# Patient Record
Sex: Male | Born: 1980 | Race: Black or African American | Hispanic: No | Marital: Single | Smoking: Never smoker
Health system: Southern US, Community
[De-identification: ages and names within clinical notes are randomized; demographics above are authoritative.]

## PROBLEM LIST (undated history)

## (undated) ENCOUNTER — Emergency Department (HOSPITAL_COMMUNITY): Admission: EM | Discharge status: Absent without leave | Payer: Self-pay | Source: Home / Self Care

---
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
## 2018-09-06 ENCOUNTER — Other Ambulatory Visit: Payer: Self-pay | Admitting: Internal Medicine

## 2018-09-06 DIAGNOSIS — E786 Lipoprotein deficiency: Secondary | ICD-10-CM

## 2018-09-06 DIAGNOSIS — Z0000 Encounter for general adult medical examination without abnormal findings: Secondary | ICD-10-CM

## 2018-10-03 ENCOUNTER — Ambulatory Visit (INDEPENDENT_AMBULATORY_CARE_PROVIDER_SITE_OTHER): Payer: Self-pay

## 2018-10-03 ENCOUNTER — Encounter (HOSPITAL_COMMUNITY): Payer: Self-pay

## 2018-10-03 ENCOUNTER — Ambulatory Visit (HOSPITAL_COMMUNITY)
Admission: EM | Admit: 2018-10-03 | Discharge: 2018-10-03 | Disposition: A | Discharge status: Home / Self Care | Payer: Self-pay | Attending: Emergency Medicine | Admitting: Emergency Medicine

## 2018-10-03 DIAGNOSIS — S6992XA Unspecified injury of left wrist, hand and finger(s), initial encounter: Secondary | ICD-10-CM

## 2018-10-03 MED ORDER — IBUPROFEN 800 MG PO TABS: 800.0000 mg | ORAL_TABLET | Freq: Three times a day (TID) | ORAL | 0 refills | Status: DC | PRN

## 2018-10-03 MED ORDER — BACITRACIN ZINC 500 UNIT/GM EX OINT
TOPICAL_OINTMENT | CUTANEOUS | Status: AC
  Filled 2018-10-03: qty 28.35

## 2018-10-03 NOTE — ED Triage Notes (Signed)
Patient reports he was palying Frisbee and he injured the 4th and 5th digit of his left hand.

## 2018-10-03 NOTE — Discharge Instructions (Signed)
Take ibuprofen as needed for your discomfort.    Keep your wound clean and dry.  Wash it gently twice a day with soap and water.  Apply an antibiotic ointment and a bandage.    Call the orthopedic listed to schedule an appointment for evaluation of your fingers.

## 2018-10-03 NOTE — ED Provider Notes (Signed)
[facility]    CSN: [id number] Arrival date & time: 10/03/18  1830      History   Chief Complaint Chief Complaint  Patient presents with  . Finger Injury    HPI Nathaniel Hanson is a 38 y.o. male.   Patient presents with injury to his left fourth and fifth fingers which occurred while playing Frisbee this afternoon.  He states he fell and landed on his fingers.  He has a previous injury to the left fifth finger which occurred in March 2020 and left his DIP joint deformed.  He denies numbness, tingling, weakness in his fingers.  He denies head injury or loss of consciousness.  The history is provided by the patient.    History reviewed. No pertinent past medical history.  There are no active problems to display for this patient.   History reviewed. No pertinent surgical history.     Home Medications    Prior to Admission medications   Medication Sig Start Date End Date Taking? Authorizing Provider  ibuprofen (ADVIL) 800 MG tablet Take 1 tablet (800 mg total) by mouth every 8 (eight) hours as needed. 10/03/18   Sharion Balloon, NP    Family History Family History  Problem Relation Age of Onset  . Cancer Mother     Social History Social History   Tobacco Use  . Smoking status: Never Smoker  . Smokeless tobacco: Never Used  Substance Use Topics  . Alcohol use: Not on file  . Drug use: Not on file     Allergies   Patient has no allergy information on record.   Review of Systems Review of Systems  Constitutional: Negative for chills and fever.  HENT: Negative for ear pain and sore throat.   Eyes: Negative for pain and visual disturbance.  Respiratory: Negative for cough and shortness of breath.   Cardiovascular: Negative for chest pain and palpitations.  Gastrointestinal: Negative for abdominal pain and vomiting.  Genitourinary: Negative for dysuria and hematuria.  Musculoskeletal: Positive for arthralgias. Negative for back pain.  Skin: Positive  for wound. Negative for color change and rash.  Neurological: Negative for seizures, syncope, weakness and numbness.  All other systems reviewed and are negative.    Physical Exam Triage Vital Signs ED Triage Vitals  Enc Vitals Group     BP 10/03/18 1849 119/82     Pulse Rate 10/03/18 1849 98     Resp 10/03/18 1849 16     Temp 10/03/18 1849 (!) 97.5 F (36.4 C)     Temp Source 10/03/18 1849 Temporal     SpO2 10/03/18 1849 95 %     Weight --      Height --      Head Circumference --      Peak Flow --      Pain Score 10/03/18 1846 5     Pain Loc --      Pain Edu? --      Excl. in [place]? --    No data found.  Updated Vital Signs BP 119/82 (BP Location: Right Arm)   Pulse 98   Temp (!) 97.5 F (36.4 C) (Temporal)   Resp 16   SpO2 95%   Visual Acuity Right Eye Distance:   Left Eye Distance:   Bilateral Distance:    Right Eye Near:   Left Eye Near:    Bilateral Near:     Physical Exam Vitals signs and nursing note reviewed.  Constitutional:  Appearance: He is well-developed.  HENT:     Head: Normocephalic and atraumatic.  Eyes:     Conjunctiva/sclera: Conjunctivae normal.  Neck:     Musculoskeletal: Neck supple.  Cardiovascular:     Rate and Rhythm: Normal rate and regular rhythm.     Heart sounds: No murmur.  Pulmonary:     Effort: Pulmonary effort is normal. No respiratory distress.     Breath sounds: Normal breath sounds.  Abdominal:     Palpations: Abdomen is soft.     Tenderness: There is no abdominal tenderness.  Musculoskeletal:        General: Tenderness and deformity present.     Comments: Deformity of left 5th finger DIP which patient states is from previous injury.  See picture for details.   Skin:    General: Skin is warm and dry.     Capillary Refill: Capillary refill takes less than 2 seconds.  Neurological:     General: No focal deficit present.     Mental Status: He is alert and oriented to person, place, and time.     Sensory: No  sensory deficit.          UC Treatments / Results  Labs (all labs ordered are listed, but only abnormal results are displayed) Labs Reviewed - No data to display  EKG   Radiology No results found.  Procedures Procedures (including critical care time)  Medications Ordered in UC Medications - No data to display  Initial Impression / Assessment and Plan / UC Course  I have reviewed the triage vital signs and the nursing notes.  Pertinent labs & imaging results that were available during my care of the patient were reviewed by me and considered in my medical decision making (see chart for details).    Injury of the left fourth and fifth fingers.  X-ray of the left hand showed no new dislocations or fractures.  Treating with ibuprofen.  Discussed wound care and signs of infection with patient.  Instructed patient to call orthopedics to schedule appointment for evaluation.  Patient agrees with plan of care.     Final Clinical Impressions(s) / UC Diagnoses   Final diagnoses:  Injury of finger of left hand, initial encounter     Discharge Instructions     Take ibuprofen as needed for your discomfort.    Keep your wound clean and dry.  Wash it gently twice a day with soap and water.  Apply an antibiotic ointment and a bandage.    Call the orthopedic listed to schedule an appointment for evaluation of your fingers.        ED Prescriptions    Medication Sig Dispense Auth. Provider   ibuprofen (ADVIL) 800 MG tablet Take 1 tablet (800 mg total) by mouth every 8 (eight) hours as needed. 21 tablet Mickie Bailate, Miqueas Whilden H, NP     Controlled Substance Prescriptions [place] Controlled Substance Registry consulted? Not Applicable   Mickie Bailate, Soren Pigman H, NP 10/03/18 [phone]

## 2018-10-08 ENCOUNTER — Other Ambulatory Visit: Payer: Self-pay

## 2018-10-08 ENCOUNTER — Encounter: Payer: Self-pay | Admitting: Physician Assistant

## 2018-10-08 ENCOUNTER — Ambulatory Visit (INDEPENDENT_AMBULATORY_CARE_PROVIDER_SITE_OTHER): Payer: Self-pay | Admitting: Physician Assistant

## 2018-10-08 VITALS — Ht 74.0 in | Wt 254.0 lb

## 2018-10-08 DIAGNOSIS — M79645 Pain in left finger(s): Secondary | ICD-10-CM

## 2018-10-08 DIAGNOSIS — M20012 Mallet finger of left finger(s): Secondary | ICD-10-CM

## 2018-10-08 NOTE — Progress Notes (Signed)
   Office Visit Note   Patient: Nathaniel Hanson           Date of Birth: April 30, 1980           MRN: [id number] Visit Date: 10/08/2018              Requested by: No referring provider defined for this encounter. PCP: Patient, No Pcp Per   Assessment & Plan: Visit Diagnoses:  1. Finger pain, left   2. Mallet deformity of left little finger     Plan: We will place in an splint which he is to keep on except for hygiene purposes.  During the washing of the hand he needs to keep the finger straight using his other hand if wrist and down against surface.  Will refer him to a hand surgeon here in town to see what can be done for his left chronic mallet finger.  Questions were encouraged and answered.  Follow-Up Instructions: Return if symptoms worsen or fail to improve.   Orders:  Orders Placed This Encounter  Procedures  . Ambulatory referral to Hand Surgery   No orders of the defined types were placed in this encounter.     Procedures: No procedures performed   Clinical Data: No additional findings.   Subjective: Chief Complaint  Patient presents with  . Left Hand - Injury    Initial injury in March 2020    HPI Nathaniel Hanson comes in today due to left fifth finger injury.  He states he injured his finger when he caught a ball without a glove in March and injured his finger.  He states that the finger healed in a bent position.  He was unable to seek medical attention for it due to the COVID-19 outbreak.  He has been this mainly dealing with the finger until he was playing [employer] this past Wednesday and reinjured it.  He went to [facility] urgent care where radiographs of the finger were obtained on 10/03/2018.  I personally reviewed these.  Instructed no acute fracture noted apparent bony injury.  Review of Systems Negative for fevers chills.  See HPI otherwise negative.  Objective: Vital Signs: Ht 6\' 2"  (1.88 m)   Wt 254 lb (115.2 kg)   BMI 32.61 kg/m   Physical Exam General:  Well-developed well-nourished pleasant male in no acute distress. Psych: Alert and oriented x3 Ortho Exam Left hand is range of motion of the fingers except for the left fifth finger but she has flexion contracture consistent with a mallet finger and inability to bring the finger out the full extension through the DIP joint.  No malrotation.  Passively I can bring the left fifth finger to full extension.  He has minimal discomfort.  Some skin abrasion about the nail but no apparent infection. Specialty Comments:  No specialty comments available.  Imaging: No results found.   PMFS History: There are no active problems to display for this patient.  History reviewed. No pertinent past medical history.  Family History  Problem Relation Age of Onset  . Cancer Mother     History reviewed. No pertinent surgical history. Social History   Occupational History  . Not on file  Tobacco Use  . Smoking status: Never Smoker  . Smokeless tobacco: Never Used  Substance and Sexual Activity  . Alcohol use: Not on file  . Drug use: Not on file  . Sexual activity: Not on file

## 2018-10-18 ENCOUNTER — Other Ambulatory Visit: Payer: Self-pay

## 2019-05-29 ENCOUNTER — Ambulatory Visit (HOSPITAL_COMMUNITY)
Admission: RE | Admit: 2019-05-29 | Discharge: 2019-05-29 | Disposition: A | Discharge status: Home / Self Care | Payer: Self-pay | Source: Ambulatory Visit | Attending: Family Medicine | Admitting: Family Medicine

## 2019-05-29 ENCOUNTER — Ambulatory Visit (HOSPITAL_COMMUNITY)
Admission: EM | Admit: 2019-05-29 | Discharge: 2019-05-29 | Disposition: A | Discharge status: Home / Self Care | Payer: Self-pay | Attending: Family Medicine | Admitting: Family Medicine

## 2019-05-29 ENCOUNTER — Other Ambulatory Visit: Payer: Self-pay

## 2019-05-29 ENCOUNTER — Encounter (HOSPITAL_COMMUNITY): Payer: Self-pay

## 2019-05-29 DIAGNOSIS — M79609 Pain in unspecified limb: Secondary | ICD-10-CM

## 2019-05-29 DIAGNOSIS — M7989 Other specified soft tissue disorders: Secondary | ICD-10-CM | POA: Insufficient documentation

## 2019-05-29 DIAGNOSIS — M79662 Pain in left lower leg: Secondary | ICD-10-CM | POA: Insufficient documentation

## 2019-05-29 NOTE — Progress Notes (Signed)
Left lower extremity venous duplex completed. Refer to "CV Proc" under chart review to view preliminary results.  05/29/2019 12:19 PM Eula Fried., MHA, RVT, RDCS, RDMS

## 2019-05-29 NOTE — ED Provider Notes (Signed)
[place]    CSN: [id number] Arrival date & time: 05/29/19  2458      History   Chief Complaint Chief Complaint  Patient presents with  . Leg Pain    HPI Nathaniel Hanson is a 39 y.o. male.   Patient is a 39 year old male with no significant past medical history.  He presents today with left leg pain, swelling and calf area.  The pain radiates into posterior knee and up to posterior thigh.  Started approximately 2 weeks ago after playing basketball.  Denies any specific injury at that time.  Has been trying to rest but the pain is not improving and the swelling is worsening.  There is some generalized erythema to the calf area.  No history of DVT, PE.  No recent traveling.  No fever, chills, body aches or night sweats.  Had Covid in March.  No recent vaccinations.  Has not been taking any medication for symptoms.  ROS per HPI      History reviewed. No pertinent past medical history.  There are no problems to display for this patient.   History reviewed. No pertinent surgical history.     Home Medications    Prior to Admission medications   Medication Sig Start Date End Date Taking? Authorizing Provider  ibuprofen (ADVIL) 800 MG tablet Take 1 tablet (800 mg total) by mouth every 8 (eight) hours as needed. 10/03/18   Sharion Balloon, NP    Family History Family History  Problem Relation Age of Onset  . Cancer Mother     Social History Social History   Tobacco Use  . Smoking status: Never Smoker  . Smokeless tobacco: Never Used  Substance Use Topics  . Alcohol use: Not Currently  . Drug use: Not on file     Allergies   Patient has no known allergies.   Review of Systems Review of Systems   Physical Exam Triage Vital Signs ED Triage Vitals  Enc Vitals Group     BP 05/29/19 1001 101/71     Pulse Rate 05/29/19 1001 86     Resp 05/29/19 1001 14     Temp 05/29/19 1001 98.4 F (36.9 C)     Temp Source 05/29/19 1001 Oral     SpO2 05/29/19  1001 99 %     Weight --      Height --      Head Circumference --      Peak Flow --      Pain Score 05/29/19 1000 8     Pain Loc --      Pain Edu? --      Excl. in [place]? --    No data found.  Updated Vital Signs BP 101/71 (BP Location: Left Arm)   Pulse 86   Temp 98.4 F (36.9 C) (Oral)   Resp 14   SpO2 99%   Visual Acuity Right Eye Distance:   Left Eye Distance:   Bilateral Distance:    Right Eye Near:   Left Eye Near:    Bilateral Near:     Physical Exam Vitals and nursing note reviewed.  Constitutional:      Appearance: Normal appearance.  HENT:     Head: Normocephalic and atraumatic.     Nose: Nose normal.  Eyes:     Conjunctiva/sclera: Conjunctivae normal.  Pulmonary:     Effort: Pulmonary effort is normal.  Musculoskeletal:        General: Normal range of motion.  Cervical back: Normal range of motion.     Left lower leg: Edema present.     Comments: Moderate swelling, erythema and very tender to palpation of left lower extremity/calf area and posterior knee.   Skin:    General: Skin is warm and dry.  Neurological:     Mental Status: He is alert.  Psychiatric:        Mood and Affect: Mood normal.      UC Treatments / Results  Labs (all labs ordered are listed, but only abnormal results are displayed) Labs Reviewed - No data to display  EKG   Radiology LE VENOUS  Result Date: 05/29/2019  Lower Venous DVTStudy Indications: Swelling, and Pain.  Performing Technologist: Gertie Fey MHA, RDMS, RVT, RDCS  Examination Guidelines: A complete evaluation includes B-mode imaging, spectral Doppler, color Doppler, and power Doppler as needed of all accessible portions of each vessel. Bilateral testing is considered an integral part of a complete examination. Limited examinations for reoccurring indications may be performed as noted. The reflux portion of the exam is performed with the patient in reverse Trendelenburg.   +-----+---------------+---------+-----------+----------+--------------+ RIGHTCompressibilityPhasicitySpontaneityPropertiesThrombus Aging +-----+---------------+---------+-----------+----------+--------------+ CFV  Full           Yes      Yes                                 +-----+---------------+---------+-----------+----------+--------------+   +---------+---------------+---------+-----------+----------+--------------+ LEFT     CompressibilityPhasicitySpontaneityPropertiesThrombus Aging +---------+---------------+---------+-----------+----------+--------------+ CFV      Full           Yes      Yes                                 +---------+---------------+---------+-----------+----------+--------------+ SFJ      Full                                                        +---------+---------------+---------+-----------+----------+--------------+ FV Prox  Full                                                        +---------+---------------+---------+-----------+----------+--------------+ FV Mid   Full                                                        +---------+---------------+---------+-----------+----------+--------------+ FV DistalFull                                                        +---------+---------------+---------+-----------+----------+--------------+ PFV      Full                                                        +---------+---------------+---------+-----------+----------+--------------+  POP      Full           Yes      Yes                                 +---------+---------------+---------+-----------+----------+--------------+ PTV      Full                                                        +---------+---------------+---------+-----------+----------+--------------+ PERO     Full                                                         +---------+---------------+---------+-----------+----------+--------------+     Summary: RIGHT: - No evidence of common femoral vein obstruction.  LEFT: - There is no evidence of deep vein thrombosis in the lower extremity.  - No cystic structure found in the popliteal fossa.  *See table(s) above for measurements and observations.    Preliminary     Procedures Procedures (including critical care time)  Medications Ordered in UC Medications - No data to display  Initial Impression / Assessment and Plan / UC Course  I have reviewed the triage vital signs and the nursing notes.  Pertinent labs & imaging results that were available during my care of the patient were reviewed by me and considered in my medical decision making (see chart for details).     Left calf pain and swelling There is some concern for DVT today.  Sending to hospital for ultrasound to rule out DVT  DVT study negative. Patient aware of results. We will go with previous plan as discussed with patient what if results were negative.  Final Clinical Impressions(s) / UC Diagnoses   Final diagnoses:  Pain of left calf     Discharge Instructions     Please go for an ultrasound across the street at the hospital.  You would go to entrance see [facility] and check in. They will call me with report and we will treat as needed If the testing is negative for a blood clot I am giving you a contact for [facility] for you to follow-up with them.  You will need to call them today If this is truly a torn calf you will need to place an Ace wrap on the calf area and rest, ice the area and take ibuprofen for pain as needed. If this is a blood clot you will need to be started on blood thinners.  We will direct you for what to do after your test is done    ED Prescriptions    None     PDMP not reviewed this encounter.   Janace Aris, NP 05/29/19 1341

## 2019-05-29 NOTE — ED Triage Notes (Signed)
C/o left leg pain. Reports he was playing basketball two weeks ago when the pain started.

## 2019-05-29 NOTE — Discharge Instructions (Addendum)
Please go for an ultrasound across the street at the hospital.  You would go to entrance see [facility] and check in. They will call me with report and we will treat as needed If the testing is negative for a blood clot I am giving you a contact for [facility] for you to follow-up with them.  You will need to call them today If this is truly a torn calf you will need to place an Ace wrap on the calf area and rest, ice the area and take ibuprofen for pain as needed. If this is a blood clot you will need to be started on blood thinners.  We will direct you for what to do after your test is done

## 2019-05-31 ENCOUNTER — Encounter: Payer: Self-pay | Admitting: Sports Medicine

## 2019-05-31 ENCOUNTER — Other Ambulatory Visit: Payer: Self-pay

## 2019-05-31 ENCOUNTER — Ambulatory Visit (INDEPENDENT_AMBULATORY_CARE_PROVIDER_SITE_OTHER): Payer: Self-pay | Admitting: Sports Medicine

## 2019-05-31 VITALS — BP 112/72 | Ht 74.0 in

## 2019-05-31 DIAGNOSIS — S86119A Strain of other muscle(s) and tendon(s) of posterior muscle group at lower leg level, unspecified leg, initial encounter: Secondary | ICD-10-CM | POA: Insufficient documentation

## 2019-05-31 DIAGNOSIS — S86112A Strain of other muscle(s) and tendon(s) of posterior muscle group at lower leg level, left leg, initial encounter: Secondary | ICD-10-CM

## 2019-05-31 NOTE — Assessment & Plan Note (Addendum)
Patient presenting with left medial gastrocnemius strain with partial rupture.  -OTC NSAIDs -Note for work -Compression sleeve  -F/u in two weeks

## 2019-05-31 NOTE — Progress Notes (Addendum)
    SUBJECTIVE:   CHIEF COMPLAINT / HPI:   Patient presents with 2 weeks of left calf, posterior knee pain. He was playing basketball when he felt a "cramp" in his left leg. Does not know of a specific time of injury. Pain is worse with flexion of the knee. Patient was seen in urgent care approximately 2 days ago where he was evaluated for DVT which was negative. Patient has been taking OTC NSAIDs for relief, but they do not help very much with the pain. Patient works as a Theme park manager at [employer].   OBJECTIVE:   BP 112/72   Ht 6\' 2"  (1.88 m)   BMI 32.61 kg/m   Left leg Inspection: No gross effusion Palpation: Tender to palpation along the left lateral hamstring lateral knee ranging down to proximal one third of calf ROM: 0 to 130 degrees Strength: 5/5 right lower extremity throughout, 4/5 left knee flexion, limited let plantar flexion Stability: Joint stable without gross laxity Special tests: Negative valgus/varus, negative McMurray's, negative Lachman's, negative anterior/posterior drawer Vascular studies:NVI   ASSESSMENT/PLAN:   Gastrocnemius muscle rupture Patient presenting with left medial gastrocnemius strain with partial rupture.  -OTC NSAIDs -Note for work -Compression sleeve  -F/u in two weeks     , MD [facility]   I was the preceptor for this visit and available for immediate consultation [facility], DO

## 2019-05-31 NOTE — Patient Instructions (Signed)
The pain in your leg is caused by a tear to your calf muscle. -Use the crutches as needed for the next several weeks as you may need them to help with walking due to the pain -Use the compression sleeve with activity.  This will help with the swelling and some discomfort you are having -You may take over-the-counter anti-inflammatories as needed for pain -I have given you a note to be off of work for 2 weeks.  I would like to see you back in 2 weeks to repeat your evaluation.  If your pain improved significantly before then and you would like to try to go back to work, please give our office a call and we can give you another note

## 2019-06-12 ENCOUNTER — Ambulatory Visit (INDEPENDENT_AMBULATORY_CARE_PROVIDER_SITE_OTHER): Payer: Self-pay | Admitting: Sports Medicine

## 2019-06-12 ENCOUNTER — Encounter: Payer: Self-pay | Admitting: Sports Medicine

## 2019-06-12 ENCOUNTER — Other Ambulatory Visit: Payer: Self-pay

## 2019-06-12 VITALS — BP 128/74 | Ht 75.0 in | Wt 220.0 lb

## 2019-06-12 DIAGNOSIS — S86112D Strain of other muscle(s) and tendon(s) of posterior muscle group at lower leg level, left leg, subsequent encounter: Secondary | ICD-10-CM

## 2019-06-12 NOTE — Progress Notes (Addendum)
PCP: Creola Corn, MD  Subjective:   HPI: Patient is a 39 y.o. male here for follow-up of left medial gastroc muscle partial tear.  Patient is now about 2 and half weeks out from the injury.  Ultrasound his last visit showed partial tearing of the medial gastroc muscle belly.  Patient notes slight improvement since his last visit.  He is now down to using 1 crutch from 2 crutches.  The compression sleeve that we tried to fit him for the last visit did not fit him very well so he has not been wearing one.  Patient notes he started going back to work this week.  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  Work is very difficult but he would like to try to power through it if he can not miss any additional days of work.  The pain does not radiate.  She denies any numbness or tingling.  He has some swelling but no bruising.   Review of Systems: See HPI above.  History reviewed. No pertinent past medical history.  Current Outpatient Medications on File Prior to Visit  Medication Sig Dispense Refill  . ibuprofen (ADVIL) 800 MG tablet Take 1 tablet (800 mg total) by mouth every 8 (eight) hours as needed. 21 tablet 0   No current facility-administered medications on file prior to visit.    History reviewed. No pertinent surgical history.  No Known Allergies  Social History   Socioeconomic History  . Marital status: Single    Spouse name: Not on file  . Number of children: Not on file  . Years of education: Not on file  . Highest education level: Not on file  Occupational History  . Not on file  Tobacco Use  . Smoking status: Never Smoker  . Smokeless tobacco: Never Used  Substance and Sexual Activity  . Alcohol use: Not Currently  . Drug use: Not on file  . Sexual activity: Not on file  Other Topics Concern  . Not on file  Social History Narrative  . Not on file   Social Determinants of Health   Financial Resource Strain:   . Difficulty  of Paying Living Expenses:   Food Insecurity:   . Worried About Programme researcher, broadcasting/film/video in the Last Year:   . Barista in the Last Year:   Transportation Needs:   . Freight forwarder (Medical):   Marland Kitchen Lack of Transportation (Non-Medical):   Physical Activity:   . Days of Exercise per Week:   . Minutes of Exercise per Session:   Stress:   . Feeling of Stress :   Social Connections:   . Frequency of Communication with Friends and Family:   . Frequency of Social Gatherings with Friends and Family:   . Attends Religious Services:   . Active Member of Clubs or Organizations:   . Attends Banker Meetings:   Marland Kitchen Marital Status:   Intimate Partner Violence:   . Fear of Current or Ex-Partner:   . Emotionally Abused:   Marland Kitchen Physically Abused:   . Sexually Abused:     Family History  Problem Relation Age of Onset  . Cancer Mother         Objective:  Physical Exam: BP 128/74   Ht 6\' 3"  (1.905 m)   Wt 220 lb (99.8 kg)   BMI 27.50 kg/m  Gen: NAD, comfortable in exam room Lungs: Breathing comfortably  on room air Left lower extremity -No bruising, deformity, mild swelling at the medial head of the gastroc -Mild tenderness palpation of the medial head of the gastroc -Normal range of motion of the ankle in all planes -Normal strength of the ankle in all directions.  Some pain with plantarflexion -Left lower extremity neurovascularly intact   Assessment & Plan:  Patient is a 39 y.o. male here for follow-up of left medial gastroc tear  1.  Partial tear of the left medial gastroc -Patient doing well since his last visit.  Still having pain however this is improving.  He is now down to 1 crutch from previously using 2 crutches -Patient was offered a work note to excuse him from work the remainder of the week however he states he would like to power through this week and not take additional time off. -Patient given concentric strengthening exercises -Patient advised that  he could try to find a compression sleeve off of [place] by [employer] or get one at the pharmacy if you would like.  Unfortunately the body helix compression sleeves did not work well for him  Patient will follow up in 3 weeks for repeat evaluation.  We will likely add eccentric strengthening at that time if he is able.  Addendum:  I was the preceptor for this visit and available for immediate consultation.  Karlton Lemon MD [facility] Boys

## 2019-06-12 NOTE — Patient Instructions (Signed)
The pain in your calf is caused by a partial tear of your calf muscle. -Work on the strengthening exercises shown to you at today's visit -If you would like to get a compression sleeve can order one off of [employer] to get one from the pharmacy.  Unfortunately ours did not seem to fit you very well -You may use the crutches as needed.  I would like you to try to wean off of them over the next several weeks -I will see back in about 3 weeks.  At that time we will reevaluate your symptoms and adjust your exercises at that time.  In the meantime if you find you are not able to do your job and you need a note for work, please not hesitate to contact me.

## 2019-06-18 ENCOUNTER — Other Ambulatory Visit: Payer: Self-pay

## 2019-06-18 ENCOUNTER — Encounter (HOSPITAL_COMMUNITY): Payer: Self-pay

## 2019-06-18 ENCOUNTER — Emergency Department (HOSPITAL_COMMUNITY)
Admission: EM | Admit: 2019-06-18 | Discharge: 2019-06-19 | Disposition: A | Discharge status: Home / Self Care | Payer: Self-pay | Attending: Emergency Medicine | Admitting: Emergency Medicine

## 2019-06-18 DIAGNOSIS — M25552 Pain in left hip: Secondary | ICD-10-CM | POA: Insufficient documentation

## 2019-06-18 DIAGNOSIS — Z79899 Other long term (current) drug therapy: Secondary | ICD-10-CM | POA: Insufficient documentation

## 2019-06-18 DIAGNOSIS — T148XXA Other injury of unspecified body region, initial encounter: Secondary | ICD-10-CM

## 2019-06-18 MED ORDER — OXYCODONE-ACETAMINOPHEN 5-325 MG PO TABS
1.0000 | ORAL_TABLET | ORAL | Status: DC | PRN
  Administered 2019-06-18: 1 via ORAL
  Filled 2019-06-18: qty 1

## 2019-06-18 NOTE — ED Triage Notes (Addendum)
Patient arrived stating he tore his calf on the 5/11. Reporting the pain has increased today and has some swelling. States he has been taking Advil and Ibuprofen at home with little relief. States he began having pain in his tailbone.

## 2019-06-18 NOTE — ED Provider Notes (Signed)
[facility]-EMERGENCY DEPT Provider Note   CSN: [id number] Arrival date & time: 06/18/19  2102     History Chief Complaint  Patient presents with  . Leg Pain    Nathaniel Hanson is a 39 y.o. male.  HPI He presents for evaluation of pain in the left hip region, which started yesterday.  No recent trauma.  He injured his left calf, muscle tear, 3 weeks ago.  He also strained his hamstring at that time.  This occurred while he was playing basketball, with nonspecific point of injury.  He has been seeing a sports medicine physician who has been recommending symptomatic treatment, with stretching, and OTC analgesia.  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  He did not work yesterday.  Today he was unable to do much work, because of the pain.  He denies fever, loss of bowel or bladder function, inability to ambulate or back pain.  There are no other known modifying factors.    History reviewed. No pertinent past medical history.  Patient Active Problem List   Diagnosis Date Noted  . 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 No pertinent surgical history.     Family History  Problem Relation Age of Onset  . Cancer Mother     Social History   Tobacco Use  . Smoking status: Never Smoker  . Smokeless tobacco: Never Used  Substance Use Topics  . Alcohol use: Not Currently  . Drug use: Not on file    Home Medications Prior to Admission medications   Medication Sig Start Date End Date Taking? Authorizing Provider  diazepam (VALIUM) 5 MG tablet Take 1 tablet (5 mg total) by mouth every 6 (six) hours as needed for muscle spasms (spasms). 06/19/19   Mancel Bale, MD  HYDROcodone-acetaminophen (NORCO/VICODIN) 5-325 MG tablet Take 1 tablet by mouth every 4 (four) hours as needed for moderate pain. 06/19/19   Mancel Bale, MD  ibuprofen (ADVIL) 800 MG tablet Take 1 tablet (800 mg total) by mouth every 8 (eight) hours as needed. 10/03/18   Mickie Bail, NP    Allergies    Patient has no known allergies.  Review of Systems   Review of Systems  All other systems reviewed and are negative.   Physical Exam Updated Vital Signs BP (!) 142/87 (BP Location: Right Arm)   Pulse 90   Temp 98.4 F (36.9 C) (Oral)   Resp 16   Ht 6\' 3"  (1.905 m)   Wt 99.8 kg   SpO2 100%   BMI 27.50 kg/m   Physical Exam Vitals and nursing note reviewed.  Constitutional:      General: He is in acute distress (Uncomfortable).     Appearance: He is well-developed. He is not ill-appearing, toxic-appearing or diaphoretic.  HENT:     Head: Normocephalic and atraumatic.     Right Ear: External ear normal.     Left Ear: External ear normal.  Eyes:     Conjunctiva/sclera: Conjunctivae normal.     Pupils: Pupils are equal, round, and reactive to light.  Neck:     Trachea: Phonation normal.  Cardiovascular:     Rate and Rhythm: Normal rate.  Pulmonary:     Effort: Pulmonary effort is normal.  Abdominal:     General: There is no distension.  Musculoskeletal:     Cervical back: Normal range of motion and neck supple.     Comments: He walks with a left antalgic  gait.  He is tender in the left posterior pelvic region, and slightly tender over the left hip.  There is no pain of the left hip with internal and external rotation.  No tenderness of the anterior posterior thigh regions,  Skin:    General: Skin is warm and dry.  Neurological:     Mental Status: He is alert and oriented to person, place, and time.     Cranial Nerves: No cranial nerve deficit.     Sensory: No sensory deficit.     Motor: No abnormal muscle tone.     Coordination: Coordination normal.  Psychiatric:        Mood and Affect: Mood normal.        Behavior: Behavior normal.        Thought Content: Thought content normal.        Judgment: Judgment normal.     ED Results / Procedures / Treatments   Labs (all labs ordered are listed, but only abnormal results are  displayed) Labs Reviewed - No data to display  EKG None  Radiology No results found.  Procedures Procedures (including critical care time)  Medications Ordered in ED Medications  oxyCODONE-acetaminophen (PERCOCET/ROXICET) 5-325 MG per tablet 1 tablet (1 tablet Oral Given 06/18/19 2234)  oxyCODONE-acetaminophen (PERCOCET/ROXICET) 5-325 MG per tablet 1 tablet (has no administration in time range)  diazepam (VALIUM) tablet 5 mg (has no administration in time range)    ED Course  I have reviewed the triage vital signs and the nursing notes.  Pertinent labs & imaging results that were available during my care of the patient were reviewed by me and considered in my medical decision making (see chart for details).    MDM Rules/Calculators/A&P                       Patient Vitals for the past 24 hrs:  BP Temp Temp src Pulse Resp SpO2 Height Weight  06/18/19 2358 (!) 142/87 98.4 F (36.9 C) Oral 90 -- 100 % 6\' 3"  (1.905 m) 99.8 kg  06/18/19 2204 (!) 150/78 99.7 F (37.6 C) Oral 95 16 95 % 6\' 3"  (1.905 m) 99.8 kg    12:17 AM Reevaluation with update and discussion. After initial assessment and treatment, an updated evaluation reveals no change in status, findings discussed and questions answered. 2205   Medical Decision Making:  This patient is presenting for evaluation of left hip pain, which does require a range of treatment options, and is a complaint that involves a moderate risk of morbidity and mortality. The differential diagnoses include radicular pain, muscle strain, aggravation of prior injury. I decided to review old records, and in summary healthy young male, with a injury to the left leg, 3 weeks ago, now improving calf discomfort but newly worsening left hip pain.  I did not require additional historical information from anyone with.    Critical Interventions-clinical evaluation, observation reassessment  After These Interventions, the Patient was  reevaluated and was found to have pain likely from muscle strain, versus spasm.  Doubt fracture, radiculopathy or significant joint injury.  CRITICAL CARE-no Performed by:  Nursing Notes Reviewed/ Care Coordinated Applicable Imaging Reviewed Interpretation of Laboratory Data incorporated into ED treatment  The patient appears reasonably screened and/or stabilized for discharge and I doubt any other medical condition or other [facility] requiring further screening, evaluation, or treatment in the ED at this time prior to discharge.  Plan: Home Medications-routine OTC medications  as needed; Home Treatments-ice and heat to affected area; return here if the recommended treatment, does not improve the symptoms; Recommended follow up-sports medicine, as needed     Final Clinical Impression(s) / ED Diagnoses Final diagnoses:  None    Rx / DC Orders ED Discharge Orders         Ordered    HYDROcodone-acetaminophen (NORCO/VICODIN) 5-325 MG tablet  Every 4 hours PRN     06/19/19 0016    diazepam (VALIUM) 5 MG tablet  Every 6 hours PRN     06/19/19 0016           Daleen Bo, MD 06/19/19 0018

## 2019-06-19 MED ORDER — DIAZEPAM 5 MG PO TABS
5.0000 mg | ORAL_TABLET | Freq: Once | ORAL | Status: AC
  Administered 2019-06-19: 5 mg via ORAL
  Filled 2019-06-19: qty 1

## 2019-06-19 MED ORDER — DIAZEPAM 5 MG PO TABS
5.0000 mg | ORAL_TABLET | Freq: Four times a day (QID) | ORAL | 0 refills | Status: DC | PRN
Start: 2019-06-19 — End: 2020-07-20

## 2019-06-19 MED ORDER — OXYCODONE-ACETAMINOPHEN 5-325 MG PO TABS
1.0000 | ORAL_TABLET | Freq: Once | ORAL | Status: AC
  Administered 2019-06-19: 1 via ORAL
  Filled 2019-06-19: qty 1

## 2019-06-19 MED ORDER — HYDROCODONE-ACETAMINOPHEN 5-325 MG PO TABS: 1.0000 | ORAL_TABLET | ORAL | 0 refills | Status: DC | PRN

## 2019-06-19 NOTE — Discharge Instructions (Signed)
Follow-up with your sports medicine doctor if not better in 3 or 4 days.  Use ice on the sore areas 3 or 4 times a day for 1 to 2 days after that use heat in the same pattern.  Do not drive or work when using the narcotic pain reliever or muscle relaxer.

## 2019-06-24 ENCOUNTER — Encounter: Payer: Self-pay | Admitting: Family Medicine

## 2019-06-24 ENCOUNTER — Ambulatory Visit (INDEPENDENT_AMBULATORY_CARE_PROVIDER_SITE_OTHER): Payer: Self-pay | Admitting: Family Medicine

## 2019-06-24 ENCOUNTER — Other Ambulatory Visit: Payer: Self-pay

## 2019-06-24 DIAGNOSIS — S86112A Strain of other muscle(s) and tendon(s) of posterior muscle group at lower leg level, left leg, initial encounter: Secondary | ICD-10-CM

## 2019-06-24 DIAGNOSIS — M25552 Pain in left hip: Secondary | ICD-10-CM

## 2019-06-24 NOTE — Progress Notes (Signed)
Nathaniel Hanson is a 39 y.o. male who presents to [facility] today for the following:  Left hip pain Patient reporting left hip pain which is new.  Patient reports that pain began last Tuesday.  He woke up in the morning with this pain.  Did not do anything to injure it or workout excessively the day before. Does sleep with his calf elevated secondary to his calf tear. Try to go to work.  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  Around 7 PM he cannot stand the pain and tries to lay down.  When he continues to have pain with laying down he went to the urgent care.  He was given Valium and hydrocodone which she states helped.  Has not had pain since.    F/u left calf pain Patient presenting for follow-up of left medial gastroc muscle partial tear.  Patient was first seen in the beginning of May for this.  Patient reports pain is still present especially when he stands for over 5 minutes.  Has not been using crutches but started to use them again last week secondary to his hip pain.  Has been using a compression sleeve which she started on Friday.  That helps some.  Has started to do rehab but had to stop secondary to hip pain.  PMH reviewed. Gastrocnemius muscle rupture ROS as above. Medications reviewed.  Exam:  BP 130/82   Ht 6\' 3"  (1.905 m)   Wt 218 lb (98.9 kg)   BMI 27.25 kg/m  Gen: Well NAD MSK: Hip:  - Inspection: No gross deformity, no swelling, erythema, or ecchymosis - Palpation: No TTP, specifically none over greater trochanter - ROM: Normal range of motion on Flexion, extension, abduction, internal and external rotation. Pain with flexion and extension  - Strength: Decreased adduction, flexion, extension, abduction  - Neuro/vasc: NV intact distally - Special Tests: Negative FABER and FADIR.  LLE - inspection: no bruising, deformity, edema - palpation: no TTP of medial gastroc - ROM: limited ROM in foot dorsiflexion  - strength: decreased strength  with plantarflexion  - neuro/vasc: NV intact distally   LE VENOUS  Result Date: 05/29/2019  Lower Venous DVTStudy Indications: Swelling, and Pain.  Performing Technologist: 07/29/2019 MHA, RDMS, RVT, RDCS  Examination Guidelines: A complete evaluation includes B-mode imaging, spectral Doppler, color Doppler, and power Doppler as needed of all accessible portions of each vessel. Bilateral testing is considered an integral part of a complete examination. Limited examinations for reoccurring indications may be performed as noted. The reflux portion of the exam is performed with the patient in reverse Trendelenburg.  +-----+---------------+---------+-----------+----------+--------------+ RIGHTCompressibilityPhasicitySpontaneityPropertiesThrombus Aging +-----+---------------+---------+-----------+----------+--------------+ CFV  Full           Yes      Yes                                 +-----+---------------+---------+-----------+----------+--------------+   +---------+---------------+---------+-----------+----------+--------------+ LEFT     CompressibilityPhasicitySpontaneityPropertiesThrombus Aging +---------+---------------+---------+-----------+----------+--------------+ CFV      Full           Yes      Yes                                 +---------+---------------+---------+-----------+----------+--------------+ SFJ      Full                                                        +---------+---------------+---------+-----------+----------+--------------+  FV Prox  Full                                                        +---------+---------------+---------+-----------+----------+--------------+ FV Mid   Full                                                        +---------+---------------+---------+-----------+----------+--------------+ FV DistalFull                                                         +---------+---------------+---------+-----------+----------+--------------+ PFV      Full                                                        +---------+---------------+---------+-----------+----------+--------------+ POP      Full           Yes      Yes                                 +---------+---------------+---------+-----------+----------+--------------+ PTV      Full                                                        +---------+---------------+---------+-----------+----------+--------------+ PERO     Full                                                        +---------+---------------+---------+-----------+----------+--------------+     Summary: RIGHT: - No evidence of common femoral vein obstruction.  LEFT: - There is no evidence of deep vein thrombosis in the lower extremity.  - No cystic structure found in the popliteal fossa.  *See table(s) above for measurements and observations. Electronically signed by Gretta Began MD on 05/29/2019 at 7:30:47 PM.    Final      Assessment and Plan: 1) Gastrocnemius muscle rupture Patient with continued pain after excessive standing.  Continues to have weakness with foot plantarflexion.  Anticipate pain will improve with rehab but unfortunately he was unable to do it secondary to pain.  Now that hip pain is improved hopefully he can incorporate more rehab.  Advised to continue to wear compression sleeve.  Follow-up in 4 weeks.  Left hip pain Patient with left hip pain.  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  Does have some pain and weakness with flexion and extension of hip.  Pain is on the lateral aspect of his hip. Advised strengthening exercises to avoid  exacerbation. Pain is resolved now which is re-assuring. Can use NSAIDs for pain relief PRN.    Dalphine Handing, PGY-3 [facility] Resident 06/24/2019 3:48 PM

## 2019-06-24 NOTE — Assessment & Plan Note (Signed)
Patient with continued pain after excessive standing.  Continues to have weakness with foot flexion.  Anticipate pain will improve with rehab but unfortunately he was unable to do it secondary to pain.  Now that hip pain is improved hopefully he can incorporate more rehab.  Advised to continue to wear compression sleeve.  Follow-up in 4 weeks.

## 2019-06-24 NOTE — Patient Instructions (Signed)
The pain in your calf is caused by a partial tear of your calf muscle. -Work on the strengthening exercises - these are very important -Continue with the compression sleeve  Your hip pain is consistent with IT band syndrome which has improved. Do home stretches as directed for the next 3-4 weeks and hip side raises 3 sets of 10 once a day. Follow up with [place] in about 1 month for reevaluation

## 2019-06-24 NOTE — Assessment & Plan Note (Addendum)
Patient with left hip pain.  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  Does have some pain and weakness with flexion and extension of hip.  Pain is on the lateral aspect of his hip. Advised strengthening exercises to avoid exacerbation. Pain is resolved now which is re-assuring. Can use NSAIDs for pain relief PRN.

## 2019-07-03 ENCOUNTER — Ambulatory Visit: Payer: Self-pay | Admitting: Family Medicine

## 2019-07-24 ENCOUNTER — Ambulatory Visit (INDEPENDENT_AMBULATORY_CARE_PROVIDER_SITE_OTHER): Payer: Self-pay | Admitting: Family Medicine

## 2019-07-24 ENCOUNTER — Encounter: Payer: Self-pay | Admitting: Family Medicine

## 2019-07-24 ENCOUNTER — Other Ambulatory Visit: Payer: Self-pay

## 2019-07-24 VITALS — BP 102/74 | Ht 75.0 in | Wt 220.0 lb

## 2019-07-24 DIAGNOSIS — S86812D Strain of other muscle(s) and tendon(s) at lower leg level, left leg, subsequent encounter: Secondary | ICD-10-CM

## 2019-07-24 NOTE — Progress Notes (Signed)
PCP: Creola Corn, MD  Subjective:   HPI: Patient is a 39 y.o. male here for left calf pain.  6/7: Left hip pain Patient reporting left hip pain which is new.  Patient reports that pain began last Tuesday.  He woke up in the morning with this pain.  Did not do anything to injure it or workout excessively the day before. Does sleep with his calf elevated secondary to his calf tear. Try to go to work.  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  Around 7 PM he cannot stand the pain and tries to lay down.  When he continues to have pain with laying down he went to the urgent care.  He was given Valium and hydrocodone which she states helped.  Has not had pain since.    F/u left calf pain Patient presenting for follow-up of left medial gastroc muscle partial tear.  Patient was first seen in the beginning of May for this.  Patient reports pain is still present especially when he stands for over 5 minutes.  Has not been using crutches but started to use them again last week secondary to his hip pain.  Has been using a compression sleeve which she started on Friday.  That helps some.  Has started to do rehab but had to stop secondary to hip pain.  7/7: Patient reports he's doing very well. A little sore from doing leg exercises this week. Left calf feels great without any limitations. Left knee feels like it pops when he squats but no pain with this. No knee injuries.  History reviewed. No pertinent past medical history.  Current Outpatient Medications on File Prior to Visit  Medication Sig Dispense Refill  . diazepam (VALIUM) 5 MG tablet Take 1 tablet (5 mg total) by mouth every 6 (six) hours as needed for muscle spasms (spasms). 10 tablet 0  . HYDROcodone-acetaminophen (NORCO/VICODIN) 5-325 MG tablet Take 1 tablet by mouth every 4 (four) hours as needed for moderate pain. 10 tablet 0  . ibuprofen (ADVIL) 800 MG tablet Take 1 tablet (800 mg total) by mouth every 8  (eight) hours as needed. 21 tablet 0   No current facility-administered medications on file prior to visit.    History reviewed. No pertinent surgical history.  No Known Allergies  Social History   Socioeconomic History  . Marital status: Single    Spouse name: Not on file  . Number of children: Not on file  . Years of education: Not on file  . Highest education level: Not on file  Occupational History  . Not on file  Tobacco Use  . Smoking status: Never Smoker  . Smokeless tobacco: Never Used  Vaping Use  . Vaping Use: Never used  Substance and Sexual Activity  . Alcohol use: Not Currently  . Drug use: Not on file  . Sexual activity: Not on file  Other Topics Concern  . Not on file  Social History Narrative  . Not on file   Social Determinants of Health   Financial Resource Strain:   . Difficulty of Paying Living Expenses:   Food Insecurity:   . Worried About Programme researcher, broadcasting/film/video in the Last Year:   . Barista in the Last Year:   Transportation Needs:   . Freight forwarder (Medical):   Marland Kitchen Lack of Transportation (Non-Medical):   Physical Activity:   . Days of Exercise per Week:   . Minutes of Exercise per  Session:   Stress:   . Feeling of Stress :   Social Connections:   . Frequency of Communication with Friends and Family:   . Frequency of Social Gatherings with Friends and Family:   . Attends Religious Services:   . Active Member of Clubs or Organizations:   . Attends Banker Meetings:   Marland Kitchen Marital Status:   Intimate Partner Violence:   . Fear of Current or Ex-Partner:   . Emotionally Abused:   Marland Kitchen Physically Abused:   . Sexually Abused:     Family History  Problem Relation Age of Onset  . Cancer Mother     BP 102/74   Ht 6\' 3"  (1.905 m)   Wt 220 lb (99.8 kg)   BMI 27.50 kg/m   Review of Systems: See HPI above.     Objective:  Physical Exam:  Gen: NAD, comfortable in exam room  Left knee/lower leg: No gross  deformity, ecchymoses, swelling.  Lateral patellar shift with flexion to extension. No TTP. FROM without pain ankle or knee.  5/5 strength plantarflexion ankle with no pain on single calf raise. Negative ant/post drawers. Negative valgus/varus testing. Negative lachmans. Negative patellar apprehension.  Negative thompson. NV intact distally.   Assessment & Plan:  1. Left calf strain - significantly improved.  No restrictions on activities.  2. Left knee popping - consistent with patellar shift but without pain.  Start VMO strengthening.  Tylenol or ibuprofen if needed.

## 2019-07-24 NOTE — Patient Instructions (Signed)
You're doing great! No restrictions on activities. For the knee popping it's related to the kneecap tracking to the outside. Start the inside quad (VMO) strengthening exercises and do them for the next 6 weeks. Follow up with me as needed.

## 2020-07-20 ENCOUNTER — Encounter: Payer: Self-pay | Admitting: Emergency Medicine

## 2020-07-20 ENCOUNTER — Ambulatory Visit
Admission: EM | Admit: 2020-07-20 | Discharge: 2020-07-20 | Disposition: A | Discharge status: Home / Self Care | Payer: Self-pay | Attending: Emergency Medicine | Admitting: Emergency Medicine

## 2020-07-20 ENCOUNTER — Other Ambulatory Visit: Payer: Self-pay

## 2020-07-20 DIAGNOSIS — R52 Pain, unspecified: Secondary | ICD-10-CM

## 2020-07-20 DIAGNOSIS — R5383 Other fatigue: Secondary | ICD-10-CM

## 2020-07-20 NOTE — ED Provider Notes (Signed)
[facility]    CSN: [id number] Arrival date & time: 07/20/20  1026      History   Chief Complaint Chief Complaint  Patient presents with   Fatigue   Generalized Body Aches    HPI Nathaniel Hanson is a 40 y.o. male presenting today for evaluation of fatigue and headache.  Symptoms began this morning with waking up.  Has had associated mild nausea, but denies vomiting abdominal pain or diarrhea.  Denies URI symptoms of cough congestion or sore throat.  Reports increased stress at work.  Denies any known COVID exposures.  HPI  History reviewed. No pertinent past medical history.  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 No pertinent surgical history.     Home Medications    Prior to Admission medications   Medication Sig Start Date End Date Taking? Authorizing Provider  ibuprofen (ADVIL) 800 MG tablet Take 1 tablet (800 mg total) by mouth every 8 (eight) hours as needed. 10/03/18   Mickie Bail, NP    Family History Family History  Problem Relation Age of Onset   Cancer Mother     Social History Social History   Tobacco Use   Smoking status: Never   Smokeless tobacco: Never  Vaping Use   Vaping Use: Never used  Substance Use Topics   Alcohol use: Not Currently     Allergies   Patient has no known allergies.   Review of Systems Review of Systems  Constitutional:  Positive for fatigue. Negative for activity change, appetite change, chills and fever.  HENT:  Negative for congestion, ear pain, rhinorrhea, sinus pressure, sore throat and trouble swallowing.   Eyes:  Negative for discharge and redness.  Respiratory:  Negative for cough, chest tightness and shortness of breath.   Cardiovascular:  Negative for chest pain.  Gastrointestinal:  Negative for abdominal pain, diarrhea, nausea and vomiting.  Musculoskeletal:  Negative for myalgias.   Skin:  Negative for rash.  Neurological:  Positive for headaches. Negative for dizziness and light-headedness.    Physical Exam Triage Vital Signs ED Triage Vitals  Enc Vitals Group     BP      Pulse      Resp      Temp      Temp src      SpO2      Weight      Height      Head Circumference      Peak Flow      Pain Score      Pain Loc      Pain Edu?      Excl. in GC?    No data found.  Updated Vital Signs BP 125/77 (BP Location: Left Arm)   Pulse 97   Temp 98.7 F (37.1 C) (Oral)   Resp 18   SpO2 96%   Visual Acuity Right Eye Distance:   Left Eye Distance:   Bilateral Distance:    Right Eye Near:   Left Eye Near:    Bilateral Near:     Physical Exam Vitals and nursing note reviewed.  Constitutional:      Appearance: He is well-developed.     Comments: No acute distress  HENT:     Head: Normocephalic and atraumatic.     Ears:     Comments: Bilateral ears without tenderness to palpation of external auricle,  tragus and mastoid, EAC's without erythema or swelling, TM's with good bony landmarks and cone of light. Non erythematous.      Nose: Nose normal.     Mouth/Throat:     Comments: Oral mucosa pink and moist, no tonsillar enlargement or exudate. Posterior pharynx patent and nonerythematous, no uvula deviation or swelling. Normal phonation.  Eyes:     Conjunctiva/sclera: Conjunctivae normal.  Cardiovascular:     Rate and Rhythm: Normal rate.  Pulmonary:     Effort: Pulmonary effort is normal. No respiratory distress.     Comments: Breathing comfortably at rest, CTABL, no wheezing, rales or other adventitious sounds auscultated  Abdominal:     General: There is no distension.  Musculoskeletal:        General: Normal range of motion.     Cervical back: Neck supple.  Skin:    General: Skin is warm and dry.  Neurological:     Mental Status: He is alert and oriented to person, place, and time.     UC Treatments / Results  Labs (all labs  ordered are listed, but only abnormal results are displayed) Labs Reviewed  NOVEL CORONAVIRUS, NAA    EKG   Radiology No results found.  Procedures Procedures (including critical care time)  Medications Ordered in UC Medications - No data to display  Initial Impression / Assessment and Plan / UC Course  I have reviewed the triage vital signs and the nursing notes.  Pertinent labs & imaging results that were available during my care of the patient were reviewed by me and considered in my medical decision making (see chart for details).     Fatigue body aches and nausea x6 hours-suspect likely viral etiology, COVID test pending for screening, no abdominal pain or tenderness, recommending symptomatic and supportive care and close monitoring of symptoms over the next 24 to 48 hours.  Rest, relax, hydration.  Discussed strict return precautions. Patient verbalized understanding and is agreeable with plan.  Final Clinical Impressions(s) / UC Diagnoses   Final diagnoses:  Generalized body aches  Fatigue, unspecified type     Discharge Instructions      COVID test pending Tylenol and ibuprofen for headache, body aches Rest and fluids Please monitor symptoms over the next 24 to 48 hours Follow-up if not improving or worsening     ED Prescriptions   None    PDMP not reviewed this encounter.   Lew Dawes, PA-C 07/20/20 1102

## 2020-07-20 NOTE — Discharge Instructions (Addendum)
COVID test pending Tylenol and ibuprofen for headache, body aches Rest and fluids Please monitor symptoms over the next 24 to 48 hours Follow-up if not improving or worsening

## 2020-07-20 NOTE — ED Triage Notes (Signed)
Pt here for body aches and fatigue x 6 hours

## 2020-07-22 LAB — SARS-COV-2, NAA 2 DAY TAT

## 2020-07-22 LAB — NOVEL CORONAVIRUS, NAA: SARS-CoV-2, NAA: NOT DETECTED

## 2020-10-11 ENCOUNTER — Other Ambulatory Visit: Payer: Self-pay

## 2020-10-11 ENCOUNTER — Encounter: Payer: Self-pay | Admitting: Emergency Medicine

## 2020-10-11 ENCOUNTER — Ambulatory Visit
Admission: EM | Admit: 2020-10-11 | Discharge: 2020-10-11 | Disposition: A | Discharge status: Home / Self Care | Payer: Self-pay | Attending: Internal Medicine | Admitting: Internal Medicine

## 2020-10-11 DIAGNOSIS — R059 Cough, unspecified: Secondary | ICD-10-CM

## 2020-10-11 MED ORDER — BENZONATATE 100 MG PO CAPS: 100.0000 mg | ORAL_CAPSULE | Freq: Three times a day (TID) | ORAL | 0 refills | Status: AC | PRN

## 2020-10-11 NOTE — ED Provider Notes (Signed)
[facility]    CSN: [id number] Arrival date & time: 10/11/20  1254      History   Chief Complaint Chief Complaint  Patient presents with   Cough    HPI Nathaniel Hanson is a 40 y.o. male.   Patient presents with cough that has been present since last night.  Cough is nonproductive per patient.  Has not yet taken any over-the-counter cough medications to alleviate symptoms.  Denies any upper respiratory symptoms, sore throat, fever, ear pain.  Patient reports that he was recently around somebody who was also coughing.  Denies any chronic health problems or any daily prescription medications.   Cough  History reviewed. No pertinent past medical history.  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 No pertinent surgical history.     Home Medications    Prior to Admission medications   Medication Sig Start Date End Date Taking? Authorizing Provider  benzonatate (TESSALON) 100 MG capsule Take 1 capsule (100 mg total) by mouth every 8 (eight) hours as needed for cough. 10/11/20  Yes Lance Muss, FNP  ibuprofen (ADVIL) 800 MG tablet Take 1 tablet (800 mg total) by mouth every 8 (eight) hours as needed. 10/03/18   Mickie Bail, NP    Family History Family History  Problem Relation Age of Onset   Cancer Mother     Social History Social History   Tobacco Use   Smoking status: Never   Smokeless tobacco: Never  Vaping Use   Vaping Use: Never used  Substance Use Topics   Alcohol use: Not Currently     Allergies   Patient has no known allergies.   Review of Systems Review of Systems Per HPI  Physical Exam Triage Vital Signs ED Triage Vitals  Enc Vitals Group     BP 10/11/20 1305 132/83     Pulse Rate 10/11/20 1305 91     Resp 10/11/20 1305 16     Temp 10/11/20 1305 98.1 F (36.7 C)     Temp Source 10/11/20 1305 Oral     SpO2 10/11/20 1305  95 %     Weight --      Height --      Head Circumference --      Peak Flow --      Pain Score 10/11/20 1306 0     Pain Loc --      Pain Edu? --      Excl. in GC? --    No data found.  Updated Vital Signs BP 132/83 (BP Location: Left Arm)   Pulse 91   Temp 98.1 F (36.7 C) (Oral)   Resp 16   SpO2 95%   Visual Acuity Right Eye Distance:   Left Eye Distance:   Bilateral Distance:    Right Eye Near:   Left Eye Near:    Bilateral Near:     Physical Exam Constitutional:      General: He is not in acute distress.    Appearance: Normal appearance. He is not ill-appearing, toxic-appearing or diaphoretic.  HENT:     Head: Normocephalic and atraumatic.     Right Ear: Tympanic membrane and ear canal normal.     Left Ear: Tympanic membrane and ear canal normal.     Nose: Nose normal.     Mouth/Throat:     Lips: Pink.  Mouth: Mucous membranes are moist.     Pharynx: Oropharynx is clear. No pharyngeal swelling, oropharyngeal exudate, posterior oropharyngeal erythema or uvula swelling.     Tonsils: No tonsillar exudate or tonsillar abscesses.     Comments: Hoarseness heard on exam. Eyes:     Extraocular Movements: Extraocular movements intact.     Conjunctiva/sclera: Conjunctivae normal.  Cardiovascular:     Rate and Rhythm: Normal rate and regular rhythm.     Pulses: Normal pulses.     Heart sounds: Normal heart sounds.  Pulmonary:     Effort: Pulmonary effort is normal. No respiratory distress.     Breath sounds: Normal breath sounds. No stridor. No wheezing, rhonchi or rales.  Skin:    General: Skin is warm and dry.  Neurological:     General: No focal deficit present.     Mental Status: He is alert and oriented to person, place, and time. Mental status is at baseline.  Psychiatric:        Mood and Affect: Mood normal.        Behavior: Behavior normal.        Thought Content: Thought content normal.        Judgment: Judgment normal.     UC Treatments /  Results  Labs (all labs ordered are listed, but only abnormal results are displayed) Labs Reviewed  NOVEL CORONAVIRUS, NAA    EKG   Radiology No results found.  Procedures Procedures (including critical care time)  Medications Ordered in UC Medications - No data to display  Initial Impression / Assessment and Plan / UC Course  I have reviewed the triage vital signs and the nursing notes.  Pertinent labs & imaging results that were available during my care of the patient were reviewed by me and considered in my medical decision making (see chart for details).     It seems that patient may have start of viral respiratory infection with cough.  Suspect symptoms will resolve in the next few days.  Benzonatate prescribed to take as needed for cough.  Discussed other over-the-counter medications to help alleviate symptoms.  COVID-19 PCR pending.Discussed strict return precautions. Patient verbalized understanding and is agreeable with plan.  Final Clinical Impressions(s) / UC Diagnoses   Final diagnoses:  Cough     Discharge Instructions      You likely having a viral upper respiratory infection. We recommended symptom control. I expect your symptoms to start improving in the next 1-2 weeks.   1. Take a daily allergy pill/anti-histamine like Zyrtec, Claritin, or Store brand consistently for 2 weeks  2. For congestion you may try an oral decongestant like Mucinex or sudafed. You may also try intranasal flonase nasal spray or saline irrigations (neti pot, sinus cleanse)  3. For your sore throat you may try cepacol lozenges, salt water gargles, throat spray. Treatment of congestion may also help your sore throat.  4. For cough you may try Robitussen, Mucinex DM  5. Take Tylenol or Ibuprofen to help with pain/inflammation  6. Stay hydrated, drink plenty of fluids to keep throat coated and less irritated  Honey Tea For cough/sore throat try using a honey-based tea. Use 3  teaspoons of honey with juice squeezed from half lemon. Place shaved pieces of ginger into 1/2-1 cup of water and warm over stove top. Then mix the ingredients and repeat every 4 hours as needed.  You have been prescribed cough medication to take as needed.  COVID-19 test is pending.  We  will call if it is positive.     ED Prescriptions     Medication Sig Dispense Auth. Provider   benzonatate (TESSALON) 100 MG capsule Take 1 capsule (100 mg total) by mouth every 8 (eight) hours as needed for cough. 21 capsule Lance Muss, FNP      PDMP not reviewed this encounter.   Lance Muss, FNP 10/11/20 1329

## 2020-10-11 NOTE — ED Triage Notes (Signed)
Cough and hoarseness starting last night and continuing into today. Denies nasal congestion, sore throat.

## 2020-10-11 NOTE — Discharge Instructions (Signed)
You likely having a viral upper respiratory infection. We recommended symptom control. I expect your symptoms to start improving in the next 1-2 weeks.   1. Take a daily allergy pill/anti-histamine like Zyrtec, Claritin, or Store brand consistently for 2 weeks  2. For congestion you may try an oral decongestant like Mucinex or sudafed. You may also try intranasal flonase nasal spray or saline irrigations (neti pot, sinus cleanse)  3. For your sore throat you may try cepacol lozenges, salt water gargles, throat spray. Treatment of congestion may also help your sore throat.  4. For cough you may try Robitussen, Mucinex DM  5. Take Tylenol or Ibuprofen to help with pain/inflammation  6. Stay hydrated, drink plenty of fluids to keep throat coated and less irritated  Honey Tea For cough/sore throat try using a honey-based tea. Use 3 teaspoons of honey with juice squeezed from half lemon. Place shaved pieces of ginger into 1/2-1 cup of water and warm over stove top. Then mix the ingredients and repeat every 4 hours as needed.  You have been prescribed cough medication to take as needed.  COVID-19 test is pending.  We will call if it is positive.

## 2020-10-12 LAB — NOVEL CORONAVIRUS, NAA: SARS-CoV-2, NAA: NOT DETECTED

## 2020-10-12 LAB — SARS-COV-2, NAA 2 DAY TAT

## 2020-10-14 ENCOUNTER — Emergency Department (HOSPITAL_COMMUNITY)
Admission: EM | Admit: 2020-10-14 | Discharge: 2020-10-14 | Disposition: A | Discharge status: Home / Self Care | Payer: Self-pay | Attending: Emergency Medicine | Admitting: Emergency Medicine

## 2020-10-14 ENCOUNTER — Emergency Department (HOSPITAL_COMMUNITY): Payer: Self-pay

## 2020-10-14 ENCOUNTER — Encounter (HOSPITAL_COMMUNITY): Payer: Self-pay

## 2020-10-14 ENCOUNTER — Other Ambulatory Visit: Payer: Self-pay

## 2020-10-14 DIAGNOSIS — J181 Lobar pneumonia, unspecified organism: Secondary | ICD-10-CM | POA: Insufficient documentation

## 2020-10-14 DIAGNOSIS — J189 Pneumonia, unspecified organism: Secondary | ICD-10-CM

## 2020-10-14 MED ORDER — DOXYCYCLINE HYCLATE 100 MG PO CAPS
100.0000 mg | ORAL_CAPSULE | Freq: Two times a day (BID) | ORAL | 0 refills | Status: DC
Start: 2020-10-14 — End: 2022-01-21

## 2020-10-14 MED ORDER — ALBUTEROL SULFATE HFA 108 (90 BASE) MCG/ACT IN AERS
2.0000 | INHALATION_SPRAY | RESPIRATORY_TRACT | Status: DC | PRN
  Filled 2020-10-14: qty 6.7

## 2020-10-14 NOTE — ED Provider Notes (Signed)
[facility] Provider Note   CSN: [id number] Arrival date & time: 10/14/20  2135     History Chief Complaint  Patient presents with   Cough    Nathaniel Hanson is a 40 y.o. male.   Cough Associated symptoms: no chest pain   Patient presents with shortness of breath and cough.  Has had symptoms since Sunday with today being Wednesday.  No real sputum production.  Seen in the ER 2 days ago and had negative COVID test at that time.  States he had been doing well up until this afternoon.  States he felt as if he got anxious after the shortness of breath started.  Feeling somewhat better now.  No allergy history.  No asthma history.  No swelling in his legs.    History reviewed. No pertinent past medical history.  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 No pertinent surgical history.     Family History  Problem Relation Age of Onset   Cancer Mother     Social History   Tobacco Use   Smoking status: Never   Smokeless tobacco: Never  Vaping Use   Vaping Use: Never used  Substance Use Topics   Alcohol use: Not Currently    Home Medications Prior to Admission medications   Medication Sig Start Date End Date Taking? Authorizing Provider  doxycycline (VIBRAMYCIN) 100 MG capsule Take 1 capsule (100 mg total) by mouth 2 (two) times daily. 10/14/20  Yes Benjiman Core, MD  benzonatate (TESSALON) 100 MG capsule Take 1 capsule (100 mg total) by mouth every 8 (eight) hours as needed for cough. 10/11/20   Lance Muss, FNP  ibuprofen (ADVIL) 800 MG tablet Take 1 tablet (800 mg total) by mouth every 8 (eight) hours as needed. 10/03/18   Mickie Bail, NP    Allergies    Patient has no known allergies.  Review of Systems   Review of Systems  Constitutional:  Positive for fatigue. Negative for appetite change.  HENT:  Positive for  congestion.   Respiratory:  Positive for cough.   Cardiovascular:  Negative for chest pain.  Gastrointestinal:  Negative for abdominal distention.  Genitourinary:  Negative for flank pain.  Musculoskeletal:  Negative for back pain.  Skin:  Negative for pallor.  Neurological:  Negative for weakness.  Psychiatric/Behavioral:  The patient is nervous/anxious.    Physical Exam Updated Vital Signs BP (!) 128/95   Pulse 96   Temp 98 F (36.7 C) (Oral)   Resp 20   Ht 6\' 3"  (1.905 m)   Wt 104.3 kg   SpO2 98%   BMI 28.75 kg/m   Physical Exam Vitals and nursing note reviewed.  HENT:     Head: Atraumatic.  Eyes:     Pupils: Pupils are equal, round, and reactive to light.  Cardiovascular:     Rate and Rhythm: Regular rhythm.  Pulmonary:     Breath sounds: No wheezing or rhonchi.     Comments: Mildly harsh breath sound without focal rales or rhonchi. Chest:     Chest wall: No tenderness.  Abdominal:     Tenderness: There is no abdominal tenderness.  Musculoskeletal:        General: No tenderness.     Cervical back: Neck supple.     Right lower leg: No edema.     Left lower leg:  No edema.  Skin:    General: Skin is warm.     Capillary Refill: Capillary refill takes less than 2 seconds.  Neurological:     Mental Status: He is alert and oriented to person, place, and time.    ED Results / Procedures / Treatments   Labs (all labs ordered are listed, but only abnormal results are displayed) Labs Reviewed - No data to display  EKG None  Radiology DG Chest Portable 1 View  Result Date: 10/14/2020 CLINICAL DATA:  Short of EXAM: PORTABLE CHEST 1 VIEW COMPARISON:  None. FINDINGS: Low lung volumes. Possible early patchy infiltrate left base. No pleural effusion. Heart size. No IMPRESSION: Low lung volumes with possible patchy early infiltrate left base Electronically Signed   By: Jasmine Pang M.D.   On: 10/14/2020 22:21    Procedures Procedures   Medications Ordered in  ED Medications  albuterol (VENTOLIN HFA) 108 (90 Base) MCG/ACT inhaler 2 puff (has no administration in time range)    ED Course  I have reviewed the triage vital signs and the nursing notes.  Pertinent labs & imaging results that were available during my care of the patient were reviewed by me and considered in my medical decision making (see chart for details).    MDM Rules/Calculators/A&P                           Patient presents with shortness of breath.  Has had a cough.  Recently seen for same and diagnosed with viral infection.  Negative COVID at that time.  Had a worsening episode today.  Will give albuterol inhaler.  Reportedly had some wheezing but not wheezing at this time.  Chest x-ray done and does show possible left lower lobe infiltrate.  We will treat with antibiotics for an atypical infection.  Well-appearing.  Not hypoxic.  Will discharge home.  Doubt pulmonary embolism.  Doubt pneumothorax. Final Clinical Impression(s) / ED Diagnoses Final diagnoses:  Community acquired pneumonia of left lower lobe of lung    Rx / DC Orders ED Discharge Orders          Ordered    doxycycline (VIBRAMYCIN) 100 MG capsule  2 times daily        10/14/20 2235             Benjiman Core, MD 10/14/20 2242

## 2020-10-14 NOTE — ED Triage Notes (Signed)
Pt reports being diagnosed with a viral upper respiratory a few days ago. He now endorses worsening cough, wheezing, and mild SHOB.

## 2021-02-03 ENCOUNTER — Other Ambulatory Visit: Payer: Self-pay

## 2021-02-03 ENCOUNTER — Emergency Department (HOSPITAL_COMMUNITY): Payer: Self-pay

## 2021-02-03 ENCOUNTER — Emergency Department (HOSPITAL_COMMUNITY)
Admission: EM | Admit: 2021-02-03 | Discharge: 2021-02-03 | Disposition: A | Discharge status: Home / Self Care | Payer: Self-pay | Attending: Emergency Medicine | Admitting: Emergency Medicine

## 2021-02-03 ENCOUNTER — Encounter (HOSPITAL_COMMUNITY): Payer: Self-pay | Admitting: Oncology

## 2021-02-03 ENCOUNTER — Emergency Department (HOSPITAL_BASED_OUTPATIENT_CLINIC_OR_DEPARTMENT_OTHER): Payer: Self-pay

## 2021-02-03 DIAGNOSIS — M79605 Pain in left leg: Secondary | ICD-10-CM | POA: Insufficient documentation

## 2021-02-03 DIAGNOSIS — R071 Chest pain on breathing: Secondary | ICD-10-CM | POA: Insufficient documentation

## 2021-02-03 DIAGNOSIS — R079 Chest pain, unspecified: Secondary | ICD-10-CM

## 2021-02-03 LAB — CBC
HCT: 46.8 % (ref 39.0–52.0)
Hemoglobin: 15.5 g/dL (ref 13.0–17.0)
MCH: 27.9 pg (ref 26.0–34.0)
MCHC: 33.1 g/dL (ref 30.0–36.0)
MCV: 84.2 fL (ref 80.0–100.0)
Platelets: 250 10*3/uL (ref 150–400)
RBC: 5.56 MIL/uL (ref 4.22–5.81)
RDW: 14 % (ref 11.5–15.5)
WBC: 6.2 10*3/uL (ref 4.0–10.5)
nRBC: 0 % (ref 0.0–0.2)

## 2021-02-03 LAB — BASIC METABOLIC PANEL
Anion gap: 5 (ref 5–15)
BUN: 13 mg/dL (ref 6–20)
CO2: 26 mmol/L (ref 22–32)
Calcium: 9.1 mg/dL (ref 8.9–10.3)
Chloride: 104 mmol/L (ref 98–111)
Creatinine, Ser: 1.02 mg/dL (ref 0.61–1.24)
GFR, Estimated: >60 mL/min (ref >60)
Glucose, Bld: 98 mg/dL (ref 70–99)
Potassium: 4.1 mmol/L (ref 3.5–5.1)
Sodium: 135 mmol/L (ref 135–145)

## 2021-02-03 LAB — TROPONIN I (HIGH SENSITIVITY): Troponin I (High Sensitivity): 3 ng/L (ref <18)

## 2021-02-03 NOTE — ED Triage Notes (Signed)
Pt c/o left leg pain that began last night.  Pt has concerns for blood clot d/t family hx.  Pt endorses pain in chest w/ a deep breath.

## 2021-02-03 NOTE — Discharge Instructions (Signed)
There was no blood clot seen on your ultrasound.  Your heart tests and x-rays were otherwise normal as well.  Call your primary care doctor or specialist as discussed in the next 2-3 days.   Return immediately back to the ER if:  Your symptoms worsen within the next 12-24 hours. You develop new symptoms such as new fevers, persistent vomiting, new pain, shortness of breath, or new weakness or numbness, or if you have any other concerns.

## 2021-02-03 NOTE — ED Provider Notes (Signed)
[facility]-EMERGENCY DEPT Provider Note   CSN: [id number] Arrival date & time: 02/03/21  1111     History  Chief Complaint  Patient presents with   Leg Pain    Nathaniel Hanson is a 41 y.o. male.  Patient presents with complaint of left leg swelling and tenderness pain in the calf.  Symptoms ongoing since last night.  Some chest tightness every time he takes a deep breath.  However denies cough denies fevers denies vomiting or diarrhea.  He states his mother had blood clots and is concerned that he may have developed 1 as well.      Home Medications Prior to Admission medications   Medication Sig Start Date End Date Taking? Authorizing Provider  benzonatate (TESSALON) 100 MG capsule Take 1 capsule (100 mg total) by mouth every 8 (eight) hours as needed for cough. 10/11/20   Gustavus Bryant, FNP  doxycycline (VIBRAMYCIN) 100 MG capsule Take 1 capsule (100 mg total) by mouth 2 (two) times daily. 10/14/20   Benjiman Core, MD  ibuprofen (ADVIL) 800 MG tablet Take 1 tablet (800 mg total) by mouth every 8 (eight) hours as needed. 10/03/18   Mickie Bail, NP      Allergies    Patient has no known allergies.    Review of Systems   Review of Systems  Constitutional:  Negative for fever.  HENT:  Negative for ear pain and sore throat.   Eyes:  Negative for pain.  Respiratory:  Positive for chest tightness. Negative for cough.   Cardiovascular:  Positive for leg swelling. Negative for palpitations.  Gastrointestinal:  Negative for abdominal pain.  Genitourinary:  Negative for flank pain.  Musculoskeletal:  Negative for back pain.  Skin:  Negative for color change and rash.  Neurological:  Negative for syncope.  All other systems reviewed and are negative.  Physical Exam Updated Vital Signs BP (!) 142/106 (BP Location: Right Arm)    Pulse 83    Temp 98.3 F (36.8 C) (Oral)    Resp 20    Ht 6\' 3"  (1.905 m)    Wt 114.3 kg    SpO2 97%    BMI 31.50 kg/m   Physical Exam Constitutional:      Appearance: He is well-developed.  HENT:     Head: Normocephalic.     Nose: Nose normal.  Eyes:     Extraocular Movements: Extraocular movements intact.  Cardiovascular:     Rate and Rhythm: Normal rate.  Pulmonary:     Effort: Pulmonary effort is normal.  Abdominal:     Tenderness: There is no abdominal tenderness. There is no guarding or rebound.  Musculoskeletal:     Comments: Mild increased welling of the left calf compared to the right.  No pitting edema bilaterally.  No tenderness abnormal warmth or cellulitis noted in either extremity.  Compartments otherwise soft neurovascularly intact.  Skin:    Coloration: Skin is not jaundiced.  Neurological:     Mental Status: He is alert. Mental status is at baseline.    ED Results / Procedures / Treatments   Labs (all labs ordered are listed, but only abnormal results are displayed) Labs Reviewed  CBC  BASIC METABOLIC PANEL  TROPONIN I (HIGH SENSITIVITY)    EKG EKG Interpretation  Date/Time:  Wednesday February 03 2021 13:37:49 EST Ventricular Rate:  77 PR Interval:  141 QRS Duration: 97 QT Interval:  368 QTC Calculation: 417 R Axis:   10 Text Interpretation:  Sinus rhythm Abnormal R-wave progression, early transition Confirmed by Norman Clay ([id number]) on 02/03/2021 1:44:15 PM  Radiology DG Chest 2 View  Result Date: 02/03/2021 CLINICAL DATA:  Shortness of breath EXAM: CHEST - 2 VIEW COMPARISON:  Chest x-ray 10/14/2020 FINDINGS: Cardiomediastinal silhouette is stable and within normal limits. Low lung volumes with crowding of the vasculature. No focal consolidation identified. No pleural effusion or pneumothorax. IMPRESSION: Low lung volumes.  No focal consolidation identified. Electronically Signed   By: Jannifer Hick M.D.   On: 02/03/2021 13:01   VAS [place] LOWER EXTREMITY VENOUS (DVT) (7a-7p)  Result Date: 02/03/2021  Lower Venous DVT Study Patient Name:  Nathaniel Hanson  Date of Exam:    02/03/2021 Medical Rec #: [id number]       Accession #:    [id number] Date of Birth: 10-23-1980       Patient Gender: M Patient Age:   38 years Exam Location:  [facility] Procedure:      VAS [place] LOWER EXTREMITY VENOUS (DVT) Referring Phys: Maralyn Sago SMOOT --------------------------------------------------------------------------------  Indications: Pain.  Comparison Study: previous exam on 05/29/2019 was negative for DVT. Performing Technologist: Ernestene Mention RVT, RDMS  Examination Guidelines: A complete evaluation includes B-mode imaging, spectral Doppler, color Doppler, and power Doppler as needed of all accessible portions of each vessel. Bilateral testing is considered an integral part of a complete examination. Limited examinations for reoccurring indications may be performed as noted. The reflux portion of the exam is performed with the patient in reverse Trendelenburg.  +-----+---------------+---------+-----------+----------+--------------+  RIGHT Compressibility Phasicity Spontaneity Properties Thrombus Aging  +-----+---------------+---------+-----------+----------+--------------+  CFV   Full            Yes       Yes                                    +-----+---------------+---------+-----------+----------+--------------+   +---------+---------------+---------+-----------+----------+--------------+  LEFT      Compressibility Phasicity Spontaneity Properties Thrombus Aging  +---------+---------------+---------+-----------+----------+--------------+  CFV       Full            Yes       Yes                                    +---------+---------------+---------+-----------+----------+--------------+  SFJ       Full                                                             +---------+---------------+---------+-----------+----------+--------------+  FV Prox   Full            Yes       Yes                                    +---------+---------------+---------+-----------+----------+--------------+  FV Mid     Full            Yes       Yes                                    +---------+---------------+---------+-----------+----------+--------------+  FV Distal Full            Yes       Yes                                    +---------+---------------+---------+-----------+----------+--------------+  PFV       Full                                                             +---------+---------------+---------+-----------+----------+--------------+  POP       Full            Yes       Yes                                    +---------+---------------+---------+-----------+----------+--------------+  PTV       Full                                                             +---------+---------------+---------+-----------+----------+--------------+  PERO      Full                                                             +---------+---------------+---------+-----------+----------+--------------+     Summary: RIGHT: - No evidence of common femoral vein obstruction.  LEFT: - There is no evidence of deep vein thrombosis in the lower extremity. - There is no evidence of superficial venous thrombosis.  - No cystic structure found in the popliteal fossa.  *See table(s) above for measurements and observations.    Preliminary     Procedures Procedures    Medications Ordered in ED Medications - No data to display  ED Course/ Medical Decision Making/ A&P                           Medical Decision Making  No evidence of DVT on ultrasound.  Patient is otherwise neurovascularly intact.  He is otherwise PERC negative as well.  Troponin is negative EKG unremarkable.  Will recommend outpatient follow-up with his doctor within 3 to 4 days.  Advising immediate return for worsening symptoms difficulty breathing or any additional concerns.        Final Clinical Impression(s) / ED Diagnoses Final diagnoses:  Left leg pain  Chest pain, unspecified type    Rx / DC Orders ED Discharge Orders     None          Cheryll CockayneHong, Sanjuanita Condrey S, MD 02/03/21 1345

## 2021-02-03 NOTE — ED Notes (Signed)
Ultrasound at bedside

## 2021-02-03 NOTE — Progress Notes (Signed)
LLE venous duplex has been completed.  Preliminary results given to Lavonna Rua, PA.   Results can be found under chart review under CV PROC. 02/03/2021 1:13 PM Zyden Suman RVT, RDMS

## 2021-02-03 NOTE — ED Provider Triage Note (Signed)
Emergency Medicine Provider Triage Evaluation Note  Nathaniel Hanson , a 41 y.o. male  was evaluated in triage.  Pt complains of left leg pain.  He states that same began approximately 48 hours ago and came on all of a sudden.  He denies ever feeling pain like this in the past.  States that he has a family history of blood clots which prompted his concern and visit to the ER today.  Pain is located in his left calf and popliteal fossa.  He denies any personal history of blood clots.  He also endorses some chest pain and shortness of breath, however states he had pneumonia in October and states that does not feel he is fully recovered from this.  But does feel like his shortness of breath is gotten worse in the past few days.  Review of Systems  Positive:  Negative: See above  Physical Exam  BP (!) 142/106 (BP Location: Right Arm)    Pulse 83    Temp 98.3 F (36.8 C) (Oral)    Resp 20    Ht 6\' 3"  (1.905 m)    Wt 114.3 kg    SpO2 97%    BMI 31.50 kg/m  Gen:   Awake, no distress   Resp:  Normal effort  MSK:   Moves extremities without difficulty  Other:  Left DP and PT pulses intact and 2+  Medical Decision Making  Medically screening exam initiated at 12:24 PM.  Appropriate orders placed.  Mckinley R Backhaus was informed that the remainder of the evaluation will be completed by another provider, this initial triage assessment does not replace that evaluation, and the importance of remaining in the ED until their evaluation is complete.     , PA-C 02/03/21 1227

## 2021-02-03 NOTE — ED Notes (Signed)
Dc instructions reviewed with pt no questions or concerns at this time. Will follow up with pcp. Ambulated to lobby without difficulty

## 2021-03-09 ENCOUNTER — Emergency Department (HOSPITAL_BASED_OUTPATIENT_CLINIC_OR_DEPARTMENT_OTHER): Payer: Self-pay | Admitting: Radiology

## 2021-03-09 ENCOUNTER — Encounter (HOSPITAL_BASED_OUTPATIENT_CLINIC_OR_DEPARTMENT_OTHER): Payer: Self-pay

## 2021-03-09 ENCOUNTER — Other Ambulatory Visit: Payer: Self-pay

## 2021-03-09 ENCOUNTER — Emergency Department (HOSPITAL_BASED_OUTPATIENT_CLINIC_OR_DEPARTMENT_OTHER)
Admission: EM | Admit: 2021-03-09 | Discharge: 2021-03-09 | Disposition: A | Discharge status: Home / Self Care | Payer: Self-pay | Attending: Emergency Medicine | Admitting: Emergency Medicine

## 2021-03-09 DIAGNOSIS — H538 Other visual disturbances: Secondary | ICD-10-CM | POA: Insufficient documentation

## 2021-03-09 DIAGNOSIS — R42 Dizziness and giddiness: Secondary | ICD-10-CM | POA: Insufficient documentation

## 2021-03-09 DIAGNOSIS — R079 Chest pain, unspecified: Secondary | ICD-10-CM | POA: Insufficient documentation

## 2021-03-09 LAB — BASIC METABOLIC PANEL
Anion gap: 9 (ref 5–15)
BUN: 9 mg/dL (ref 6–20)
CO2: 25 mmol/L (ref 22–32)
Calcium: 9.7 mg/dL (ref 8.9–10.3)
Chloride: 103 mmol/L (ref 98–111)
Creatinine, Ser: 0.96 mg/dL (ref 0.61–1.24)
GFR, Estimated: >60 mL/min (ref >60)
Glucose, Bld: 99 mg/dL (ref 70–99)
Potassium: 4.2 mmol/L (ref 3.5–5.1)
Sodium: 137 mmol/L (ref 135–145)

## 2021-03-09 LAB — CBC
HCT: 47.7 % (ref 39.0–52.0)
Hemoglobin: 15.7 g/dL (ref 13.0–17.0)
MCH: 27.5 pg (ref 26.0–34.0)
MCHC: 32.9 g/dL (ref 30.0–36.0)
MCV: 83.5 fL (ref 80.0–100.0)
Platelets: 270 10*3/uL (ref 150–400)
RBC: 5.71 MIL/uL (ref 4.22–5.81)
RDW: 14.1 % (ref 11.5–15.5)
WBC: 6.5 10*3/uL (ref 4.0–10.5)
nRBC: 0 % (ref 0.0–0.2)

## 2021-03-09 LAB — TROPONIN I (HIGH SENSITIVITY)
Troponin I (High Sensitivity): 2 ng/L (ref <18)
Troponin I (High Sensitivity): 3 ng/L (ref <18)

## 2021-03-09 NOTE — ED Triage Notes (Signed)
Patient here POV from Home with CP.  CP is associated with Fatigue, SOB, Dizziness, Mild Nausea.  States he was seen in January for similar symptoms with unremarkable results. Symptoms have been intermittent in nature since but have worsening over the past 3 days.  No Fevers. No Vomiting. No Cough.  NAD noted during Triage. A&Ox4. GCS 15. Ambulatory.

## 2021-03-09 NOTE — Discharge Instructions (Addendum)
Your work-up today was reassuring.  There is no sign of acute heart attack.  Chest x-ray did not show pneumonia.  EKG was reassuring.  If you have worsening symptoms please return to the emergency room.  Otherwise please call and schedule a follow-up appointment with your PCP for further work-up and management of your chest pain.

## 2021-03-09 NOTE — ED Provider Notes (Signed)
[place] EMERGENCY DEPT Provider Note   CSN: [id number] Arrival date & time: 03/09/21  1324     History  Chief Complaint  Patient presents with   Chest Pain    Nathaniel Hanson is a 41 y.o. male.  41 year old male presents today for evaluation of intermittent chest pain.  Patient states he had similar symptoms last month and was evaluated at [facility] long emergency room.  He states since then his symptoms have been intermittent and associated with dizziness and blurry vision.  Denies dizziness or blurry vision outside of episode of chest pain.  Denies shortness of breath, or radiation of this chest pain.  Without nausea, vomiting, abdominal pain, hemoptysis.  Without lower extremity edema.  Denies fever, chills, productive cough.  No prior cardiac history.  The history is provided by the patient. No language interpreter was used.      Home Medications Prior to Admission medications   Medication Sig Start Date End Date Taking? Authorizing Provider  benzonatate (TESSALON) 100 MG capsule Take 1 capsule (100 mg total) by mouth every 8 (eight) hours as needed for cough. Patient not taking: Reported on 03/09/2021 10/11/20   Teodora Medici, FNP  doxycycline (VIBRAMYCIN) 100 MG capsule Take 1 capsule (100 mg total) by mouth 2 (two) times daily. Patient not taking: Reported on 03/09/2021 10/14/20   Davonna Belling, MD  ibuprofen (ADVIL) 800 MG tablet Take 1 tablet (800 mg total) by mouth every 8 (eight) hours as needed. Patient not taking: Reported on 03/09/2021 10/03/18   Sharion Balloon, NP      Allergies    Patient has no known allergies.    Review of Systems   Review of Systems  Constitutional:  Negative for chills and fever.  HENT:  Negative for congestion.   Eyes:  Positive for visual disturbance.  Respiratory:  Negative for cough and shortness of breath.   Cardiovascular:  Positive for chest pain.  Gastrointestinal:  Negative for abdominal pain, nausea and vomiting.   Neurological:  Positive for dizziness. Negative for weakness and light-headedness.  All other systems reviewed and are negative.  Physical Exam Updated Vital Signs BP 123/84    Pulse 71    Temp 98.2 F (36.8 C)    Resp 17    Ht 6\' 3"  (1.905 m)    Wt 114.3 kg    SpO2 99%    BMI 31.50 kg/m  Physical Exam Vitals and nursing note reviewed.  Constitutional:      General: He is not in acute distress.    Appearance: Normal appearance. He is not ill-appearing.  HENT:     Head: Normocephalic and atraumatic.     Nose: Nose normal.  Eyes:     General: No scleral icterus.    Extraocular Movements: Extraocular movements intact.     Conjunctiva/sclera: Conjunctivae normal.  Cardiovascular:     Rate and Rhythm: Normal rate and regular rhythm.     Pulses: Normal pulses.     Heart sounds: Normal heart sounds.  Pulmonary:     Effort: Pulmonary effort is normal. No respiratory distress.     Breath sounds: Normal breath sounds. No wheezing or rales.  Abdominal:     General: There is no distension.     Palpations: Abdomen is soft.     Tenderness: There is no abdominal tenderness. There is no guarding.  Musculoskeletal:        General: Normal range of motion.     Cervical back: Normal range  of motion.     Right lower leg: No edema.     Left lower leg: No edema.  Skin:    General: Skin is warm and dry.  Neurological:     General: No focal deficit present.     Mental Status: He is alert. Mental status is at baseline.    ED Results / Procedures / Treatments   Labs (all labs ordered are listed, but only abnormal results are displayed) Labs Reviewed  BASIC METABOLIC PANEL  CBC  TROPONIN I (HIGH SENSITIVITY)  TROPONIN I (HIGH SENSITIVITY)    EKG EKG Interpretation  Date/Time:  Tuesday March 09 2021 13:30:32 EST Ventricular Rate:  89 PR Interval:  142 QRS Duration: 90 QT Interval:  338 QTC Calculation: 411 R Axis:   -51 Text Interpretation: Normal sinus rhythm Left anterior  fascicular block Minimal voltage criteria for LVH, may be normal variant ( R in aVL ) Possible Lateral infarct , age undetermined Abnormal ECG When compared with ECG of 03-Feb-2021 13:37, PREVIOUS ECG IS PRESENT Confirmed by Regan Lemming ([id number]) on 03/09/2021 2:22:44 PM  Radiology DG Chest 2 View  Result Date: 03/09/2021 CLINICAL DATA:  Left chest pain 1 month EXAM: CHEST - 2 VIEW COMPARISON:  02/03/2021 FINDINGS: Mild left lower lobe airspace disease unchanged, favor atelectasis. Right lung clear Negative for heart failure or edema.  No pleural effusion. IMPRESSION: Mild left lower lobe atelectasis unchanged. Electronically Signed   By: Franchot Gallo M.D.   On: 03/09/2021 13:52    Procedures Procedures    Medications Ordered in ED Medications - No data to display  ED Course/ Medical Decision Making/ A&P                           Medical Decision Making Amount and/or Complexity of Data Reviewed Labs: ordered. Radiology: ordered.   41 year old male presents today for evaluation of chest pain of 1 month duration.  Pain is intermittent and without associated symptoms.  Low risk for PE.  PERC negative.  Work-up with negative troponin.  CBC BMP unremarkable.  EKG without acute ischemic changes.  Chest x-ray without acute cardiopulm process.  Currently chest pain-free.  Chest x-ray does show atelectasis however without clinical signs of pneumonia.  Patient did not follow-up with PCP after last emergency room visit.  Did discuss other cardiology referral or further work-up with PCP.  He states he is due for a physical and would prefer to follow-up with PCP and get referral from cardiology from there if needed.  Low heart score.  Patient is appropriate for discharge.  Discharged in stable condition.  Return precautions discussed with patient at length.  Final Clinical Impression(s) / ED Diagnoses Final diagnoses:  Chest pain, unspecified type    Rx / DC Orders ED Discharge Orders     None          Evlyn Courier, PA-C 03/09/21 1546    Regan Lemming, MD 03/09/21 2129

## 2021-05-30 IMAGING — DX DG HAND COMPLETE 3+V*L*
3 series · 3 of 3 positions shown · non-contrast
Comparison: None.

CLINICAL DATA: Pain following fall

EXAM:
LEFT HAND - COMPLETE 3+ VIEW

[hand pa]
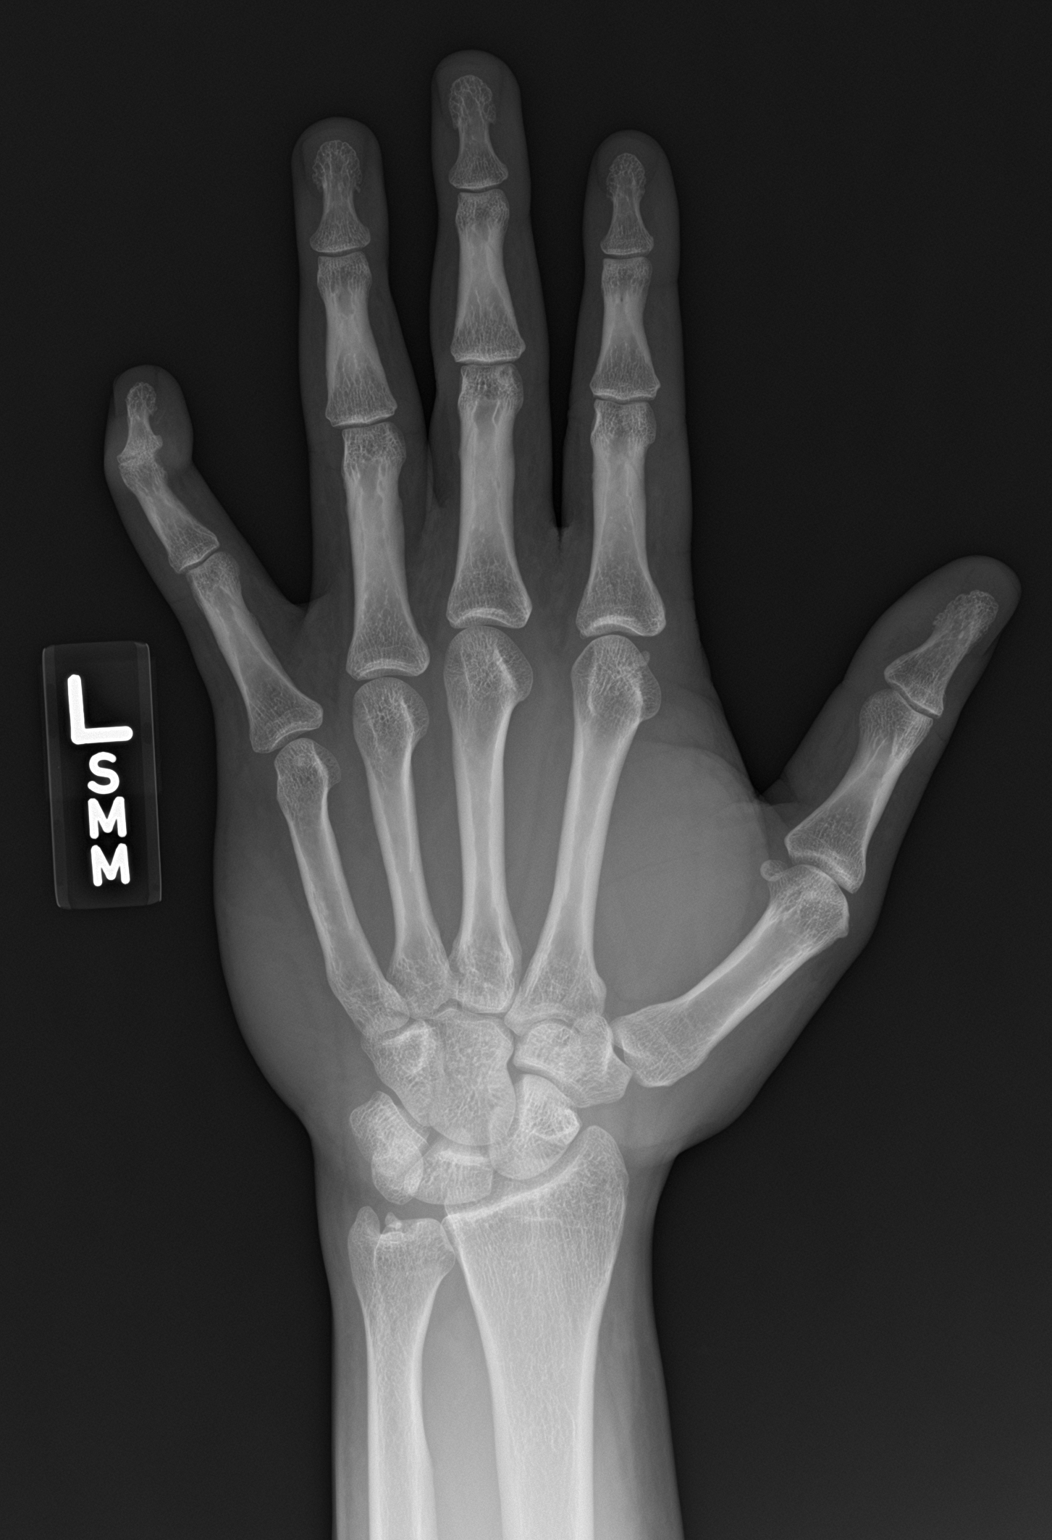

[hand obl]
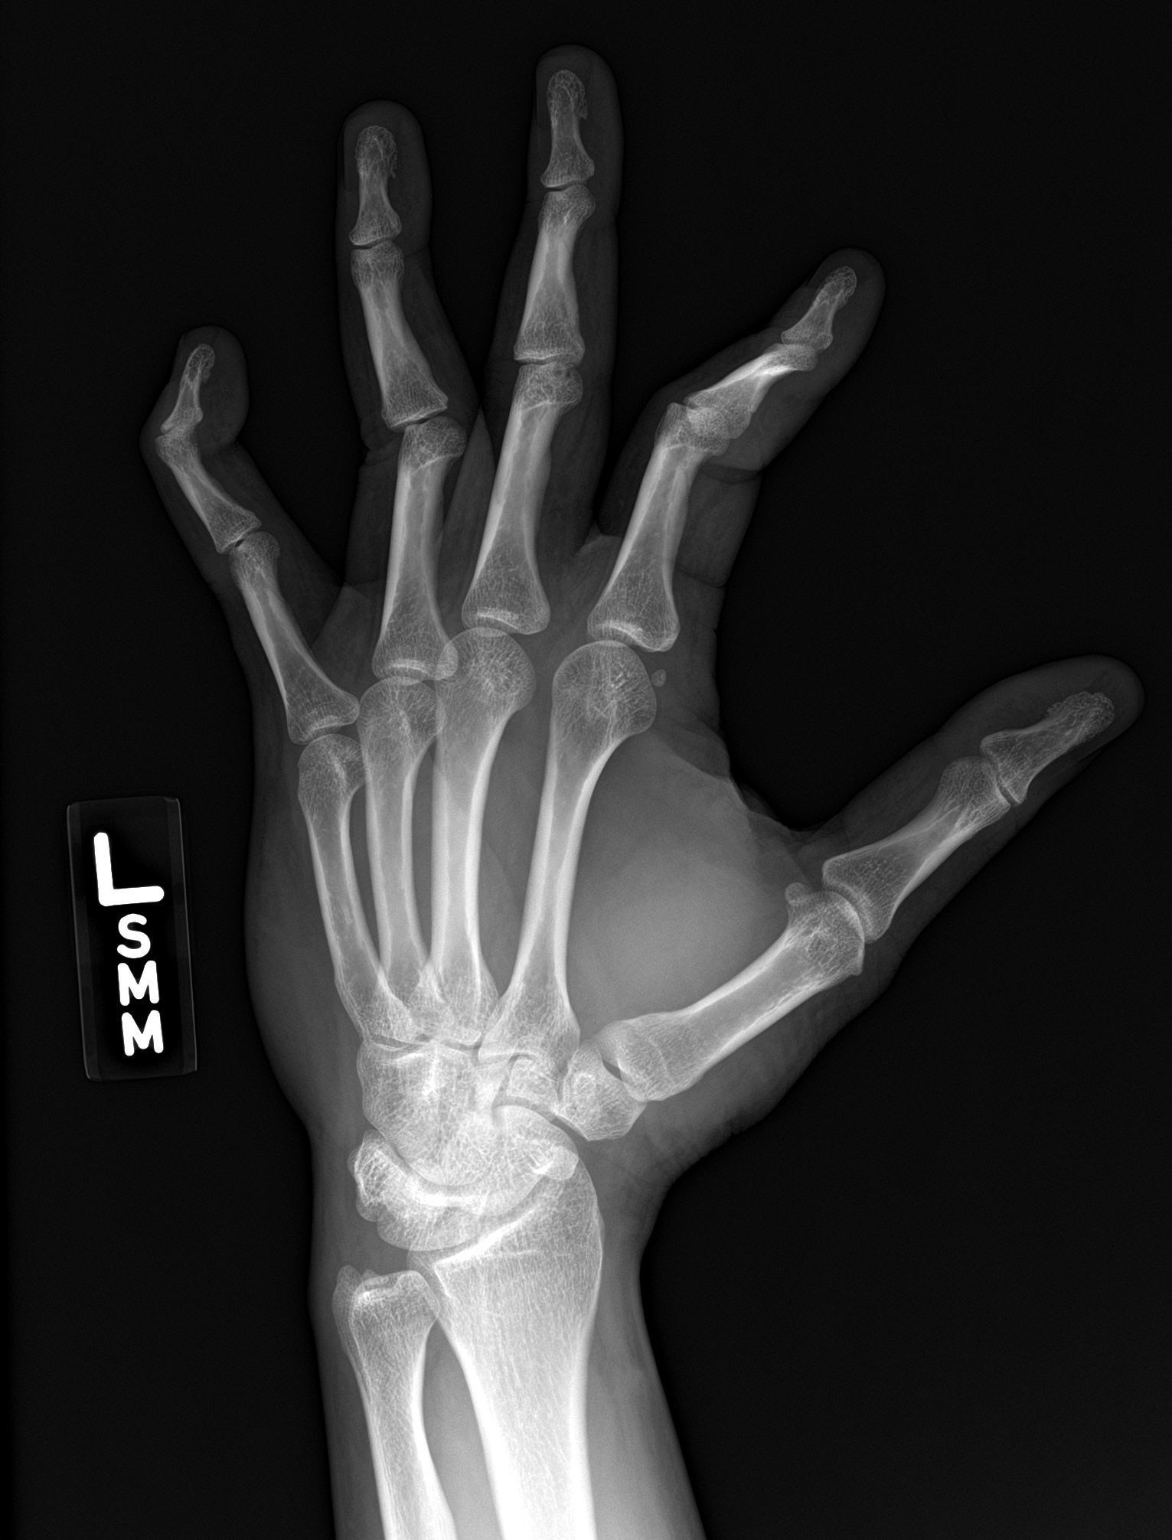

[hand lat]
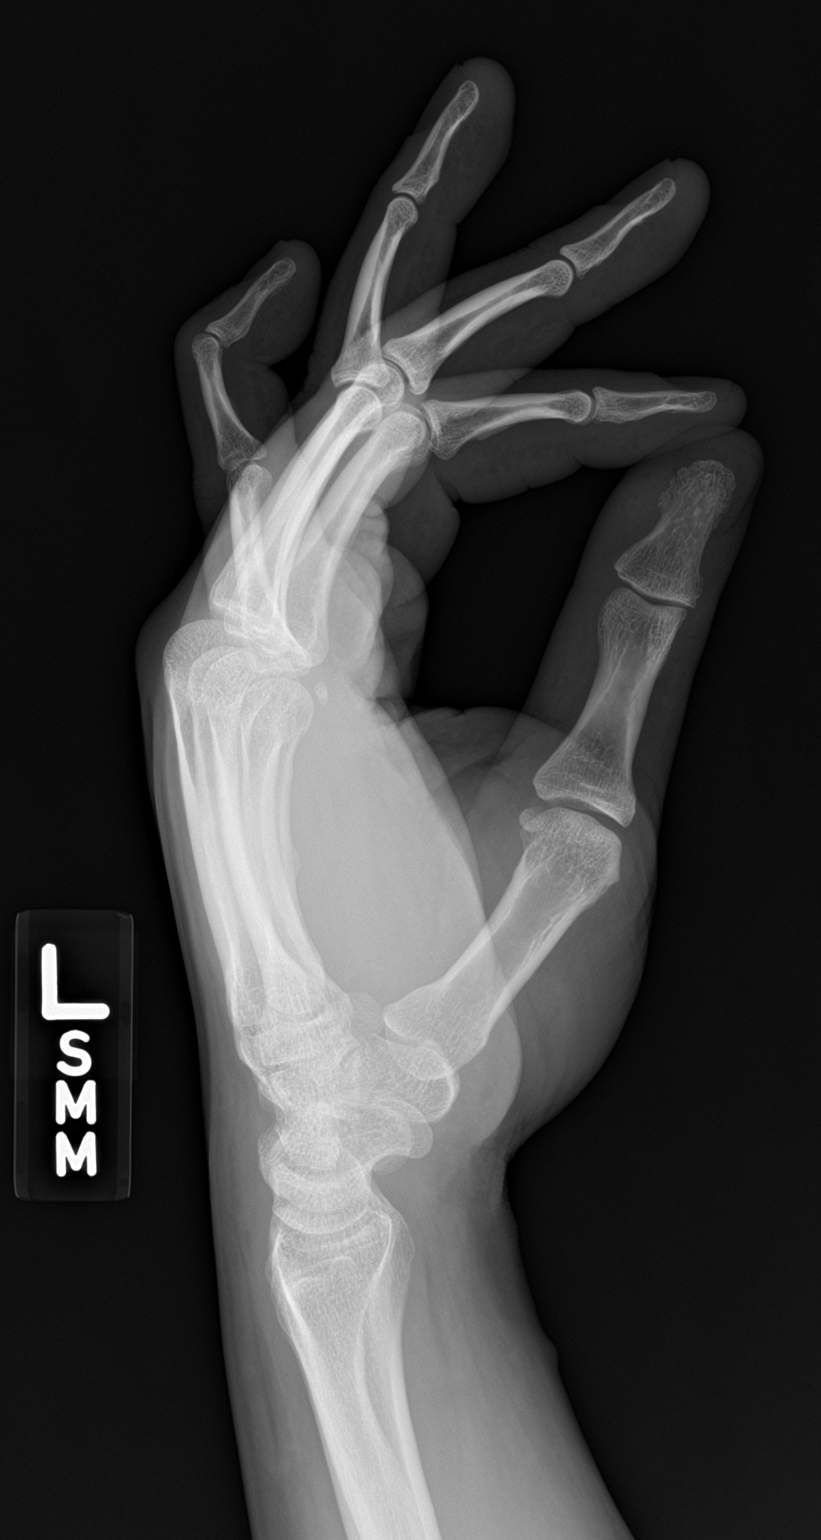

[3 of 3 positions shown; findings below may reference images not displayed]

FINDINGS: Frontal, oblique, and lateral views obtained. There is persistent
flexion of the fifth DIP joint on all images. No frank dislocation.
No acute fracture is evident. There is a small focus of
calcification lateral to the ulnar styloid which may represent
residua of old trauma. There is soft tissue swelling medially. There
is narrowing of the fifth DIP joint. Other joint spaces appear
normal. No erosive changes.
IMPRESSION: Soft tissue swelling. No acute fracture or dislocation. Suspect
residua of old trauma distal to the ulna, medial to the ulnar
styloid.

Persistent flexion of the fifth DIP joint which may represent a
fixed defect. There is narrowing of the fifth DIP joint. No frank
dislocation in this area. Other joint spaces appear normal.

## 2021-12-20 DIAGNOSIS — Z809 Family history of malignant neoplasm, unspecified: Secondary | ICD-10-CM

## 2021-12-20 DIAGNOSIS — Z597 Insufficient social insurance and welfare support: Secondary | ICD-10-CM

## 2021-12-20 DIAGNOSIS — E162 Hypoglycemia, unspecified: Secondary | ICD-10-CM | POA: Diagnosis present

## 2021-12-20 DIAGNOSIS — E871 Hypo-osmolality and hyponatremia: Secondary | ICD-10-CM | POA: Diagnosis present

## 2021-12-20 DIAGNOSIS — K562 Volvulus: Principal | ICD-10-CM | POA: Diagnosis present

## 2021-12-20 DIAGNOSIS — R000 Tachycardia, unspecified: Secondary | ICD-10-CM | POA: Diagnosis not present

## 2021-12-20 DIAGNOSIS — K9189 Other postprocedural complications and disorders of digestive system: Secondary | ICD-10-CM | POA: Diagnosis not present

## 2021-12-20 DIAGNOSIS — K219 Gastro-esophageal reflux disease without esophagitis: Secondary | ICD-10-CM | POA: Diagnosis present

## 2021-12-20 DIAGNOSIS — S30821A Blister (nonthermal) of abdominal wall, initial encounter: Secondary | ICD-10-CM | POA: Diagnosis not present

## 2021-12-20 DIAGNOSIS — Y838 Other surgical procedures as the cause of abnormal reaction of the patient, or of later complication, without mention of misadventure at the time of the procedure: Secondary | ICD-10-CM | POA: Diagnosis present

## 2021-12-20 DIAGNOSIS — K660 Peritoneal adhesions (postprocedural) (postinfection): Secondary | ICD-10-CM | POA: Diagnosis present

## 2021-12-20 DIAGNOSIS — Z1152 Encounter for screening for COVID-19: Secondary | ICD-10-CM

## 2021-12-20 DIAGNOSIS — K560 Paralytic ileus: Secondary | ICD-10-CM | POA: Diagnosis not present

## 2021-12-20 DIAGNOSIS — E669 Obesity, unspecified: Secondary | ICD-10-CM | POA: Diagnosis present

## 2021-12-20 DIAGNOSIS — E7439 Other disorders of intestinal carbohydrate absorption: Secondary | ICD-10-CM | POA: Diagnosis present

## 2021-12-20 DIAGNOSIS — M545 Low back pain, unspecified: Secondary | ICD-10-CM | POA: Diagnosis not present

## 2021-12-20 DIAGNOSIS — R7989 Other specified abnormal findings of blood chemistry: Secondary | ICD-10-CM | POA: Diagnosis not present

## 2021-12-20 DIAGNOSIS — R55 Syncope and collapse: Secondary | ICD-10-CM | POA: Diagnosis present

## 2021-12-20 DIAGNOSIS — R053 Chronic cough: Secondary | ICD-10-CM | POA: Diagnosis present

## 2021-12-20 DIAGNOSIS — T8132XA Disruption of internal operation (surgical) wound, not elsewhere classified, initial encounter: Secondary | ICD-10-CM | POA: Diagnosis not present

## 2021-12-20 DIAGNOSIS — K5939 Other megacolon: Secondary | ICD-10-CM | POA: Diagnosis present

## 2021-12-20 DIAGNOSIS — E860 Dehydration: Secondary | ICD-10-CM | POA: Diagnosis present

## 2021-12-20 DIAGNOSIS — Z6834 Body mass index (BMI) 34.0-34.9, adult: Secondary | ICD-10-CM

## 2021-12-21 ENCOUNTER — Emergency Department (HOSPITAL_COMMUNITY): Payer: Self-pay

## 2021-12-21 ENCOUNTER — Other Ambulatory Visit: Payer: Self-pay

## 2021-12-21 ENCOUNTER — Inpatient Hospital Stay (HOSPITAL_COMMUNITY)
Admission: EM | Admit: 2021-12-21 | Discharge: 2022-01-21 | DRG: 330 | Disposition: A | Discharge status: Home / Self Care | Payer: Self-pay | Attending: Family Medicine | Admitting: Family Medicine

## 2021-12-21 ENCOUNTER — Encounter (HOSPITAL_COMMUNITY): Payer: Self-pay

## 2021-12-21 DIAGNOSIS — K562 Volvulus: Secondary | ICD-10-CM

## 2021-12-21 DIAGNOSIS — K56609 Unspecified intestinal obstruction, unspecified as to partial versus complete obstruction: Secondary | ICD-10-CM

## 2021-12-21 DIAGNOSIS — R55 Syncope and collapse: Principal | ICD-10-CM | POA: Diagnosis present

## 2021-12-21 LAB — CBC
HCT: 43.9 % (ref 39.0–52.0)
Hemoglobin: 14.7 g/dL (ref 13.0–17.0)
MCH: 28.1 pg (ref 26.0–34.0)
MCHC: 33.5 g/dL (ref 30.0–36.0)
MCV: 83.8 fL (ref 80.0–100.0)
Platelets: 252 10*3/uL (ref 150–400)
RBC: 5.24 MIL/uL (ref 4.22–5.81)
RDW: 13.5 % (ref 11.5–15.5)
WBC: 9.2 10*3/uL (ref 4.0–10.5)
nRBC: 0 % (ref 0.0–0.2)

## 2021-12-21 LAB — BASIC METABOLIC PANEL
Anion gap: 7 (ref 5–15)
BUN: 15 mg/dL (ref 6–20)
CO2: 22 mmol/L (ref 22–32)
Calcium: 9 mg/dL (ref 8.9–10.3)
Chloride: 109 mmol/L (ref 98–111)
Creatinine, Ser: 0.97 mg/dL (ref 0.61–1.24)
GFR, Estimated: >60 mL/min (ref >60)
Glucose, Bld: 96 mg/dL (ref 70–99)
Potassium: 3.7 mmol/L (ref 3.5–5.1)
Sodium: 138 mmol/L (ref 135–145)

## 2021-12-21 LAB — RESP PANEL BY RT-PCR (FLU A&B, COVID) ARPGX2
Influenza A by PCR: NEGATIVE
Influenza B by PCR: NEGATIVE
SARS Coronavirus 2 by RT PCR: NEGATIVE

## 2021-12-21 LAB — LACTIC ACID, PLASMA: Lactic Acid, Venous: 0.9 mmol/L (ref 0.5–1.9)

## 2021-12-21 LAB — TROPONIN I (HIGH SENSITIVITY)
Troponin I (High Sensitivity): 3 ng/L (ref <18)
Troponin I (High Sensitivity): 4 ng/L (ref <18)

## 2021-12-21 MED ORDER — IOHEXOL 350 MG/ML SOLN
100.0000 mL | Freq: Once | INTRAVENOUS | Status: AC | PRN
  Administered 2021-12-21: 100 mL via INTRAVENOUS

## 2021-12-21 NOTE — H&P (Signed)
History and Physical    Patient: Nathaniel Hanson:[id number] DOB: [birth year] DOA: 12/21/2021 DOS: the patient was seen and examined on 12/22/2021 PCP: Creola Corn, MD  Patient coming from: Home  Chief Complaint:  Chief Complaint  Patient presents with   Syncopal Episode   HPI: Nathaniel Hanson is a 41 y.o. male with no significant past medical history presents with syncope.  Patient is limited historian.  Just says that he does not recall events of today and remained asleep with eyes closed throughout the entire evaluation. 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  He was in the backseat of his brothers car and reportedly was "blacking out" and had abnormal breathing sounds.  Reportedly had a dry cough for the past month with some generalized abdominal discomfort.  No fevers or chills.  Had last bowel movement yesterday.  In the ED, he was afebrile normotensive on room air.  CBC and BMP unremarkable.  Chest x-ray showed marked colonic dilatation at the level of the splenic flexure.  Dedicated CTA chest, abdomen pelvis was obtained revealed dilated sigmoid colon rotated into the abdomen compatible with sigmoid volvulus.  EKG on my review with SR and LVH.   EDP discussed with general surgery who recommended GI consult.  He done discussed with GI Dr. Ewing Schlein who recommends keeping him n.p.o. and will consult on him in the morning and may potentially need a sigmoidoscopy. Review of Systems: As mentioned in the history of present illness. All other systems reviewed and are negative. History reviewed. No pertinent past medical history. History reviewed. No pertinent surgical history. Social History:  reports that he has never smoked. He has never used smokeless tobacco. He reports current alcohol use. He reports that he does not use drugs.  No Known Allergies  Family History  Problem Relation Age of  Onset   Cancer Mother     Prior to Admission medications   Medication Sig Start Date End Date Taking? Authorizing Provider  benzonatate (TESSALON) 100 MG capsule Take 1 capsule (100 mg total) by mouth every 8 (eight) hours as needed for cough. Patient not taking: Reported on 12/21/2021 10/11/20   Gustavus Bryant, FNP  doxycycline (VIBRAMYCIN) 100 MG capsule Take 1 capsule (100 mg total) by mouth 2 (two) times Nathaniel. Patient not taking: Reported on 12/21/2021 10/14/20   Benjiman Core, MD  ibuprofen (ADVIL) 800 MG tablet Take 1 tablet (800 mg total) by mouth every 8 (eight) hours as needed. Patient not taking: Reported on 12/21/2021 10/03/18   Mickie Bail, NP    Physical Exam: Vitals:   12/21/21 1700 12/21/21 1800 12/21/21 2023 12/22/21 0052  BP: 127/83 (!) 174/130  104/69  Pulse: 87 65  70  Resp: 18 18  16   Temp:   98 F (36.7 C)   TempSrc:      SpO2: 98% 93%  98%   Constitutional: NAD, calm, comfortable Eyes: pt remained asleep with eyes closed most of evaluation ENMT: Mucous membranes are moist. Neck: normal, supple Respiratory: clear to auscultation bilaterally, no wheezing, no crackles. Normal respiratory effort. No accessory muscle use.  Cardiovascular: Regular rate and rhythm, no murmurs / rubs / gallops. No extremity edema.   Abdomen: firm distended but no tenderness, no masses palpated. Bowel sounds positive.  Musculoskeletal: no clubbing / cyanosis. No joint deformity upper and lower extremities. Normal muscle tone.  Skin: no rashes, lesions, ulcers.  Neurologic: CN  2-12 grossly intact.  Psychiatric:  Normal mood. Data Reviewed:  See HPI  Assessment and Plan: * Syncope - Patient unable provide much history regarding his syncopal episode.  Reportedly had loss of consciousness in the backseat of his brother's Car.  No noted seizure-like activity.  Patient had otherwise been well except for dry cough and some abdominal discomfort this month.  He was incidentally found to  have sigmoid volvulus on chest x-ray. -Possibly vasovagal secondary to sigmoid volvulus.  However we will still need to rule out cardiac cause with EKG showing LVH.  Troponin is reassuring at 3. -Keep on continuous telemetry and echocardiogram in the morning -Also check UDS   Sigmoid volvulus (HCC) - Patient otherwise asymptomatic but had incidental finding of sigmoid volvulus -General surgery consulted recommended GI consult.  GI Dr. Ewing Schlein has been consulted and recommends bowel rest and will consult in the morning possible sigmoidoscopy -Will obtain repeat abdominal x-ray in the morning      Advance Care Planning:   Code Status: Full Code   Consults: GI  Family Communication: None at bedside  Severity of Illness: The appropriate patient status for this patient is OBSERVATION. Observation status is judged to be reasonable and necessary in order to provide the required intensity of service to ensure the patient's safety. The patient's presenting symptoms, physical exam findings, and initial radiographic and laboratory data in the context of their medical condition is felt to place them at decreased risk for further clinical deterioration. Furthermore, it is anticipated that the patient will be medically stable for discharge from the hospital within 2 midnights of admission.   Author: Anselm Jungling, DO 12/22/2021 12:58 AM  For on call review [email or web address].

## 2021-12-21 NOTE — ED Provider Notes (Signed)
[facility]-EMERGENCY DEPT Provider Note   CSN: [id number] Arrival date & time: 12/20/21  2327     History  Chief Complaint  Patient presents with   Syncopal Episode    Nathaniel Hanson is a 41 y.o. male.  HPI   41 year old male with no significant medical history presenting to the emergency department after an episode of syncope.  The patient states that he has had a dry cough for the past month with some pleuritic chest discomfort.  He reportedly was walking back from the hospital game in the backseat of his brother's car and they state that he blacked out.  He states that he has also been having some generalized abdominal discomfort.  He denies any fevers or chills.  He states that his last bowel movement was on Monday.  He is not passing gas currently.  The patient states that his brother was in the car with him.  He was in the backseat watching something on his phone when he suddenly slumped over.  No generalized shaking.  No tongue biting or urinary incontinence.  He was slumped over for short period of time and then immediately came to with no postictal state.  He has had no further episodes since.  Currently denies any chest pain.  He has had pleuritic discomfort and a dry cough and shortness of breath over the past month.   Home Medications Prior to Admission medications   Medication Sig Start Date End Date Taking? Authorizing Provider  benzonatate (TESSALON) 100 MG capsule Take 1 capsule (100 mg total) by mouth every 8 (eight) hours as needed for cough. Patient not taking: Reported on 03/09/2021 10/11/20   Gustavus Bryant, FNP  doxycycline (VIBRAMYCIN) 100 MG capsule Take 1 capsule (100 mg total) by mouth 2 (two) times daily. Patient not taking: Reported on 03/09/2021 10/14/20   Benjiman Core, MD  ibuprofen (ADVIL) 800 MG tablet Take 1 tablet (800 mg total) by mouth every 8 (eight) hours as needed. Patient not taking: Reported on 03/09/2021 10/03/18   Mickie Bail, NP      Allergies    Patient has no known allergies.    Review of Systems   Review of Systems  All other systems reviewed and are negative.   Physical Exam Updated Vital Signs BP (!) 174/130   Pulse 65   Temp 98 F (36.7 C)   Resp 18   SpO2 93%  Physical Exam Vitals and nursing note reviewed.  Constitutional:      General: He is not in acute distress.    Appearance: He is well-developed.  HENT:     Head: Normocephalic and atraumatic.  Eyes:     Conjunctiva/sclera: Conjunctivae normal.  Cardiovascular:     Rate and Rhythm: Normal rate and regular rhythm.     Pulses: Normal pulses.  Pulmonary:     Effort: Pulmonary effort is normal. No respiratory distress.     Breath sounds: Normal breath sounds.  Abdominal:     Palpations: Abdomen is soft.     Tenderness: There is abdominal tenderness in the left upper quadrant and left lower quadrant. There is no guarding or rebound.     Comments: Minimal left sided abdominal tenderness  Musculoskeletal:        General: No swelling.     Cervical back: Neck supple.     Right lower leg: No edema.     Left lower leg: No edema.  Skin:    General: Skin  is warm and dry.     Capillary Refill: Capillary refill takes less than 2 seconds.  Neurological:     Mental Status: He is alert.     Comments: MENTAL STATUS EXAM:    Orientation: Alert and oriented to person, place and time.  Memory: Cooperative, follows commands well.  Language: Speech is clear and language is normal.   CRANIAL NERVES:    CN 2 (Optic): Visual fields intact to confrontation.  CN 3,4,6 (EOM): Pupils equal and reactive to light. Full extraocular eye movement without nystagmus.  CN 5 (Trigeminal): Facial sensation is normal, no weakness of masticatory muscles.  CN 7 (Facial): No facial weakness or asymmetry.  CN 8 (Auditory): Auditory acuity grossly normal.  CN 9,10 (Glossophar): The uvula is midline, the palate elevates symmetrically.  CN 11 (spinal access):  Normal sternocleidomastoid and trapezius strength.  CN 12 (Hypoglossal): The tongue is midline. No atrophy or fasciculations.Marland Kitchen   MOTOR:  Muscle Strength: 5/5RUE, 5/5LUE, 5/5RLE, 5/5LLE.   COORDINATION:   Intact finger-to-nose, no tremor.   SENSATION:   Intact to light touch all four extremities.     Psychiatric:        Mood and Affect: Mood normal.     ED Results / Procedures / Treatments   Labs (all labs ordered are listed, but only abnormal results are displayed) Labs Reviewed  RESP PANEL BY RT-PCR (FLU A&B, COVID) ARPGX2  BASIC METABOLIC PANEL  CBC  URINALYSIS, ROUTINE W REFLEX MICROSCOPIC  LACTIC ACID, PLASMA  LACTIC ACID, PLASMA  TROPONIN I (HIGH SENSITIVITY)  TROPONIN I (HIGH SENSITIVITY)    EKG EKG Interpretation  Date/Time:  Monday December 20 2021 23:52:19 EST Ventricular Rate:  89 PR Interval:  139 QRS Duration: 99 QT Interval:  348 QTC Calculation: 424 R Axis:   13 Text Interpretation: Sinus rhythm Left ventricular hypertrophy Confirmed by Palumbo, April ([id number]) on 12/21/2021 12:23:26 AM  Radiology CT Angio Chest PE W and/or Wo Contrast  Result Date: 12/21/2021 CLINICAL DATA:  Possible syncopal episode EXAM: CT ANGIOGRAPHY CHEST CT ABDOMEN AND PELVIS WITH CONTRAST TECHNIQUE: Multidetector CT imaging of the chest was performed using the standard protocol during bolus administration of intravenous contrast. Multiplanar CT image reconstructions and MIPs were obtained to evaluate the vascular anatomy. Multidetector CT imaging of the abdomen and pelvis was performed using the standard protocol during bolus administration of intravenous contrast. RADIATION DOSE REDUCTION: This exam was performed according to the departmental dose-optimization program which includes automated exposure control, adjustment of the mA and/or kV according to patient size and/or use of iterative reconstruction technique. CONTRAST:  OMNIPAQUE IOHEXOL 350 MG/ML SOLN COMPARISON:  None  Available. FINDINGS: CTA CHEST FINDINGS Cardiovascular: Satisfactory opacification of the pulmonary arteries to the segmental level. No evidence of pulmonary embolism. Normal caliber thoracic aorta without evidence of dissection. Normal heart size. No pericardial effusion. Mediastinum/Nodes: No enlarged mediastinal, hilar, or axillary lymph nodes. Thyroid gland, trachea, and esophagus demonstrate no significant findings. Lungs/Pleura: Lungs are clear. No pleural effusion or pneumothorax. Musculoskeletal: No chest wall abnormality. No acute or significant osseous findings. Review of the MIP images confirms the above findings. CT ABDOMEN and PELVIS FINDINGS Hepatobiliary: No focal liver abnormality is seen. No gallstones, gallbladder wall thickening, or biliary dilatation. Pancreas: Unremarkable. No pancreatic ductal dilatation or surrounding inflammatory changes. Spleen: Normal in size without focal abnormality. Adrenals/Urinary Tract: Bilateral adrenal glands appear normal. No hydronephrosis. Kidneys demonstrate symmetric enhancement. Urinary bladder is unremarkable for degree of distension. Stomach/Bowel: No radiopaque enteric contrast material  was administered. Stomach is unremarkable for degree of distension. No pathologic dilation of small bowel. The appendix and terminal ileum are grossly unremarkable. Gas-filled dilated sigmoid colon is rotated into the abdomen with abrupt narrowing of the proximal and distal portions on image 72/5 with fluid adjacent to this area of narrowing in the left lower quadrant on image 41/8. Additionally there is free fluid layering in the pelvis. Vascular/Lymphatic: Normal caliber abdominal aorta. No pathologically enlarged abdominal or pelvic lymph nodes. Reproductive: Prostate is unremarkable. Other: Free fluid in the pelvis and left pericolic gutter. No pneumoperitoneum. No portal venous gas. Musculoskeletal: L5-S1 discogenic disease. Review of the MIP images confirms the above  findings. IMPRESSION: 1. Gas-filled dilated sigmoid colon is rotated into the abdomen with abrupt collapse of the proximal and distal portions as well as fluid adjacent to the area of narrowing in the left pericolic gutter and layering in the pelvis. Findings compatible with sigmoid volvulus. No pneumatosis, pneumoperitoneum or portal venous gas. 2. No evidence of pulmonary embolus or thoracic aortic aneurysm/dissection. Electronically Signed   By: Maudry Mayhew M.D.   On: 12/21/2021 17:53   CT ABDOMEN PELVIS W CONTRAST  Result Date: 12/21/2021 CLINICAL DATA:  Possible syncopal episode EXAM: CT ANGIOGRAPHY CHEST CT ABDOMEN AND PELVIS WITH CONTRAST TECHNIQUE: Multidetector CT imaging of the chest was performed using the standard protocol during bolus administration of intravenous contrast. Multiplanar CT image reconstructions and MIPs were obtained to evaluate the vascular anatomy. Multidetector CT imaging of the abdomen and pelvis was performed using the standard protocol during bolus administration of intravenous contrast. RADIATION DOSE REDUCTION: This exam was performed according to the departmental dose-optimization program which includes automated exposure control, adjustment of the mA and/or kV according to patient size and/or use of iterative reconstruction technique. CONTRAST:  OMNIPAQUE IOHEXOL 350 MG/ML SOLN COMPARISON:  None Available. FINDINGS: CTA CHEST FINDINGS Cardiovascular: Satisfactory opacification of the pulmonary arteries to the segmental level. No evidence of pulmonary embolism. Normal caliber thoracic aorta without evidence of dissection. Normal heart size. No pericardial effusion. Mediastinum/Nodes: No enlarged mediastinal, hilar, or axillary lymph nodes. Thyroid gland, trachea, and esophagus demonstrate no significant findings. Lungs/Pleura: Lungs are clear. No pleural effusion or pneumothorax. Musculoskeletal: No chest wall abnormality. No acute or significant osseous findings.  Review of the MIP images confirms the above findings. CT ABDOMEN and PELVIS FINDINGS Hepatobiliary: No focal liver abnormality is seen. No gallstones, gallbladder wall thickening, or biliary dilatation. Pancreas: Unremarkable. No pancreatic ductal dilatation or surrounding inflammatory changes. Spleen: Normal in size without focal abnormality. Adrenals/Urinary Tract: Bilateral adrenal glands appear normal. No hydronephrosis. Kidneys demonstrate symmetric enhancement. Urinary bladder is unremarkable for degree of distension. Stomach/Bowel: No radiopaque enteric contrast material was administered. Stomach is unremarkable for degree of distension. No pathologic dilation of small bowel. The appendix and terminal ileum are grossly unremarkable. Gas-filled dilated sigmoid colon is rotated into the abdomen with abrupt narrowing of the proximal and distal portions on image 72/5 with fluid adjacent to this area of narrowing in the left lower quadrant on image 41/8. Additionally there is free fluid layering in the pelvis. Vascular/Lymphatic: Normal caliber abdominal aorta. No pathologically enlarged abdominal or pelvic lymph nodes. Reproductive: Prostate is unremarkable. Other: Free fluid in the pelvis and left pericolic gutter. No pneumoperitoneum. No portal venous gas. Musculoskeletal: L5-S1 discogenic disease. Review of the MIP images confirms the above findings. IMPRESSION: 1. Gas-filled dilated sigmoid colon is rotated into the abdomen with abrupt collapse of the proximal and distal portions as well  as fluid adjacent to the area of narrowing in the left pericolic gutter and layering in the pelvis. Findings compatible with sigmoid volvulus. No pneumatosis, pneumoperitoneum or portal venous gas. 2. No evidence of pulmonary embolus or thoracic aortic aneurysm/dissection. Electronically Signed   By: Maudry MayhewJeffrey  Waltz M.D.   On: 12/21/2021 17:53   DG Chest 2 View  Result Date: 12/21/2021 CLINICAL DATA:  Cough.  Shortness of  breath. EXAM: CHEST - 2 VIEW COMPARISON:  CXR 03/09/21 FINDINGS: No pleural effusion. No pneumothorax. Asymmetric elevation of the left hemidiaphragm. Compared to prior exam there may be a focal opacity at the left lung base, which is worrisome for infection. The visualized upper abdomen is notable for marked colonic dilatation at the level of the splenic flexure. IMPRESSION: 1. Possible focal airspace opacity at the left lung base could be seen in the setting of infection. 2. Marked colonic dilatation at the level of the splenic flexure, new from prior exam. Correlate with symptoms of abdominal pain and recommend further evaluation with a contrast enhanced CT of the abdomen and pelvis. Electronically Signed   By: Lorenza CambridgeHemant  Desai M.D.   On: 12/21/2021 09:28    Procedures Procedures    Medications Ordered in ED Medications  iohexol (OMNIPAQUE) 350 MG/ML injection 100 mL (100 mLs Intravenous Contrast Given 12/21/21 1723)    ED Course/ Medical Decision Making/ A&P                           Medical Decision Making Amount and/or Complexity of Data Reviewed Radiology: ordered.  Risk Prescription drug management. Decision regarding hospitalization.     41 year old male with no significant medical history presenting to the emergency department after an episode of syncope.  The patient states that he has had a dry cough for the past month with some pleuritic chest discomfort.  He reportedly was walking back from the hospital game in the backseat of his brother's car and they state that he blacked out.  He states that he has also been having some generalized abdominal discomfort.  He denies any fevers or chills.  He states that his last bowel movement was on Monday.  He is not passing gas currently.  The patient states that his brother was in the car with him.  He was in the backseat watching something on his phone when he suddenly slumped over.  No generalized shaking.  No tongue biting or urinary  incontinence.  He was slumped over for short period of time and then immediately came to with no postictal state.  He has had no further episodes since.  Currently denies any chest pain.  He has had pleuritic discomfort and a dry cough and shortness of breath over the past month.   On arrival, the patient was afebrile, mildly tachycardic P102, not tachypneic, BP 142/92, saturating 90% on room air.  Sinus rhythm noted on cardiac telemetry on my evaluation.  Physical exam significant for normal pulses, lungs clear to auscultation bilaterally, no lower extremity edema, normal neurologic exam.  Left-sided abdominal tenderness noted, no rebound or guarding.  The patient's EKG significant for sinus rhythm, ventricular rate 89, LVH present, no STEMI, no other abnormal intervals, QTc 44, QRS 99.  A chest x-ray was performed which revealed the following: IMPRESSION:  1. Possible focal airspace opacity at the left lung base could be  seen in the setting of infection.  2. Marked colonic dilatation at the level of the splenic flexure,  new from prior exam. Correlate with symptoms of abdominal pain and  recommend further evaluation with a contrast enhanced CT of the  abdomen and pelvis.    I am most concerned for cardiac arrhythmia vs dehydration vs hypoglycemia. The patient states that he had not had much to eat or drink for most of the day yesterday. I considered PE given the patient's monthslong history of pleuritic chest discomfort, dry cough and shortness of breath. I also considered aortic stenosis and HOCM.   I do not think this is due to pneumothorax, aortic dissection, ruptured AAA, anemia, seizure, stroke.  On exam, he has intact distal pulses in all extremities, normal capillary refill, a regular heart rate, and normal breath sounds. There is no carotid bruit, unusual abdominal mass, or heart murmur.  As per above, there are no focal neurologic deficits on exam and the patient can ambulate without  difficultly.  CTA PE study was performed and results were negative for PE or pneumonia, no pneumothorax.  CT abdomen pelvis surprisingly reveals evidence of sigmoid volvulus.  I consulted Dr. Cleophas Dunker of general surgery who recommended GI consultation as the patient is currently stable with minimal complaints.  Can consider sigmoidoscopy for management.  I spoke with Dr. Ewing Schlein of gastroenterology who felt that given the patient's minimal symptoms, the patient could be observed overnight and evaluated in the morning.  Laboratory evaluation significant for BMP unremarkable, CBC, troponin, COVID-19 and influenza unremarkable.  Feel that the patient would benefit from inpatient admission for cardiac telemetry in the setting of his syncopal episode with no prodrome yesterday.  While his symptom certainly could be explained by transient hypoglycemia or hypovolemia in the setting of dehydration, cardiac arrhythmia or structural cardiac abnormality cannot be ruled out.  The patient states that he has never had an ultrasound of the heart.  Due to this, hospitalist medicine was consulted for admission for observation under cardiac telemetry, follow-up GI consultation for finding of sigmoid volvulus on CT abdomen pelvis, consideration for TTE.  I believe that the patient would benefit from ongoing observation and a TTE to evaluate for cardiac causes of syncope. Therefore, hospitalist medicine was consulted for admission.   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

## 2021-12-21 NOTE — ED Triage Notes (Signed)
Arrives POV with possible syncopal episode.   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 Was in the back seat of his brothers car and they described him "blacking out" and making a funny sound/ breathing.   Denies seizure hx, denies any memory of even. Says he is back at baseline other than a cough he's had for a month.

## 2021-12-22 ENCOUNTER — Encounter (HOSPITAL_COMMUNITY): Payer: Self-pay | Admitting: Family Medicine

## 2021-12-22 ENCOUNTER — Observation Stay (HOSPITAL_BASED_OUTPATIENT_CLINIC_OR_DEPARTMENT_OTHER): Payer: Self-pay | Admitting: Certified Registered"

## 2021-12-22 ENCOUNTER — Observation Stay (HOSPITAL_COMMUNITY): Payer: Self-pay

## 2021-12-22 ENCOUNTER — Observation Stay (HOSPITAL_COMMUNITY): Payer: Self-pay | Admitting: Certified Registered"

## 2021-12-22 ENCOUNTER — Encounter (HOSPITAL_COMMUNITY)
Admission: EM | Disposition: A | Discharge status: Home / Self Care | Payer: Self-pay | Source: Home / Self Care | Attending: Internal Medicine

## 2021-12-22 DIAGNOSIS — K562 Volvulus: Secondary | ICD-10-CM

## 2021-12-22 DIAGNOSIS — R55 Syncope and collapse: Secondary | ICD-10-CM

## 2021-12-22 DIAGNOSIS — R058 Other specified cough: Secondary | ICD-10-CM

## 2021-12-22 DIAGNOSIS — E669 Obesity, unspecified: Secondary | ICD-10-CM

## 2021-12-22 HISTORY — PX: FLEXIBLE SIGMOIDOSCOPY: SHX5431

## 2021-12-22 HISTORY — PX: BOWEL DECOMPRESSION: SHX5532

## 2021-12-22 LAB — URINALYSIS, ROUTINE W REFLEX MICROSCOPIC
Bilirubin Urine: NEGATIVE
Glucose, UA: NEGATIVE mg/dL
Hgb urine dipstick: NEGATIVE
Ketones, ur: NEGATIVE mg/dL
Leukocytes,Ua: NEGATIVE
Nitrite: NEGATIVE
Protein, ur: NEGATIVE mg/dL
Specific Gravity, Urine: >1.046 — ABNORMAL HIGH (ref 1.005–1.030)
pH: 5 (ref 5.0–8.0)

## 2021-12-22 LAB — RAPID URINE DRUG SCREEN, HOSP PERFORMED
Amphetamines: NOT DETECTED
Barbiturates: NOT DETECTED
Benzodiazepines: NOT DETECTED
Cocaine: NOT DETECTED
Opiates: NOT DETECTED
Tetrahydrocannabinol: NOT DETECTED

## 2021-12-22 LAB — HIV ANTIBODY (ROUTINE TESTING W REFLEX): HIV Screen 4th Generation wRfx: NONREACTIVE

## 2021-12-22 LAB — ECHOCARDIOGRAM COMPLETE
AR max vel: 3.18 cm2
AV Area VTI: 3.17 cm2
AV Area mean vel: 2.8 cm2
AV Mean grad: 3 mmHg
AV Peak grad: 4.8 mmHg
Ao pk vel: 1.1 m/s
Area-P 1/2: 3.74 cm2
Height: 73 in
MV VTI: 2.42 cm2
S' Lateral: 2.8 cm
Weight: 4064 oz

## 2021-12-22 LAB — CBG MONITORING, ED: Glucose-Capillary: 82 mg/dL (ref 70–99)

## 2021-12-22 SURGERY — SIGMOIDOSCOPY, FLEXIBLE
Anesthesia: Monitor Anesthesia Care

## 2021-12-22 MED ORDER — SODIUM CHLORIDE 0.9 % IV SOLN: INTRAVENOUS | Status: DC

## 2021-12-22 MED ORDER — DEXMEDETOMIDINE HCL IN NACL 80 MCG/20ML IV SOLN
INTRAVENOUS | Status: AC
  Filled 2021-12-22: qty 20

## 2021-12-22 MED ORDER — PROPOFOL 1000 MG/100ML IV EMUL
INTRAVENOUS | Status: AC
  Filled 2021-12-22: qty 100

## 2021-12-22 MED ORDER — PROPOFOL 500 MG/50ML IV EMUL
INTRAVENOUS | Status: AC
  Filled 2021-12-22: qty 50

## 2021-12-22 MED ORDER — PROPOFOL 500 MG/50ML IV EMUL
INTRAVENOUS | Status: DC | PRN
  Administered 2021-12-22: 150 ug/kg/min via INTRAVENOUS

## 2021-12-22 MED ORDER — SODIUM CHLORIDE 0.9 % IV SOLN: INTRAVENOUS | Status: AC

## 2021-12-22 MED ORDER — SODIUM CHLORIDE 0.9% FLUSH
3.0000 mL | Freq: Two times a day (BID) | INTRAVENOUS | Status: DC
  Administered 2021-12-22 – 2022-01-17 (×19): 3 mL via INTRAVENOUS

## 2021-12-22 MED ORDER — LIDOCAINE HCL (CARDIAC) PF 100 MG/5ML IV SOSY
PREFILLED_SYRINGE | INTRAVENOUS | Status: DC | PRN
  Administered 2021-12-22: 100 mg via INTRAVENOUS

## 2021-12-22 MED ORDER — PHENYLEPHRINE 80 MCG/ML (10ML) SYRINGE FOR IV PUSH (FOR BLOOD PRESSURE SUPPORT)
PREFILLED_SYRINGE | INTRAVENOUS | Status: DC | PRN
  Administered 2021-12-22: 80 ug via INTRAVENOUS

## 2021-12-22 MED ORDER — PROPOFOL 10 MG/ML IV BOLUS
INTRAVENOUS | Status: DC | PRN
  Administered 2021-12-22: 30 mg via INTRAVENOUS
  Administered 2021-12-22: 20 mg via INTRAVENOUS

## 2021-12-22 NOTE — Progress Notes (Incomplete)
{  Select Note:[id number]} 

## 2021-12-22 NOTE — Assessment & Plan Note (Addendum)
-   Patient otherwise asymptomatic but had incidental finding of sigmoid volvulus -General surgery consulted recommended GI consult.  GI Dr. Ewing Schlein has been consulted and recommends bowel rest and will consult in the morning possible sigmoidoscopy -Will obtain repeat abdominal x-ray in the morning

## 2021-12-22 NOTE — Op Note (Signed)
[facility] Patient Name: Nathaniel Hanson Procedure Date: 12/22/2021 MRN: [id number] Attending MD: [name] , MD, [id number] Date of Birth: 1980/04/10 CSN: [id number] Age: 41 Admit Type: Outpatient Procedure:                Flexible Sigmoidoscopy Indications:              Volvulus Providers:                [name], MD, Dulcy Fanny,                            William Dalton, Technician Referring MD:             hospital team Medicines:                Propofol per Anesthesia, Monitored Anesthesia Care Complications:            No immediate complications. Estimated Blood Loss:     Estimated blood loss: none. Procedure:                Pre-Anesthesia Assessment:                           - Prior to the procedure, a History and Physical                            was performed, and patient medications and                            allergies were reviewed. The patient's tolerance of                            previous anesthesia was also reviewed. The risks                            and benefits of the procedure and the sedation                            options and risks were discussed with the patient.                            All questions were answered, and informed consent                            was obtained. Prior Anticoagulants: The patient has                            taken no anticoagulant or antiplatelet agents. ASA                            Grade Assessment: I - A normal, healthy patient.                            After reviewing the risks and benefits, the patient  was deemed in satisfactory condition to undergo the                            procedure.                           After obtaining informed consent, the scope was                            passed under direct vision. The [id number]-HQ190L                            ([id number] Olympus colonoscope was introduced                            through  the anus and advanced to the the splenic                            flexure. The flexible sigmoidoscopy was                            accomplished without difficulty. The patient                            tolerated the procedure well. The quality of the                            bowel preparation was an unprepped procedure. Scope In: 8:50:15 AM Scope Out: 8:58:08 AM Total Procedure Duration: 0 hours 7 minutes 53 seconds  Findings:      The perianal and digital rectal examinations were normal.      A volvulus with viable appearing mucosa was found in the sigmoid colon.       Decompression of the volvulus was attempted and was successful, with       complete decompression achieved. Estimated blood loss: none.      The colon (entire examined portion) appeared normal.      A large amount of semi-liquid semi-solid solid stool was found in the       rectum and in the recto-sigmoid colon, precluding visualization. Impression:               - Volvulus. Successful complete decompression                            achieved.                           - The entire examined colon is normal.                           - Stool in the rectum and in the recto-sigmoid                            colon.                           - No specimens collected. Moderate Sedation:  N/A - MAC procedure Recommendation:           - Clear liquid diet.                           - Admit the patient to hospital ward for ongoing                            care. Procedure Code(s):        --- Professional ---                           [phone], Sigmoidoscopy, flexible; with decompression                            (for pathologic distention) (eg, volvulus,                            megacolon), including placement of decompression                            tube, when performed Diagnosis Code(s):        --- Professional ---                           K56.2, Volvulus CPT copyright 2022 [employer]. All  rights reserved. The codes documented in this report are preliminary and upon coder review may  be revised to meet current compliance requirements. [name], MD 12/22/2021 9:13:15 AM This report has been signed electronically. Number of Addenda: 0

## 2021-12-22 NOTE — Consult Note (Signed)
Referring Provider: Dr. Cyndia Skeeters Primary Care Physician:  Shon Baton, MD Primary Gastroenterologist:  UNASSIGNED  Reason for Consultation:  Sigmoid Volvulus  HPI: Nathaniel Hanson is a 41 y.o. male presented with syncope and denies abdominal pain, N/V or change in bowel habits. Denies rectal bleeding. Has a formed stool 1-2 times per day. Denies previous abdominal surgeries and denies any previous diagnosis of volvulus. CT showed sigmoid volvulus.   History reviewed. No pertinent past medical history.  History reviewed. No pertinent surgical history.  Prior to Admission medications   Medication Sig Start Date End Date Taking? Authorizing Provider  benzonatate (TESSALON) 100 MG capsule Take 1 capsule (100 mg total) by mouth every 8 (eight) hours as needed for cough. Patient not taking: Reported on 12/21/2021 10/11/20   Teodora Medici, FNP  doxycycline (VIBRAMYCIN) 100 MG capsule Take 1 capsule (100 mg total) by mouth 2 (two) times daily. Patient not taking: Reported on 12/21/2021 10/14/20   Davonna Belling, MD  ibuprofen (ADVIL) 800 MG tablet Take 1 tablet (800 mg total) by mouth every 8 (eight) hours as needed. Patient not taking: Reported on 12/21/2021 10/03/18   Sharion Balloon, NP    Scheduled Meds:  [facility] Hold] sodium chloride flush  3 mL Intravenous Q12H   Continuous Infusions:  sodium chloride 75 mL/hr at 12/22/21 0051   PRN Meds:.  Allergies as of 12/20/2021   (No Known Allergies)    Family History  Problem Relation Age of Onset   Cancer Mother     Social History   Socioeconomic History   Marital status: Single    Spouse name: Not on file   Number of children: Not on file   Years of education: Not on file   Highest education level: Not on file  Occupational History   Not on file  Tobacco Use   Smoking status: Never   Smokeless tobacco: Never  Vaping Use   Vaping Use: Never used  Substance and Sexual Activity   Alcohol use: Yes    Comment: Rarely   Drug use: Never    Sexual activity: Not Currently  Other Topics Concern   Not on file  Social History Narrative   Not on file   Social Determinants of Health   Financial Resource Strain: Not on file  Food Insecurity: Not on file  Transportation Needs: Not on file  Physical Activity: Not on file  Stress: Not on file  Social Connections: Not on file  Intimate Partner Violence: Not on file    Review of Systems: All negative except as stated above in HPI.  Physical Exam: Vital signs: Vitals:   12/22/21 0456 12/22/21 0819  BP:    Pulse:  89  Resp:  (!) 22  Temp: 98.5 F (36.9 C) 98.3 F (36.8 C)  SpO2:  96%   Last BM Date : 12/20/21 General:   Alert,  Well-developed, well-nourished, pleasant and cooperative in NAD Head: normocephalic, atraumatic Eyes: anicteric sclera ENT: oropharynx clear Neck: supple, nontender Lungs:  Clear throughout to auscultation.   No wheezes, crackles, or rhonchi. No acute distress. Heart:  Regular rate and rhythm; no murmurs, clicks, rubs,  or gallops. Abdomen: soft, nontender, nondistended, +BS  Rectal:  Deferred Ext: no edema  GI:  Lab Results: Recent Labs    12/21/21 0029  WBC 9.2  HGB 14.7  HCT 43.9  PLT 252   BMET Recent Labs    12/21/21 0029  NA 138  K 3.7  CL 109  CO2  22  GLUCOSE 96  BUN 15  CREATININE 0.97  CALCIUM 9.0   LFT No results for input(s): "PROT", "ALBUMIN", "AST", "ALT", "ALKPHOS", "BILITOT", "BILIDIR", "IBILI" in the last 72 hours. PT/INR No results for input(s): "LABPROT", "INR" in the last 72 hours.   Studies/Results: DG Abd Portable 1V  Result Date: 12/22/2021 CLINICAL DATA:  Sigmoid volvulus (HCC) EXAM: PORTABLE ABDOMEN - 1 VIEW COMPARISON:  CT abdomen/pelvis 12/21/2021. FINDINGS: Findings of sigmoid volvulus, better characterized on recent CT abdomen/pelvis. The degree of gaseous distension of the sigmoid colon appears similar versus slightly progressed when comparing to prior scout. No obvious evidence of free  air on these limited scout radiographs. No abnormal calcifications identified. Multilevel degenerative change of the lumbar spine. IMPRESSION: Findings of sigmoid volvulus, better characterized on recent CT abdomen/pelvis. The degree of gaseous distension of the sigmoid colon appears similar versus slightly progressed when comparing to prior scout. Electronically Signed   By: Feliberto Harts M.D.   On: 12/22/2021 08:26   CT Angio Chest PE W and/or Wo Contrast  Result Date: 12/21/2021 CLINICAL DATA:  Possible syncopal episode EXAM: CT ANGIOGRAPHY CHEST CT ABDOMEN AND PELVIS WITH CONTRAST TECHNIQUE: Multidetector CT imaging of the chest was performed using the standard protocol during bolus administration of intravenous contrast. Multiplanar CT image reconstructions and MIPs were obtained to evaluate the vascular anatomy. Multidetector CT imaging of the abdomen and pelvis was performed using the standard protocol during bolus administration of intravenous contrast. RADIATION DOSE REDUCTION: This exam was performed according to the departmental dose-optimization program which includes automated exposure control, adjustment of the mA and/or kV according to patient size and/or use of iterative reconstruction technique. CONTRAST:  OMNIPAQUE IOHEXOL 350 MG/ML SOLN COMPARISON:  None Available. FINDINGS: CTA CHEST FINDINGS Cardiovascular: Satisfactory opacification of the pulmonary arteries to the segmental level. No evidence of pulmonary embolism. Normal caliber thoracic aorta without evidence of dissection. Normal heart size. No pericardial effusion. Mediastinum/Nodes: No enlarged mediastinal, hilar, or axillary lymph nodes. Thyroid gland, trachea, and esophagus demonstrate no significant findings. Lungs/Pleura: Lungs are clear. No pleural effusion or pneumothorax. Musculoskeletal: No chest wall abnormality. No acute or significant osseous findings. Review of the MIP images confirms the above findings. CT  ABDOMEN and PELVIS FINDINGS Hepatobiliary: No focal liver abnormality is seen. No gallstones, gallbladder wall thickening, or biliary dilatation. Pancreas: Unremarkable. No pancreatic ductal dilatation or surrounding inflammatory changes. Spleen: Normal in size without focal abnormality. Adrenals/Urinary Tract: Bilateral adrenal glands appear normal. No hydronephrosis. Kidneys demonstrate symmetric enhancement. Urinary bladder is unremarkable for degree of distension. Stomach/Bowel: No radiopaque enteric contrast material was administered. Stomach is unremarkable for degree of distension. No pathologic dilation of small bowel. The appendix and terminal ileum are grossly unremarkable. Gas-filled dilated sigmoid colon is rotated into the abdomen with abrupt narrowing of the proximal and distal portions on image 72/5 with fluid adjacent to this area of narrowing in the left lower quadrant on image 41/8. Additionally there is free fluid layering in the pelvis. Vascular/Lymphatic: Normal caliber abdominal aorta. No pathologically enlarged abdominal or pelvic lymph nodes. Reproductive: Prostate is unremarkable. Other: Free fluid in the pelvis and left pericolic gutter. No pneumoperitoneum. No portal venous gas. Musculoskeletal: L5-S1 discogenic disease. Review of the MIP images confirms the above findings. IMPRESSION: 1. Gas-filled dilated sigmoid colon is rotated into the abdomen with abrupt collapse of the proximal and distal portions as well as fluid adjacent to the area of narrowing in the left pericolic gutter and layering in the pelvis. Findings  compatible with sigmoid volvulus. No pneumatosis, pneumoperitoneum or portal venous gas. 2. No evidence of pulmonary embolus or thoracic aortic aneurysm/dissection. Electronically Signed   By: Dahlia Bailiff M.D.   On: 12/21/2021 17:53   CT ABDOMEN PELVIS W CONTRAST  Result Date: 12/21/2021 CLINICAL DATA:  Possible syncopal episode EXAM: CT ANGIOGRAPHY CHEST CT ABDOMEN  AND PELVIS WITH CONTRAST TECHNIQUE: Multidetector CT imaging of the chest was performed using the standard protocol during bolus administration of intravenous contrast. Multiplanar CT image reconstructions and MIPs were obtained to evaluate the vascular anatomy. Multidetector CT imaging of the abdomen and pelvis was performed using the standard protocol during bolus administration of intravenous contrast. RADIATION DOSE REDUCTION: This exam was performed according to the departmental dose-optimization program which includes automated exposure control, adjustment of the mA and/or kV according to patient size and/or use of iterative reconstruction technique. CONTRAST:  110mL OMNIPAQUE IOHEXOL 350 MG/ML SOLN COMPARISON:  None Available. FINDINGS: CTA CHEST FINDINGS Cardiovascular: Satisfactory opacification of the pulmonary arteries to the segmental level. No evidence of pulmonary embolism. Normal caliber thoracic aorta without evidence of dissection. Normal heart size. No pericardial effusion. Mediastinum/Nodes: No enlarged mediastinal, hilar, or axillary lymph nodes. Thyroid gland, trachea, and esophagus demonstrate no significant findings. Lungs/Pleura: Lungs are clear. No pleural effusion or pneumothorax. Musculoskeletal: No chest wall abnormality. No acute or significant osseous findings. Review of the MIP images confirms the above findings. CT ABDOMEN and PELVIS FINDINGS Hepatobiliary: No focal liver abnormality is seen. No gallstones, gallbladder wall thickening, or biliary dilatation. Pancreas: Unremarkable. No pancreatic ductal dilatation or surrounding inflammatory changes. Spleen: Normal in size without focal abnormality. Adrenals/Urinary Tract: Bilateral adrenal glands appear normal. No hydronephrosis. Kidneys demonstrate symmetric enhancement. Urinary bladder is unremarkable for degree of distension. Stomach/Bowel: No radiopaque enteric contrast material was administered. Stomach is unremarkable for degree  of distension. No pathologic dilation of small bowel. The appendix and terminal ileum are grossly unremarkable. Gas-filled dilated sigmoid colon is rotated into the abdomen with abrupt narrowing of the proximal and distal portions on image 72/5 with fluid adjacent to this area of narrowing in the left lower quadrant on image 41/8. Additionally there is free fluid layering in the pelvis. Vascular/Lymphatic: Normal caliber abdominal aorta. No pathologically enlarged abdominal or pelvic lymph nodes. Reproductive: Prostate is unremarkable. Other: Free fluid in the pelvis and left pericolic gutter. No pneumoperitoneum. No portal venous gas. Musculoskeletal: L5-S1 discogenic disease. Review of the MIP images confirms the above findings. IMPRESSION: 1. Gas-filled dilated sigmoid colon is rotated into the abdomen with abrupt collapse of the proximal and distal portions as well as fluid adjacent to the area of narrowing in the left pericolic gutter and layering in the pelvis. Findings compatible with sigmoid volvulus. No pneumatosis, pneumoperitoneum or portal venous gas. 2. No evidence of pulmonary embolus or thoracic aortic aneurysm/dissection. Electronically Signed   By: Dahlia Bailiff M.D.   On: 12/21/2021 17:53   DG Chest 2 View  Result Date: 12/21/2021 CLINICAL DATA:  Cough.  Shortness of breath. EXAM: CHEST - 2 VIEW COMPARISON:  CXR 03/09/21 FINDINGS: No pleural effusion. No pneumothorax. Asymmetric elevation of the left hemidiaphragm. Compared to prior exam there may be a focal opacity at the left lung base, which is worrisome for infection. The visualized upper abdomen is notable for marked colonic dilatation at the level of the splenic flexure. IMPRESSION: 1. Possible focal airspace opacity at the left lung base could be seen in the setting of infection. 2. Marked colonic dilatation at the level  of the splenic flexure, new from prior exam. Correlate with symptoms of abdominal pain and recommend further  evaluation with a contrast enhanced CT of the abdomen and pelvis. Electronically Signed   By: Marin Roberts M.D.   On: 12/21/2021 09:28    Impression/Plan: Sigmoid volvulus on CT without any abdominal pain, N/V, change in bowel habits, who presented with syncope. His history is not consistent with volvulus but will need a flex sig with colonic decompression based on CT findings.    LOS: 0 days   [name]  12/22/2021, 8:34 AM  Questions please call [phone]

## 2021-12-22 NOTE — H&P (View-Only) (Signed)
Referring Provider: Dr. Gonfa Primary Care Physician:  Russo, John, MD Primary Gastroenterologist:  UNASSIGNED  Reason for Consultation:  Sigmoid Volvulus  HPI: Nathaniel Hanson is a 41 y.o. male presented with syncope and denies abdominal pain, N/V or change in bowel habits. Denies rectal bleeding. Has a formed stool 1-2 times per day. Denies previous abdominal surgeries and denies any previous diagnosis of volvulus. CT showed sigmoid volvulus.   History reviewed. No pertinent past medical history.  History reviewed. No pertinent surgical history.  Prior to Admission medications   Medication Sig Start Date End Date Taking? Authorizing Provider  benzonatate (TESSALON) 100 MG capsule Take 1 capsule (100 mg total) by mouth every 8 (eight) hours as needed for cough. Patient not taking: Reported on 12/21/2021 10/11/20   Mound, Haley E, FNP  doxycycline (VIBRAMYCIN) 100 MG capsule Take 1 capsule (100 mg total) by mouth 2 (two) times daily. Patient not taking: Reported on 12/21/2021 10/14/20   Pickering, Nathan, MD  ibuprofen (ADVIL) 800 MG tablet Take 1 tablet (800 mg total) by mouth every 8 (eight) hours as needed. Patient not taking: Reported on 12/21/2021 10/03/18   Tate, Kelly H, NP    Scheduled Meds:  [MAR Hold] sodium chloride flush  3 mL Intravenous Q12H   Continuous Infusions:  sodium chloride 75 mL/hr at 12/22/21 0051   PRN Meds:.  Allergies as of 12/20/2021   (No Known Allergies)    Family History  Problem Relation Age of Onset   Cancer Mother     Social History   Socioeconomic History   Marital status: Single    Spouse name: Not on file   Number of children: Not on file   Years of education: Not on file   Highest education level: Not on file  Occupational History   Not on file  Tobacco Use   Smoking status: Never   Smokeless tobacco: Never  Vaping Use   Vaping Use: Never used  Substance and Sexual Activity   Alcohol use: Yes    Comment: Rarely   Drug use: Never    Sexual activity: Not Currently  Other Topics Concern   Not on file  Social History Narrative   Not on file   Social Determinants of Health   Financial Resource Strain: Not on file  Food Insecurity: Not on file  Transportation Needs: Not on file  Physical Activity: Not on file  Stress: Not on file  Social Connections: Not on file  Intimate Partner Violence: Not on file    Review of Systems: All negative except as stated above in HPI.  Physical Exam: Vital signs: Vitals:   12/22/21 0456 12/22/21 0819  BP:    Pulse:  89  Resp:  (!) 22  Temp: 98.5 F (36.9 C) 98.3 F (36.8 C)  SpO2:  96%   Last BM Date : 12/20/21 General:   Alert,  Well-developed, well-nourished, pleasant and cooperative in NAD Head: normocephalic, atraumatic Eyes: anicteric sclera ENT: oropharynx clear Neck: supple, nontender Lungs:  Clear throughout to auscultation.   No wheezes, crackles, or rhonchi. No acute distress. Heart:  Regular rate and rhythm; no murmurs, clicks, rubs,  or gallops. Abdomen: soft, nontender, nondistended, +BS  Rectal:  Deferred Ext: no edema  GI:  Lab Results: Recent Labs    12/21/21 0029  WBC 9.2  HGB 14.7  HCT 43.9  PLT 252   BMET Recent Labs    12/21/21 0029  NA 138  K 3.7  CL 109  CO2   22  GLUCOSE 96  BUN 15  CREATININE 0.97  CALCIUM 9.0   LFT No results for input(s): "PROT", "ALBUMIN", "AST", "ALT", "ALKPHOS", "BILITOT", "BILIDIR", "IBILI" in the last 72 hours. PT/INR No results for input(s): "LABPROT", "INR" in the last 72 hours.   Studies/Results: DG Abd Portable 1V  Result Date: 12/22/2021 CLINICAL DATA:  Sigmoid volvulus (HCC) EXAM: PORTABLE ABDOMEN - 1 VIEW COMPARISON:  CT abdomen/pelvis 12/21/2021. FINDINGS: Findings of sigmoid volvulus, better characterized on recent CT abdomen/pelvis. The degree of gaseous distension of the sigmoid colon appears similar versus slightly progressed when comparing to prior scout. No obvious evidence of free  air on these limited scout radiographs. No abnormal calcifications identified. Multilevel degenerative change of the lumbar spine. IMPRESSION: Findings of sigmoid volvulus, better characterized on recent CT abdomen/pelvis. The degree of gaseous distension of the sigmoid colon appears similar versus slightly progressed when comparing to prior scout. Electronically Signed   By: Feliberto Harts M.D.   On: 12/22/2021 08:26   CT Angio Chest PE W and/or Wo Contrast  Result Date: 12/21/2021 CLINICAL DATA:  Possible syncopal episode EXAM: CT ANGIOGRAPHY CHEST CT ABDOMEN AND PELVIS WITH CONTRAST TECHNIQUE: Multidetector CT imaging of the chest was performed using the standard protocol during bolus administration of intravenous contrast. Multiplanar CT image reconstructions and MIPs were obtained to evaluate the vascular anatomy. Multidetector CT imaging of the abdomen and pelvis was performed using the standard protocol during bolus administration of intravenous contrast. RADIATION DOSE REDUCTION: This exam was performed according to the departmental dose-optimization program which includes automated exposure control, adjustment of the mA and/or kV according to patient size and/or use of iterative reconstruction technique. CONTRAST:  OMNIPAQUE IOHEXOL 350 MG/ML SOLN COMPARISON:  None Available. FINDINGS: CTA CHEST FINDINGS Cardiovascular: Satisfactory opacification of the pulmonary arteries to the segmental level. No evidence of pulmonary embolism. Normal caliber thoracic aorta without evidence of dissection. Normal heart size. No pericardial effusion. Mediastinum/Nodes: No enlarged mediastinal, hilar, or axillary lymph nodes. Thyroid gland, trachea, and esophagus demonstrate no significant findings. Lungs/Pleura: Lungs are clear. No pleural effusion or pneumothorax. Musculoskeletal: No chest wall abnormality. No acute or significant osseous findings. Review of the MIP images confirms the above findings. CT  ABDOMEN and PELVIS FINDINGS Hepatobiliary: No focal liver abnormality is seen. No gallstones, gallbladder wall thickening, or biliary dilatation. Pancreas: Unremarkable. No pancreatic ductal dilatation or surrounding inflammatory changes. Spleen: Normal in size without focal abnormality. Adrenals/Urinary Tract: Bilateral adrenal glands appear normal. No hydronephrosis. Kidneys demonstrate symmetric enhancement. Urinary bladder is unremarkable for degree of distension. Stomach/Bowel: No radiopaque enteric contrast material was administered. Stomach is unremarkable for degree of distension. No pathologic dilation of small bowel. The appendix and terminal ileum are grossly unremarkable. Gas-filled dilated sigmoid colon is rotated into the abdomen with abrupt narrowing of the proximal and distal portions on image 72/5 with fluid adjacent to this area of narrowing in the left lower quadrant on image 41/8. Additionally there is free fluid layering in the pelvis. Vascular/Lymphatic: Normal caliber abdominal aorta. No pathologically enlarged abdominal or pelvic lymph nodes. Reproductive: Prostate is unremarkable. Other: Free fluid in the pelvis and left pericolic gutter. No pneumoperitoneum. No portal venous gas. Musculoskeletal: L5-S1 discogenic disease. Review of the MIP images confirms the above findings. IMPRESSION: 1. Gas-filled dilated sigmoid colon is rotated into the abdomen with abrupt collapse of the proximal and distal portions as well as fluid adjacent to the area of narrowing in the left pericolic gutter and layering in the pelvis. Findings  compatible with sigmoid volvulus. No pneumatosis, pneumoperitoneum or portal venous gas. 2. No evidence of pulmonary embolus or thoracic aortic aneurysm/dissection. Electronically Signed   By: Jeffrey  Waltz M.D.   On: 12/21/2021 17:53   CT ABDOMEN PELVIS W CONTRAST  Result Date: 12/21/2021 CLINICAL DATA:  Possible syncopal episode EXAM: CT ANGIOGRAPHY CHEST CT ABDOMEN  AND PELVIS WITH CONTRAST TECHNIQUE: Multidetector CT imaging of the chest was performed using the standard protocol during bolus administration of intravenous contrast. Multiplanar CT image reconstructions and MIPs were obtained to evaluate the vascular anatomy. Multidetector CT imaging of the abdomen and pelvis was performed using the standard protocol during bolus administration of intravenous contrast. RADIATION DOSE REDUCTION: This exam was performed according to the departmental dose-optimization program which includes automated exposure control, adjustment of the mA and/or kV according to patient size and/or use of iterative reconstruction technique. CONTRAST:  100mL OMNIPAQUE IOHEXOL 350 MG/ML SOLN COMPARISON:  None Available. FINDINGS: CTA CHEST FINDINGS Cardiovascular: Satisfactory opacification of the pulmonary arteries to the segmental level. No evidence of pulmonary embolism. Normal caliber thoracic aorta without evidence of dissection. Normal heart size. No pericardial effusion. Mediastinum/Nodes: No enlarged mediastinal, hilar, or axillary lymph nodes. Thyroid gland, trachea, and esophagus demonstrate no significant findings. Lungs/Pleura: Lungs are clear. No pleural effusion or pneumothorax. Musculoskeletal: No chest wall abnormality. No acute or significant osseous findings. Review of the MIP images confirms the above findings. CT ABDOMEN and PELVIS FINDINGS Hepatobiliary: No focal liver abnormality is seen. No gallstones, gallbladder wall thickening, or biliary dilatation. Pancreas: Unremarkable. No pancreatic ductal dilatation or surrounding inflammatory changes. Spleen: Normal in size without focal abnormality. Adrenals/Urinary Tract: Bilateral adrenal glands appear normal. No hydronephrosis. Kidneys demonstrate symmetric enhancement. Urinary bladder is unremarkable for degree of distension. Stomach/Bowel: No radiopaque enteric contrast material was administered. Stomach is unremarkable for degree  of distension. No pathologic dilation of small bowel. The appendix and terminal ileum are grossly unremarkable. Gas-filled dilated sigmoid colon is rotated into the abdomen with abrupt narrowing of the proximal and distal portions on image 72/5 with fluid adjacent to this area of narrowing in the left lower quadrant on image 41/8. Additionally there is free fluid layering in the pelvis. Vascular/Lymphatic: Normal caliber abdominal aorta. No pathologically enlarged abdominal or pelvic lymph nodes. Reproductive: Prostate is unremarkable. Other: Free fluid in the pelvis and left pericolic gutter. No pneumoperitoneum. No portal venous gas. Musculoskeletal: L5-S1 discogenic disease. Review of the MIP images confirms the above findings. IMPRESSION: 1. Gas-filled dilated sigmoid colon is rotated into the abdomen with abrupt collapse of the proximal and distal portions as well as fluid adjacent to the area of narrowing in the left pericolic gutter and layering in the pelvis. Findings compatible with sigmoid volvulus. No pneumatosis, pneumoperitoneum or portal venous gas. 2. No evidence of pulmonary embolus or thoracic aortic aneurysm/dissection. Electronically Signed   By: Jeffrey  Waltz M.D.   On: 12/21/2021 17:53   DG Chest 2 View  Result Date: 12/21/2021 CLINICAL DATA:  Cough.  Shortness of breath. EXAM: CHEST - 2 VIEW COMPARISON:  CXR 03/09/21 FINDINGS: No pleural effusion. No pneumothorax. Asymmetric elevation of the left hemidiaphragm. Compared to prior exam there may be a focal opacity at the left lung base, which is worrisome for infection. The visualized upper abdomen is notable for marked colonic dilatation at the level of the splenic flexure. IMPRESSION: 1. Possible focal airspace opacity at the left lung base could be seen in the setting of infection. 2. Marked colonic dilatation at the level   of the splenic flexure, new from prior exam. Correlate with symptoms of abdominal pain and recommend further  evaluation with a contrast enhanced CT of the abdomen and pelvis. Electronically Signed   By: Hemant  Desai M.D.   On: 12/21/2021 09:28    Impression/Plan: Sigmoid volvulus on CT without any abdominal pain, N/V, change in bowel habits, who presented with syncope. His history is not consistent with volvulus but will need a flex sig with colonic decompression based on CT findings.    LOS: 0 days   Bernell Sigal C Yohana Bartha  12/22/2021, 8:34 AM  Questions please call [phone]  

## 2021-12-22 NOTE — ED Notes (Signed)
Patient transported to ENDO 

## 2021-12-22 NOTE — Interval H&P Note (Signed)
History and Physical Interval Note:  12/22/2021 8:41 AM  Nathaniel Hanson  has presented today for surgery, with the diagnosis of sigmoid volvulus.  The various methods of treatment have been discussed with the patient and family. After consideration of risks, benefits and other options for treatment, the patient has consented to  Procedure(s): FLEXIBLE SIGMOIDOSCOPY (N/A) as a surgical intervention.  The patient's history has been reviewed, patient examined, no change in status, stable for surgery.  I have reviewed the patient's chart and labs.  Questions were answered to the patient's satisfaction.     Shirley Friar

## 2021-12-22 NOTE — Anesthesia Preprocedure Evaluation (Addendum)
Anesthesia Evaluation  Patient identified by MRN, date of birth, ID band Patient awake    Reviewed: Allergy & Precautions, NPO status , Patient's Chart, lab work & pertinent test results  History of Anesthesia Complications Negative for: history of anesthetic complications  Airway Mallampati: II  TM Distance: >3 FB Neck ROM: Full    Dental  (+) Dental Advisory Given, Teeth Intact   Pulmonary neg pulmonary ROS   Pulmonary exam normal        Cardiovascular negative cardio ROS Normal cardiovascular exam     Neuro/Psych negative neurological ROS  negative psych ROS   GI/Hepatic negative GI ROS, Neg liver ROS,,,  Endo/Other  negative endocrine ROS    Renal/GU negative Renal ROS     Musculoskeletal negative musculoskeletal ROS (+)    Abdominal   Peds  Hematology negative hematology ROS (+)   Anesthesia Other Findings Unexplained syncopal episode, ?vasovagal vs. seizure (no witnessed seizure activity)  Reproductive/Obstetrics                              Anesthesia Physical Anesthesia Plan  ASA: 1  Anesthesia Plan: MAC   Post-op Pain Management: Minimal or no pain anticipated   Induction:   PONV Risk Score and Plan: 1 and Propofol infusion and Treatment may vary due to age or medical condition  Airway Management Planned: Nasal Cannula and Natural Airway  Additional Equipment: None  Intra-op Plan:   Post-operative Plan:   Informed Consent: I have reviewed the patients History and Physical, chart, labs and discussed the procedure including the risks, benefits and alternatives for the proposed anesthesia with the patient or authorized representative who has indicated his/her understanding and acceptance.       Plan Discussed with: CRNA and Anesthesiologist  Anesthesia Plan Comments:         Anesthesia Quick Evaluation

## 2021-12-22 NOTE — Progress Notes (Signed)
*  PRELIMINARY RESULTS* Echocardiogram 2D Echocardiogram has been performed.  [name] 12/22/2021, 2:11 PM

## 2021-12-22 NOTE — Consult Note (Signed)
Consulting Physician: Hyman Hopes Cardell Rachel  Referring Provider: Dr. Karene Fry - ER Provider  Chief Complaint: Syncope  Reason for Consult: Sigmoid volvulus   Subjective   HPI: Nathaniel Hanson is an 41 y.o. male who is here for syncopal episodes.  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  He doesn't eat much game day.  He passed out Monday night after not eating all day.  He denies abdominal pain.  He has had normal bowel movements.  His last bowel movement was Monday.    History reviewed. No pertinent past medical history.  History reviewed. No pertinent surgical history.  Family History  Problem Relation Age of Onset   Cancer Mother     Social:  reports that he has never smoked. He has never used smokeless tobacco. He reports current alcohol use. He reports that he does not use drugs.  Allergies: No Known Allergies  Medications: Current Outpatient Medications  Medication Instructions   benzonatate (TESSALON) 100 mg, Oral, Every 8 hours PRN   doxycycline (VIBRAMYCIN) 100 mg, Oral, 2 times daily   ibuprofen (ADVIL) 800 mg, Oral, Every 8 hours PRN    ROS - all of the below systems have been reviewed with the patient and positives are indicated with bold text General: chills, fever or night sweats Eyes: blurry vision or double vision ENT: epistaxis or sore throat Allergy/Immunology: itchy/watery eyes or nasal congestion Hematologic/Lymphatic: bleeding problems, blood clots or swollen lymph nodes Endocrine: temperature intolerance or unexpected weight changes Breast: new or changing breast lumps or nipple discharge Resp: cough, shortness of breath, or wheezing CV: chest pain or dyspnea on exertion GI: as per HPI GU: dysuria, trouble voiding, or hematuria MSK: joint pain or joint stiffness Neuro: TIA or stroke symptoms Derm: pruritus and skin lesion changes Psych: anxiety and depression  Objective   PE Blood pressure 120/76, pulse 89, temperature  98.3 F (36.8 C), temperature source Temporal, resp. rate (!) 22, height 6\' 1"  (1.854 m), weight 115.2 kg, SpO2 96 %. Constitutional: NAD; conversant; no deformities Eyes: Moist conjunctiva; no lid lag; anicteric; PERRL Neck: Trachea midline; no thyromegaly Lungs: Normal respiratory effort; no tactile fremitus CV: RRR; no palpable thrills; no pitting edema GI: Abd Soft, nontender; no palpable hepatosplenomegaly MSK: Normal range of motion of extremities; no clubbing/cyanosis Psychiatric: Appropriate affect; alert and oriented x3 Lymphatic: No palpable cervical or axillary lymphadenopathy  Results for orders placed or performed during the hospital encounter of 12/21/21 (from the past 24 hour(s))  Troponin I (High Sensitivity)     Status: None   Collection Time: 12/21/21  4:23 PM  Result Value Ref Range   Troponin I (High Sensitivity) 3 <18 ng/L  Troponin I (High Sensitivity)     Status: None   Collection Time: 12/21/21  7:11 PM  Result Value Ref Range   Troponin I (High Sensitivity) 4 <18 ng/L  Lactic acid, plasma     Status: None   Collection Time: 12/21/21  7:18 PM  Result Value Ref Range   Lactic Acid, Venous 0.9 0.5 - 1.9 mmol/L  CBG monitoring, ED     Status: None   Collection Time: 12/22/21  4:53 AM  Result Value Ref Range   Glucose-Capillary 82 70 - 99 mg/dL  Rapid urine drug screen (hospital performed)     Status: None   Collection Time: 12/22/21  5:06 AM  Result Value Ref Range   Opiates NONE DETECTED NONE DETECTED   Cocaine NONE DETECTED NONE DETECTED  Benzodiazepines NONE DETECTED NONE DETECTED   Amphetamines NONE DETECTED NONE DETECTED   Tetrahydrocannabinol NONE DETECTED NONE DETECTED   Barbiturates NONE DETECTED NONE DETECTED     Imaging Orders         DG Chest 2 View         CT Angio Chest PE W and/or Wo Contrast         CT ABDOMEN PELVIS W CONTRAST         DG Abd Portable 1V      Assessment and Plan   Nathaniel Hanson is an 41 y.o. male with sigmoid  volvulus.  His symptoms do not seem consistent with sigmoid volvulus.  He is being admitted to medicine and a endoscopy is being performed.  The surgery team will follow along.  He may need sigmoid colectomy during this admission, but it will be important to rule out other causes of syncope or explanation for his presentation prior to proceeding with surgery.  I will be interested to see the results of the colonoscopy.    ICD-10-CM   1. Syncope and collapse  R55     2. Sigmoid volvulus (HCC)  K56.2        Quentin Ore, MD  [facility] Use [email or web address] to contact on call provider  New Patient Billing: [id number] - High MDM

## 2021-12-22 NOTE — Transfer of Care (Signed)
Immediate Anesthesia Transfer of Care Note  Patient: Nathaniel Hanson  Procedure(s) Performed: FLEXIBLE SIGMOIDOSCOPY  Patient Location: PACU  Anesthesia Type:MAC  Level of Consciousness: drowsy and patient cooperative  Airway & Oxygen Therapy: Patient Spontanous Breathing  Post-op Assessment: Report given to RN and Post -op Vital signs reviewed and stable  Post vital signs: Reviewed and stable  Last Vitals:  Vitals Value Taken Time  BP 93/41 12/22/21 0904  Temp    Pulse 80 12/22/21 0904  Resp 18 12/22/21 0904  SpO2 93 % 12/22/21 0904    Last Pain:  Vitals:   12/22/21 0904  TempSrc:   PainSc: Asleep         Complications: No notable events documented.

## 2021-12-22 NOTE — Anesthesia Postprocedure Evaluation (Signed)
Anesthesia Post Note  Patient: Valentin R Samek  Procedure(s) Performed: [place]     Patient location during evaluation: PACU Anesthesia Type: MAC Level of consciousness: awake and alert Pain management: pain level controlled Vital Signs Assessment: post-procedure vital signs reviewed and stable Respiratory status: spontaneous breathing, nonlabored ventilation and respiratory function stable Cardiovascular status: stable and blood pressure returned to baseline Anesthetic complications: no   No notable events documented.  Last Vitals:  Vitals:   12/22/21 0914 12/22/21 0925  BP: (!) 102/49 124/71  Pulse: 81 81  Resp: 16 (!) 27  Temp:    SpO2: 93% 94%    Last Pain:  Vitals:   12/22/21 0925  TempSrc:   PainSc: 0-No pain                 Audry Pili
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
## 2021-12-22 NOTE — Progress Notes (Addendum)
PROGRESS NOTE  Nathaniel Hanson KYH:[id number] DOB: 1980-04-07   PCP: Creola Corn, MD  Patient is from: Home.  DOA: 12/21/2021 LOS: 0  Chief complaints Chief Complaint  Patient presents with   Syncopal Episode     Brief Narrative / Interim history: 41 year old M with no significant past medical history presenting with what looks like a syncopal episode and found to have sigmoid volvulus in ED.  General surgery and GI consulted.  Patient underwent flex sigmoidoscopy for sigmoid volvulus was on 12/6.  Syncope work-up including serial troponin, EKG, telemetry and echocardiogram unrevealing.  Syncope felt to be vasovagal.   Subjective: Seen and examined earlier this morning after he had flexible endoscopy this morning.  No complaints but asking question about his chronic cough.  Cough is worse at night.  Has not had any last night or this morning.  No shortness of breath.  Objective: Vitals:   12/22/21 0914 12/22/21 0925 12/22/21 1257 12/22/21 1419  BP: (!) 102/49 124/71 (!) 118/54 (!) 123/90  Pulse: 81 81 78 81  Resp: 16 (!) 27 18 16   Temp:   98.5 F (36.9 C) 97.6 F (36.4 C)  TempSrc:   Oral   SpO2: 93% 94% 99% 100%  Weight:      Height:        Examination:  GENERAL: No apparent distress.  Nontoxic. HEENT: MMM.  Vision and hearing grossly intact.  NECK: Supple.  No apparent JVD.  RESP:  No IWOB.  Fair aeration bilaterally. CVS:  RRR. Heart sounds normal.  ABD/GI/GU: BS+. Abd soft, NTND.  MSK/EXT:  Moves extremities. No apparent deformity. No edema.  SKIN: no apparent skin lesion or wound NEURO: Awake, alert and oriented appropriately.  No apparent focal neuro deficit. PSYCH: Calm. Normal affect.   Procedures:  12/6-Flex sigmoidoscopy  Microbiology summarized: COVID-19 and influenza PCR nonreactive  Assessment and plan: Principal Problem:   Syncope Active Problems:   Sigmoid volvulus (HCC)  Syncope: Work-up including serial troponin, EKG, telemetry and  echocardiogram reassuring.  No significant cardiopulmonary finding on CT chest.  No significant personal or family history of cardiac disease.  No exertional dyspnea, syncope or chest pain.  Could be vasovagal from sigmoid volvulus.  -Continue telemetry monitoring  Sigmoid volvulus: Incidental finding. S/p flex sigmoidoscopy  but KUB shows persistent sigmoid volvulus. -General surgery following.  Sigmoid colectomy? -On clear liquid diet per GI  Nocturnal cough: Could be due to allergy or/and/GERD.  CT chest, abdomen and pelvis without acute finding other than sigmoid volvulus. -Would like to hold off medications for now -Encouraged lifestyle change  Obesity Body mass index is 33.51 kg/m. -Encouraged lifestyle change.          DVT prophylaxis:  SCDs Start: 12/22/21 0028  Code Status: Full code Family Communication: None at bedside Level of care: Telemetry Status is: Observation The patient will require care spanning > 2 midnights and should be moved to inpatient because: Sigmoid volvulus   Final disposition: Home Consultants:  General surgery Gastroenterology  Sch Meds:  Scheduled Meds:  sodium chloride flush  3 mL Intravenous Q12H   Continuous Infusions: PRN Meds:.  Antimicrobials: Anti-infectives (From admission, onward)    None        I have personally reviewed the following labs and images: CBC: Recent Labs  Lab 12/21/21 0029  WBC 9.2  HGB 14.7  HCT 43.9  MCV 83.8  PLT 252   BMP &GFR Recent Labs  Lab 12/21/21 0029  NA 138  K 3.7  CL 109  CO2 22  GLUCOSE 96  BUN 15  CREATININE 0.97  CALCIUM 9.0   Estimated Creatinine Clearance: 134.6 mL/min (by C-G formula based on SCr of 0.97 mg/dL). Liver & Pancreas: No results for input(s): "AST", "ALT", "ALKPHOS", "BILITOT", "PROT", "ALBUMIN" in the last 168 hours. No results for input(s): "LIPASE", "AMYLASE" in the last 168 hours. No results for input(s): "AMMONIA" in the last 168  hours. Diabetic: No results for input(s): "HGBA1C" in the last 72 hours. Recent Labs  Lab 12/22/21 0453  GLUCAP 82   Cardiac Enzymes: No results for input(s): "CKTOTAL", "CKMB", "CKMBINDEX", "TROPONINI" in the last 168 hours. No results for input(s): "PROBNP" in the last 8760 hours. Coagulation Profile: No results for input(s): "INR", "PROTIME" in the last 168 hours. Thyroid Function Tests: No results for input(s): "TSH", "T4TOTAL", "FREET4", "T3FREE", "THYROIDAB" in the last 72 hours. Lipid Profile: No results for input(s): "CHOL", "HDL", "LDLCALC", "TRIG", "CHOLHDL", "LDLDIRECT" in the last 72 hours. Anemia Panel: No results for input(s): "VITAMINB12", "FOLATE", "FERRITIN", "TIBC", "IRON", "RETICCTPCT" in the last 72 hours. Urine analysis:    Component Value Date/Time   COLORURINE YELLOW 12/21/2021 0631   APPEARANCEUR CLEAR 12/21/2021 0631   LABSPEC >1.046 (H) 12/21/2021 0631   PHURINE 5.0 12/21/2021 0631   GLUCOSEU NEGATIVE 12/21/2021 0631   HGBUR NEGATIVE 12/21/2021 0631   BILIRUBINUR NEGATIVE 12/21/2021 0631   KETONESUR NEGATIVE 12/21/2021 0631   PROTEINUR NEGATIVE 12/21/2021 0631   NITRITE NEGATIVE 12/21/2021 0631   LEUKOCYTESUR NEGATIVE 12/21/2021 0631   Sepsis Labs: Invalid input(s): "PROCALCITONIN", "LACTICIDVEN"  Microbiology: Recent Results (from the past 240 hour(s))  Resp Panel by RT-PCR (Flu A&B, Covid) Anterior Nasal Swab     Status: None   Collection Time: 12/21/21 12:29 AM   Specimen: Anterior Nasal Swab  Result Value Ref Range Status   SARS Coronavirus 2 by RT PCR NEGATIVE NEGATIVE Final    Comment: (NOTE) SARS-CoV-2 target nucleic acids are NOT DETECTED.  The SARS-CoV-2 RNA is generally detectable in upper respiratory specimens during the acute phase of infection. The lowest concentration of SARS-CoV-2 viral copies this assay can detect is 138 copies/mL. A negative result does not preclude SARS-Cov-2 infection and should not be used as the sole  basis for treatment or other patient management decisions. A negative result may occur with  improper specimen collection/handling, submission of specimen other than nasopharyngeal swab, presence of viral mutation(s) within the areas targeted by this assay, and inadequate number of viral copies(<138 copies/mL). A negative result must be combined with clinical observations, patient history, and epidemiological information. The expected result is Negative.  Fact Sheet for Patients:  BloggerCourse.com  Fact Sheet for Healthcare Providers:  SeriousBroker.it  This test is no t yet approved or cleared by the [place] FDA and  has been authorized for detection and/or diagnosis of SARS-CoV-2 by FDA under an Emergency Use Authorization (EUA). This EUA will remain  in effect (meaning this test can be used) for the duration of the COVID-19 declaration under Section 564(b)(1) of the Act, 21 U.S.C.section 360bbb-3(b)(1), unless the authorization is terminated  or revoked sooner.       Influenza A by PCR NEGATIVE NEGATIVE Final   Influenza B by PCR NEGATIVE NEGATIVE Final    Comment: (NOTE) The Xpert Xpress SARS-CoV-2/FLU/RSV plus assay is intended as an aid in the diagnosis of influenza from Nasopharyngeal swab specimens and should not be used as a sole basis for treatment. Nasal washings and aspirates are unacceptable for Xpert Xpress SARS-CoV-2/FLU/RSV  testing.  Fact Sheet for Patients: BloggerCourse.com  Fact Sheet for Healthcare Providers: SeriousBroker.it  This test is not yet approved or cleared by the [place] FDA and has been authorized for detection and/or diagnosis of SARS-CoV-2 by FDA under an Emergency Use Authorization (EUA). This EUA will remain in effect (meaning this test can be used) for the duration of the COVID-19 declaration under Section 564(b)(1) of the Act,  21 U.S.C. section 360bbb-3(b)(1), unless the authorization is terminated or revoked.  Performed at [facility], [address] [place], [place] [address]     Radiology Studies: ECHOCARDIOGRAM COMPLETE  Result Date: 12/22/2021    ECHOCARDIOGRAM REPORT   Patient Name:   Nathaniel Hanson Date of Exam: 12/22/2021 Medical Rec #:  [id number]      Height:       73.0 in Accession #:    [id number]     Weight:       254.0 lb Date of Birth:  1980/05/03      BSA:          2.382 m Patient Age:    41 years       BP:           124/71 mmHg Patient Gender: M              HR:           75 bpm. Exam Location:  Inpatient Procedure: 2D Echo, Cardiac Doppler and Color Doppler Indications:    Syncope  History:        Patient has no prior history of Echocardiogram examinations.                 Signs/Symptoms:Syncope.  Sonographer:    Mikki Harbor Referring Phys: [id number] CHING T TU IMPRESSIONS  1. Left ventricular ejection fraction, by estimation, is 55%. The left ventricle has normal function. The left ventricle has no regional wall motion abnormalities. Left ventricular diastolic parameters were normal.  2. Right ventricular systolic function is normal. The right ventricular size is normal. Tricuspid regurgitation signal is inadequate for assessing PA pressure.  3. The mitral valve is normal in structure. Trivial mitral valve regurgitation. No evidence of mitral stenosis.  4. The aortic valve is tricuspid. Aortic valve regurgitation is not visualized. No aortic stenosis is present.  5. IVC not visualized. FINDINGS  Left Ventricle: Left ventricular ejection fraction, by estimation, is 55%. The left ventricle has normal function. The left ventricle has no regional wall motion abnormalities. The left ventricular internal cavity size was normal in size. There is no left ventricular hypertrophy. Left ventricular diastolic parameters were normal. Right Ventricle: The right ventricular size is normal. No increase  in right ventricular wall thickness. Right ventricular systolic function is normal. Tricuspid regurgitation signal is inadequate for assessing PA pressure. Left Atrium: Left atrial size was normal in size. Right Atrium: Right atrial size was normal in size. Pericardium: There is no evidence of pericardial effusion. Mitral Valve: The mitral valve is normal in structure. Trivial mitral valve regurgitation. No evidence of mitral valve stenosis. MV peak gradient, 3.4 mmHg. The mean mitral valve gradient is 2.0 mmHg. Tricuspid Valve: The tricuspid valve is normal in structure. Tricuspid valve regurgitation is not demonstrated. Aortic Valve: The aortic valve is tricuspid. Aortic valve regurgitation is not visualized. No aortic stenosis is present. Aortic valve mean gradient measures 3.0 mmHg. Aortic valve peak gradient measures 4.8 mmHg. Aortic valve area, by VTI measures 3.17 cm. Pulmonic Valve: The pulmonic valve  was normal in structure. Pulmonic valve regurgitation is not visualized. Aorta: The aortic root is normal in size and structure. Venous: IVC not visualized. The inferior vena cava was not well visualized. IAS/Shunts: No atrial level shunt detected by color flow Doppler.  LEFT VENTRICLE PLAX 2D LVIDd:         4.40 cm   Diastology LVIDs:         2.80 cm   LV e' medial:    8.81 cm/s LV PW:         1.20 cm   LV E/e' medial:  9.5 LV IVS:        1.00 cm   LV e' lateral:   13.80 cm/s LVOT diam:     2.10 cm   LV E/e' lateral: 6.1 LV SV:         72 LV SV Index:   30 LVOT Area:     3.46 cm  RIGHT VENTRICLE RV Basal diam:  4.80 cm RV Mid diam:    3.90 cm RV S prime:     9.68 cm/s TAPSE (M-mode): 2.9 cm LEFT ATRIUM             Index        RIGHT ATRIUM           Index LA diam:        3.70 cm 1.55 cm/m   RA Area:     17.50 cm LA Vol (A2C):   90.2 ml 37.87 ml/m  RA Volume:   51.10 ml  21.46 ml/m LA Vol (A4C):   50.5 ml 21.20 ml/m LA Biplane Vol: 70.6 ml 29.64 ml/m  AORTIC VALVE                    PULMONIC VALVE AV  Area (Vmax):    3.18 cm     PV Vmax:       1.22 m/s AV Area (Vmean):   2.80 cm     PV Peak grad:  6.0 mmHg AV Area (VTI):     3.17 cm AV Vmax:           110.00 cm/s AV Vmean:          80.900 cm/s AV VTI:            0.228 m AV Peak Grad:      4.8 mmHg AV Mean Grad:      3.0 mmHg LVOT Vmax:         101.00 cm/s LVOT Vmean:        65.400 cm/s LVOT VTI:          0.209 m LVOT/AV VTI ratio: 0.92  AORTA Ao Root diam: 3.20 cm MITRAL VALVE MV Area (PHT): 3.74 cm    SHUNTS MV Area VTI:   2.42 cm    Systemic VTI:  0.21 m MV Peak grad:  3.4 mmHg    Systemic Diam: 2.10 cm MV Mean grad:  2.0 mmHg MV Vmax:       0.92 m/s MV Vmean:      59.9 cm/s MV Decel Time: 203 msec MV E velocity: 83.60 cm/s MV A velocity: 58.30 cm/s MV E/A ratio:  1.43 Dalton McleanMD Electronically signed by Wilfred Lacyalton McleanMD Signature Date/Time: 12/22/2021/4:09:58 PM    Final    DG Abd Portable 1V  Result Date: 12/22/2021 CLINICAL DATA:  Sigmoid volvulus (HCC) EXAM: PORTABLE ABDOMEN - 1 VIEW COMPARISON:  CT abdomen/pelvis 12/21/2021. FINDINGS: Findings of sigmoid volvulus, better characterized on  recent CT abdomen/pelvis. The degree of gaseous distension of the sigmoid colon appears similar versus slightly progressed when comparing to prior scout. No obvious evidence of free air on these limited scout radiographs. No abnormal calcifications identified. Multilevel degenerative change of the lumbar spine. IMPRESSION: Findings of sigmoid volvulus, better characterized on recent CT abdomen/pelvis. The degree of gaseous distension of the sigmoid colon appears similar versus slightly progressed when comparing to prior scout. Electronically Signed   By: Feliberto Harts M.D.   On: 12/22/2021 08:26   CT Angio Chest PE W and/or Wo Contrast  Result Date: 12/21/2021 CLINICAL DATA:  Possible syncopal episode EXAM: CT ANGIOGRAPHY CHEST CT ABDOMEN AND PELVIS WITH CONTRAST TECHNIQUE: Multidetector CT imaging of the chest was performed using the standard protocol  during bolus administration of intravenous contrast. Multiplanar CT image reconstructions and MIPs were obtained to evaluate the vascular anatomy. Multidetector CT imaging of the abdomen and pelvis was performed using the standard protocol during bolus administration of intravenous contrast. RADIATION DOSE REDUCTION: This exam was performed according to the departmental dose-optimization program which includes automated exposure control, adjustment of the mA and/or kV according to patient size and/or use of iterative reconstruction technique. CONTRAST:  OMNIPAQUE IOHEXOL 350 MG/ML SOLN COMPARISON:  None Available. FINDINGS: CTA CHEST FINDINGS Cardiovascular: Satisfactory opacification of the pulmonary arteries to the segmental level. No evidence of pulmonary embolism. Normal caliber thoracic aorta without evidence of dissection. Normal heart size. No pericardial effusion. Mediastinum/Nodes: No enlarged mediastinal, hilar, or axillary lymph nodes. Thyroid gland, trachea, and esophagus demonstrate no significant findings. Lungs/Pleura: Lungs are clear. No pleural effusion or pneumothorax. Musculoskeletal: No chest wall abnormality. No acute or significant osseous findings. Review of the MIP images confirms the above findings. CT ABDOMEN and PELVIS FINDINGS Hepatobiliary: No focal liver abnormality is seen. No gallstones, gallbladder wall thickening, or biliary dilatation. Pancreas: Unremarkable. No pancreatic ductal dilatation or surrounding inflammatory changes. Spleen: Normal in size without focal abnormality. Adrenals/Urinary Tract: Bilateral adrenal glands appear normal. No hydronephrosis. Kidneys demonstrate symmetric enhancement. Urinary bladder is unremarkable for degree of distension. Stomach/Bowel: No radiopaque enteric contrast material was administered. Stomach is unremarkable for degree of distension. No pathologic dilation of small bowel. The appendix and terminal ileum are grossly unremarkable.  Gas-filled dilated sigmoid colon is rotated into the abdomen with abrupt narrowing of the proximal and distal portions on image 72/5 with fluid adjacent to this area of narrowing in the left lower quadrant on image 41/8. Additionally there is free fluid layering in the pelvis. Vascular/Lymphatic: Normal caliber abdominal aorta. No pathologically enlarged abdominal or pelvic lymph nodes. Reproductive: Prostate is unremarkable. Other: Free fluid in the pelvis and left pericolic gutter. No pneumoperitoneum. No portal venous gas. Musculoskeletal: L5-S1 discogenic disease. Review of the MIP images confirms the above findings. IMPRESSION: 1. Gas-filled dilated sigmoid colon is rotated into the abdomen with abrupt collapse of the proximal and distal portions as well as fluid adjacent to the area of narrowing in the left pericolic gutter and layering in the pelvis. Findings compatible with sigmoid volvulus. No pneumatosis, pneumoperitoneum or portal venous gas. 2. No evidence of pulmonary embolus or thoracic aortic aneurysm/dissection. Electronically Signed   By: Maudry Mayhew M.D.   On: 12/21/2021 17:53   CT ABDOMEN PELVIS W CONTRAST  Result Date: 12/21/2021 CLINICAL DATA:  Possible syncopal episode EXAM: CT ANGIOGRAPHY CHEST CT ABDOMEN AND PELVIS WITH CONTRAST TECHNIQUE: Multidetector CT imaging of the chest was performed using the standard protocol during bolus administration of intravenous contrast.  Multiplanar CT image reconstructions and MIPs were obtained to evaluate the vascular anatomy. Multidetector CT imaging of the abdomen and pelvis was performed using the standard protocol during bolus administration of intravenous contrast. RADIATION DOSE REDUCTION: This exam was performed according to the departmental dose-optimization program which includes automated exposure control, adjustment of the mA and/or kV according to patient size and/or use of iterative reconstruction technique. CONTRAST:  OMNIPAQUE  IOHEXOL 350 MG/ML SOLN COMPARISON:  None Available. FINDINGS: CTA CHEST FINDINGS Cardiovascular: Satisfactory opacification of the pulmonary arteries to the segmental level. No evidence of pulmonary embolism. Normal caliber thoracic aorta without evidence of dissection. Normal heart size. No pericardial effusion. Mediastinum/Nodes: No enlarged mediastinal, hilar, or axillary lymph nodes. Thyroid gland, trachea, and esophagus demonstrate no significant findings. Lungs/Pleura: Lungs are clear. No pleural effusion or pneumothorax. Musculoskeletal: No chest wall abnormality. No acute or significant osseous findings. Review of the MIP images confirms the above findings. CT ABDOMEN and PELVIS FINDINGS Hepatobiliary: No focal liver abnormality is seen. No gallstones, gallbladder wall thickening, or biliary dilatation. Pancreas: Unremarkable. No pancreatic ductal dilatation or surrounding inflammatory changes. Spleen: Normal in size without focal abnormality. Adrenals/Urinary Tract: Bilateral adrenal glands appear normal. No hydronephrosis. Kidneys demonstrate symmetric enhancement. Urinary bladder is unremarkable for degree of distension. Stomach/Bowel: No radiopaque enteric contrast material was administered. Stomach is unremarkable for degree of distension. No pathologic dilation of small bowel. The appendix and terminal ileum are grossly unremarkable. Gas-filled dilated sigmoid colon is rotated into the abdomen with abrupt narrowing of the proximal and distal portions on image 72/5 with fluid adjacent to this area of narrowing in the left lower quadrant on image 41/8. Additionally there is free fluid layering in the pelvis. Vascular/Lymphatic: Normal caliber abdominal aorta. No pathologically enlarged abdominal or pelvic lymph nodes. Reproductive: Prostate is unremarkable. Other: Free fluid in the pelvis and left pericolic gutter. No pneumoperitoneum. No portal venous gas. Musculoskeletal: L5-S1 discogenic disease.  Review of the MIP images confirms the above findings. IMPRESSION: 1. Gas-filled dilated sigmoid colon is rotated into the abdomen with abrupt collapse of the proximal and distal portions as well as fluid adjacent to the area of narrowing in the left pericolic gutter and layering in the pelvis. Findings compatible with sigmoid volvulus. No pneumatosis, pneumoperitoneum or portal venous gas. 2. No evidence of pulmonary embolus or thoracic aortic aneurysm/dissection. Electronically Signed   By: Maudry Mayhew M.D.   On: 12/21/2021 17:53      Dimond Crotty T. Jazalynn Mireles [facility]  If 7PM-7AM, please contact night-coverage [email or web address] 12/22/2021, 4:29 PM

## 2021-12-23 ENCOUNTER — Other Ambulatory Visit: Payer: Self-pay

## 2021-12-23 ENCOUNTER — Inpatient Hospital Stay (HOSPITAL_COMMUNITY): Payer: Self-pay | Admitting: Anesthesiology

## 2021-12-23 ENCOUNTER — Encounter (HOSPITAL_COMMUNITY): Payer: Self-pay | Admitting: Student

## 2021-12-23 ENCOUNTER — Encounter (HOSPITAL_COMMUNITY)
Admission: EM | Disposition: A | Discharge status: Home / Self Care | Payer: Self-pay | Source: Home / Self Care | Attending: Internal Medicine

## 2021-12-23 DIAGNOSIS — K562 Volvulus: Secondary | ICD-10-CM

## 2021-12-23 HISTORY — PX: COLON RESECTION SIGMOID: SHX6737

## 2021-12-23 LAB — CBC
HCT: 44.3 % (ref 39.0–52.0)
Hemoglobin: 14.7 g/dL (ref 13.0–17.0)
MCH: 28.5 pg (ref 26.0–34.0)
MCHC: 33.2 g/dL (ref 30.0–36.0)
MCV: 86 fL (ref 80.0–100.0)
Platelets: 239 10*3/uL (ref 150–400)
RBC: 5.15 MIL/uL (ref 4.22–5.81)
RDW: 13.6 % (ref 11.5–15.5)
WBC: 5.9 10*3/uL (ref 4.0–10.5)
nRBC: 0 % (ref 0.0–0.2)

## 2021-12-23 LAB — MAGNESIUM: Magnesium: 2 mg/dL (ref 1.7–2.4)

## 2021-12-23 LAB — COMPREHENSIVE METABOLIC PANEL
ALT: 34 U/L (ref 0–44)
AST: 30 U/L (ref 15–41)
Albumin: 3.4 g/dL — ABNORMAL LOW (ref 3.5–5.0)
Alkaline Phosphatase: 79 U/L (ref 38–126)
Anion gap: 8 (ref 5–15)
BUN: 11 mg/dL (ref 6–20)
CO2: 25 mmol/L (ref 22–32)
Calcium: 8.8 mg/dL — ABNORMAL LOW (ref 8.9–10.3)
Chloride: 105 mmol/L (ref 98–111)
Creatinine, Ser: 0.97 mg/dL (ref 0.61–1.24)
GFR, Estimated: >60 mL/min (ref >60)
Glucose, Bld: 89 mg/dL (ref 70–99)
Potassium: 4.2 mmol/L (ref 3.5–5.1)
Sodium: 138 mmol/L (ref 135–145)
Total Bilirubin: 1.7 mg/dL — ABNORMAL HIGH (ref 0.3–1.2)
Total Protein: 6.6 g/dL (ref 6.5–8.1)

## 2021-12-23 LAB — TSH: TSH: 2.242 u[IU]/mL (ref 0.350–4.500)

## 2021-12-23 LAB — GLUCOSE, CAPILLARY: Glucose-Capillary: 80 mg/dL (ref 70–99)

## 2021-12-23 SURGERY — COLECTOMY, SIGMOID, OPEN
Anesthesia: General

## 2021-12-23 MED ORDER — MEPERIDINE HCL 50 MG/ML IJ SOLN: 6.2500 mg | INTRAMUSCULAR | Status: DC | PRN

## 2021-12-23 MED ORDER — FENTANYL CITRATE (PF) 100 MCG/2ML IJ SOLN
INTRAMUSCULAR | Status: AC
  Filled 2021-12-23: qty 2

## 2021-12-23 MED ORDER — ONDANSETRON HCL 4 MG/2ML IJ SOLN
4.0000 mg | Freq: Four times a day (QID) | INTRAMUSCULAR | Status: DC | PRN
  Administered 2021-12-25 – 2021-12-27 (×3): 4 mg via INTRAVENOUS
  Filled 2021-12-23 (×3): qty 2

## 2021-12-23 MED ORDER — ONDANSETRON HCL 4 MG/2ML IJ SOLN
INTRAMUSCULAR | Status: DC | PRN
  Administered 2021-12-23: 4 mg via INTRAVENOUS

## 2021-12-23 MED ORDER — HYDROMORPHONE HCL 1 MG/ML IJ SOLN: 1.0000 mg | INTRAMUSCULAR | Status: DC | PRN

## 2021-12-23 MED ORDER — HYDROMORPHONE HCL 1 MG/ML IJ SOLN
0.2500 mg | INTRAMUSCULAR | Status: DC | PRN
  Administered 2021-12-23 (×2): 0.5 mg via INTRAVENOUS

## 2021-12-23 MED ORDER — OXYCODONE HCL 5 MG PO TABS
5.0000 mg | ORAL_TABLET | ORAL | Status: DC | PRN
  Administered 2021-12-23 – 2021-12-26 (×4): 5 mg via ORAL
  Filled 2021-12-23 (×4): qty 1

## 2021-12-23 MED ORDER — FENTANYL CITRATE (PF) 250 MCG/5ML IJ SOLN
INTRAMUSCULAR | Status: AC
  Filled 2021-12-23: qty 5

## 2021-12-23 MED ORDER — 0.9 % SODIUM CHLORIDE (POUR BTL) OPTIME
TOPICAL | Status: DC | PRN
  Administered 2021-12-23 (×2): 1000 mL

## 2021-12-23 MED ORDER — ROCURONIUM BROMIDE 10 MG/ML (PF) SYRINGE
PREFILLED_SYRINGE | INTRAVENOUS | Status: DC | PRN
  Administered 2021-12-23 (×2): 20 mg via INTRAVENOUS
  Administered 2021-12-23: 60 mg via INTRAVENOUS
  Administered 2021-12-23: 20 mg via INTRAVENOUS

## 2021-12-23 MED ORDER — SUGAMMADEX SODIUM 200 MG/2ML IV SOLN
INTRAVENOUS | Status: DC | PRN
  Administered 2021-12-23: 250 mg via INTRAVENOUS

## 2021-12-23 MED ORDER — PROCHLORPERAZINE EDISYLATE 10 MG/2ML IJ SOLN: 10.0000 mg | INTRAMUSCULAR | Status: DC | PRN

## 2021-12-23 MED ORDER — LIDOCAINE 2% (20 MG/ML) 5 ML SYRINGE
INTRAMUSCULAR | Status: DC | PRN
  Administered 2021-12-23: 60 mg via INTRAVENOUS

## 2021-12-23 MED ORDER — GABAPENTIN 300 MG PO CAPS
300.0000 mg | ORAL_CAPSULE | Freq: Three times a day (TID) | ORAL | Status: DC
  Administered 2021-12-23 – 2021-12-26 (×10): 300 mg via ORAL
  Filled 2021-12-23 (×10): qty 1

## 2021-12-23 MED ORDER — PROPOFOL 10 MG/ML IV BOLUS
INTRAVENOUS | Status: AC
  Filled 2021-12-23: qty 20

## 2021-12-23 MED ORDER — SODIUM CHLORIDE 0.9 % IV SOLN
2.0000 g | INTRAVENOUS | Status: AC
  Administered 2021-12-23: 2 g via INTRAVENOUS
  Filled 2021-12-23 (×2): qty 2

## 2021-12-23 MED ORDER — DOCUSATE SODIUM 100 MG PO CAPS
100.0000 mg | ORAL_CAPSULE | Freq: Two times a day (BID) | ORAL | Status: DC
  Administered 2021-12-23 – 2021-12-26 (×7): 100 mg via ORAL
  Filled 2021-12-23 (×7): qty 1

## 2021-12-23 MED ORDER — HYDROMORPHONE HCL 1 MG/ML IJ SOLN
INTRAMUSCULAR | Status: AC
  Filled 2021-12-23: qty 1

## 2021-12-23 MED ORDER — DEXAMETHASONE SODIUM PHOSPHATE 10 MG/ML IJ SOLN
INTRAMUSCULAR | Status: DC | PRN
  Administered 2021-12-23: 10 mg via INTRAVENOUS

## 2021-12-23 MED ORDER — PROMETHAZINE HCL 25 MG/ML IJ SOLN: 6.2500 mg | INTRAMUSCULAR | Status: DC | PRN

## 2021-12-23 MED ORDER — METHOCARBAMOL 500 MG IVPB - SIMPLE MED
500.0000 mg | Freq: Four times a day (QID) | INTRAVENOUS | Status: DC | PRN
  Administered 2021-12-23 – 2021-12-25 (×2): 500 mg via INTRAVENOUS
  Filled 2021-12-23: qty 500

## 2021-12-23 MED ORDER — MIDAZOLAM HCL 2 MG/2ML IJ SOLN
INTRAMUSCULAR | Status: AC
  Filled 2021-12-23: qty 2

## 2021-12-23 MED ORDER — ONDANSETRON HCL 4 MG/2ML IJ SOLN
INTRAMUSCULAR | Status: AC
  Filled 2021-12-23: qty 2

## 2021-12-23 MED ORDER — MIDAZOLAM HCL 5 MG/5ML IJ SOLN
INTRAMUSCULAR | Status: DC | PRN
  Administered 2021-12-23: 2 mg via INTRAVENOUS

## 2021-12-23 MED ORDER — LACTATED RINGERS IV SOLN: INTRAVENOUS | Status: DC

## 2021-12-23 MED ORDER — METHOCARBAMOL 500 MG IVPB - SIMPLE MED
INTRAVENOUS | Status: AC
  Filled 2021-12-23: qty 55

## 2021-12-23 MED ORDER — OXYCODONE HCL 5 MG PO TABS
10.0000 mg | ORAL_TABLET | ORAL | Status: DC | PRN
  Administered 2021-12-25 (×3): 10 mg via ORAL
  Filled 2021-12-23 (×3): qty 2

## 2021-12-23 MED ORDER — SIMETHICONE 80 MG PO CHEW
80.0000 mg | CHEWABLE_TABLET | Freq: Four times a day (QID) | ORAL | Status: DC | PRN
  Administered 2021-12-25 (×3): 80 mg via ORAL
  Filled 2021-12-23 (×3): qty 1

## 2021-12-23 MED ORDER — ACETAMINOPHEN 325 MG PO TABS
650.0000 mg | ORAL_TABLET | Freq: Four times a day (QID) | ORAL | Status: DC
  Administered 2021-12-23 – 2021-12-24 (×2): 650 mg via ORAL
  Filled 2021-12-23 (×2): qty 2

## 2021-12-23 MED ORDER — FENTANYL CITRATE (PF) 100 MCG/2ML IJ SOLN
INTRAMUSCULAR | Status: DC | PRN
  Administered 2021-12-23 (×2): 50 ug via INTRAVENOUS
  Administered 2021-12-23: 100 ug via INTRAVENOUS
  Administered 2021-12-23 (×3): 50 ug via INTRAVENOUS

## 2021-12-23 MED ORDER — CHLORHEXIDINE GLUCONATE 0.12 % MT SOLN
15.0000 mL | Freq: Once | OROMUCOSAL | Status: AC
  Administered 2021-12-23: 15 mL via OROMUCOSAL

## 2021-12-23 MED ORDER — DEXTROSE IN LACTATED RINGERS 5 % IV SOLN: INTRAVENOUS | Status: DC

## 2021-12-23 MED ORDER — PROPOFOL 10 MG/ML IV BOLUS
INTRAVENOUS | Status: DC | PRN
  Administered 2021-12-23: 200 mg via INTRAVENOUS
  Administered 2021-12-23: 100 mg via INTRAVENOUS

## 2021-12-23 SURGICAL SUPPLY — 38 items
BAG COUNTER SPONGE SURGICOUNT (BAG) IMPLANT
BLADE EXTENDED COATED 6.5IN (ELECTRODE) IMPLANT
CHLORAPREP W/TINT 26 (MISCELLANEOUS) ×1 IMPLANT
COVER SURGICAL LIGHT HANDLE (MISCELLANEOUS) ×1 IMPLANT
DERMABOND ADVANCED .7 DNX12 (GAUZE/BANDAGES/DRESSINGS) ×1 IMPLANT
DRAIN CHANNEL 19F RND (DRAIN) IMPLANT
DRAPE LAPAROSCOPIC ABDOMINAL (DRAPES) ×1 IMPLANT
DRSG OPSITE POSTOP 4X10 (GAUZE/BANDAGES/DRESSINGS) IMPLANT
DRSG OPSITE POSTOP 4X6 (GAUZE/BANDAGES/DRESSINGS) IMPLANT
DRSG OPSITE POSTOP 4X8 (GAUZE/BANDAGES/DRESSINGS) IMPLANT
ELECT REM PT RETURN 15FT ADLT (MISCELLANEOUS) ×1 IMPLANT
EVACUATOR SILICONE 100CC (DRAIN) IMPLANT
GAUZE SPONGE 4X4 12PLY STRL (GAUZE/BANDAGES/DRESSINGS) ×1 IMPLANT
GLOVE BIO SURGEON STRL SZ7.5 (GLOVE) ×2 IMPLANT
GLOVE INDICATOR 8.0 STRL GRN (GLOVE) ×2 IMPLANT
GOWN STRL REUS W/ TWL XL LVL3 (GOWN DISPOSABLE) ×4 IMPLANT
GOWN STRL REUS W/TWL XL LVL3 (GOWN DISPOSABLE) ×4
HANDLE SUCTION POOLE (INSTRUMENTS) IMPLANT
KIT TURNOVER KIT A (KITS) IMPLANT
LEGGING LITHOTOMY PAIR STRL (DRAPES) ×1 IMPLANT
PACK COLON (CUSTOM PROCEDURE TRAY) ×1 IMPLANT
PENCIL SMOKE EVACUATOR (MISCELLANEOUS) IMPLANT
STAPLER CVD CUT BL 40 RELOAD (ENDOMECHANICALS) ×1 IMPLANT
STAPLER CVD CUT BLU 40 RELOAD (ENDOMECHANICALS) IMPLANT
STAPLER ECHELON POWER CIR 31 (STAPLE) IMPLANT
STAPLER PROXIMATE 75MM BLUE (STAPLE) IMPLANT
STAPLER VISISTAT 35W (STAPLE) ×1 IMPLANT
SUCTION POOLE HANDLE (INSTRUMENTS)
SUT ETHILON 2 0 PS N (SUTURE) IMPLANT
SUT MNCRL AB 4-0 PS2 18 (SUTURE) IMPLANT
SUT PDS AB 1 TP1 96 (SUTURE) IMPLANT
SUT SILK 2 0 (SUTURE) ×1
SUT SILK 2 0 SH CR/8 (SUTURE) ×1 IMPLANT
SUT SILK 2-0 18XBRD TIE 12 (SUTURE) ×1 IMPLANT
SUT SILK 3 0 (SUTURE) ×1
SUT SILK 3 0 SH CR/8 (SUTURE) ×1 IMPLANT
SUT SILK 3-0 18XBRD TIE 12 (SUTURE) ×1 IMPLANT
TRAY FOLEY MTR SLVR 16FR STAT (SET/KITS/TRAYS/PACK) ×1 IMPLANT

## 2021-12-23 NOTE — Transfer of Care (Signed)
Immediate Anesthesia Transfer of Care Note  Patient: Daelyn R Etheridge  Procedure(s) Performed: SIGMOID COLECTOMY  Patient Location: PACU  Anesthesia Type:General  Level of Consciousness: sedated, patient cooperative, and responds to stimulation  Airway & Oxygen Therapy: Patient Spontanous Breathing and Patient connected to face mask oxygen  Post-op Assessment: Report given to RN and Post -op Vital signs reviewed and stable  Post vital signs: Reviewed and stable  Last Vitals:  Vitals Value Taken Time  BP 149/97 12/23/21 1802  Temp    Pulse 104 12/23/21 1804  Resp 31 12/23/21 1804  SpO2 94 % 12/23/21 1804  Vitals shown include unvalidated device data.  Last Pain:  Vitals:   12/23/21 1315  TempSrc:   PainSc: 0-No pain         Complications: No notable events documented.

## 2021-12-23 NOTE — Anesthesia Procedure Notes (Signed)
Procedure Name: Intubation Date/Time: 12/23/2021 2:44 PM  Performed by: Gean Maidens, CRNAPre-anesthesia Checklist: Patient identified, Emergency Drugs available, Suction available, Patient being monitored and Timeout performed Patient Re-evaluated:Patient Re-evaluated prior to induction Oxygen Delivery Method: Circle system utilized Preoxygenation: Pre-oxygenation with 100% oxygen Induction Type: IV induction Ventilation: Mask ventilation without difficulty Laryngoscope Size: Mac and 4 Grade View: Grade I Tube type: Oral Tube size: 7.5 mm Number of attempts: 1 Airway Equipment and Method: Stylet Placement Confirmation: ETT inserted through vocal cords under direct vision, positive ETCO2 and breath sounds checked- equal and bilateral Secured at: 23 cm Tube secured with: Tape Dental Injury: Teeth and Oropharynx as per pre-operative assessment

## 2021-12-23 NOTE — Progress Notes (Signed)
  Transition of Care (TOC) Screening Note   Patient Details  Name: Nathaniel Hanson Date of Birth: [birth year]   Transition of Care [facility]) CM/SW Contact:    Otelia Santee, LCSW Phone Number: 12/23/2021, 8:52 AM    Transition of Care Department [facility]) has reviewed patient and no TOC needs have been identified at this time. We will continue to monitor patient advancement through interdisciplinary progression rounds. If new patient transition needs arise, please place a TOC consult.

## 2021-12-23 NOTE — Progress Notes (Signed)
PROGRESS NOTE  Nathaniel Hanson ZHG:[id number] DOB: 1980-08-06   PCP: Creola Corn, MD  Patient is from: Home.  DOA: 12/21/2021 LOS: 1  Chief complaints Chief Complaint  Patient presents with   Syncopal Episode     Brief Narrative / Interim history: 41 year old M with no significant past medical history presenting with what looks like a syncopal episode and found to have sigmoid volvulus in ED.  General surgery and GI consulted.  Patient underwent flex sigmoidoscopy on 12/6 but abdominal x-ray with persistent massive dilation of sigmoid colon.  General surgery to take patient for sigmoid colectomy on 12/7.  Syncope work-up including serial troponin, EKG, telemetry and echocardiogram unrevealing.  Syncope felt to be vasovagal in the setting of the above..   Subjective: Seen and examined earlier this morning.  No major events overnight of this morning.  No complaints.  No flatus or bowel movement yet.  No abdominal pain either.  No nausea or vomiting.  Objective: Vitals:   12/22/21 1740 12/22/21 2241 12/23/21 0244 12/23/21 0500  BP: (!) 129/92 (!) 139/96 (!) 127/92   Pulse: 74 77 67   Resp: 17 18 18    Temp: 97.8 F (36.6 C) 97.7 F (36.5 C) 97.8 F (36.6 C)   TempSrc: Oral Oral Oral   SpO2: 96% 99% 100%   Weight:    116.2 kg  Height:        Examination:  GENERAL: No apparent distress.  Nontoxic. HEENT: MMM.  Vision and hearing grossly intact.  NECK: Supple.  No apparent JVD.  RESP:  No IWOB.  Fair aeration bilaterally. CVS:  RRR. Heart sounds normal.  ABD/GI/GU: BS+. Abd soft, NTND.  MSK/EXT:  Moves extremities. No apparent deformity. No edema.  SKIN: no apparent skin lesion or wound NEURO: Awake and alert. Oriented appropriately.  No apparent focal neuro deficit. PSYCH: Calm. Normal affect.   Procedures:  12/6-Flex sigmoidoscopy  Microbiology summarized: COVID-19 and influenza PCR nonreactive  Assessment and plan: Principal Problem:   Syncope Active  Problems:   Sigmoid volvulus (HCC)  Sigmoid volvulus: Incidental finding. S/p underwent flex sigmoidoscopy on 12/6 but abdominal x-ray with persistent massive dilation of sigmoid colon.  No flatus or bowel movements. -General surgery to take patient for sigmoid colectomy. -N.p.o. D5-LR at 100 cc an hour  Vasovagal syncope: Work-up including serial troponin, EKG, telemetry and echocardiogram reassuring.  No significant cardiopulmonary finding on CT chest.  No significant personal or family history of cardiac disease.  No exertional dyspnea, syncope or chest pain.  Could be vasovagal from sigmoid volvulus.  -Continue telemetry monitoring  Nocturnal cough: Could be due to allergy or/and/GERD.  CT chest, abdomen and pelvis without acute finding other than sigmoid volvulus. -Would like to hold off medications for now -Encouraged lifestyle change  Obesity Body mass index is 33.8 kg/m. -Encouraged lifestyle change.          DVT prophylaxis:  SCDs Start: 12/22/21 0028  Code Status: Full code Family Communication: None at bedside Level of care: Telemetry Status is: Inpatient The patient will remain inpatient because: Sigmoid volvulus   Final disposition: Home Consultants:  General surgery Gastroenterology  Sch Meds:  Scheduled Meds:  sodium chloride flush  3 mL Intravenous Q12H   Continuous Infusions:  cefoTEtan (CEFOTAN) IV     PRN Meds:.  Antimicrobials: Anti-infectives (From admission, onward)    Start     Dose/Rate Route Frequency Ordered Stop   12/23/21 1400  cefoTEtan (CEFOTAN) 2 g in sodium chloride 0.9 % 100  mL IVPB        2 g 200 mL/hr over 30 Minutes Intravenous 60 min pre-op 12/23/21 1040          I have personally reviewed the following labs and images: CBC: Recent Labs  Lab 12/21/21 0029 12/23/21 0610  WBC 9.2 5.9  HGB 14.7 14.7  HCT 43.9 44.3  MCV 83.8 86.0  PLT 252 239   BMP &GFR Recent Labs  Lab 12/21/21 0029 12/23/21 0610  NA 138 138   K 3.7 4.2  CL 109 105  CO2 22 25  GLUCOSE 96 89  BUN 15 11  CREATININE 0.97 0.97  CALCIUM 9.0 8.8*  MG  --  2.0   Estimated Creatinine Clearance: 135.2 mL/min (by C-G formula based on SCr of 0.97 mg/dL). Liver & Pancreas: Recent Labs  Lab 12/23/21 0610  AST 30  ALT 34  ALKPHOS 79  BILITOT 1.7*  PROT 6.6  ALBUMIN 3.4*   No results for input(s): "LIPASE", "AMYLASE" in the last 168 hours. No results for input(s): "AMMONIA" in the last 168 hours. Diabetic: No results for input(s): "HGBA1C" in the last 72 hours. Recent Labs  Lab 12/22/21 0453 12/23/21 0717  GLUCAP 82 80   Cardiac Enzymes: No results for input(s): "CKTOTAL", "CKMB", "CKMBINDEX", "TROPONINI" in the last 168 hours. No results for input(s): "PROBNP" in the last 8760 hours. Coagulation Profile: No results for input(s): "INR", "PROTIME" in the last 168 hours. Thyroid Function Tests: Recent Labs    12/23/21 0610  TSH 2.242   Lipid Profile: No results for input(s): "CHOL", "HDL", "LDLCALC", "TRIG", "CHOLHDL", "LDLDIRECT" in the last 72 hours. Anemia Panel: No results for input(s): "VITAMINB12", "FOLATE", "FERRITIN", "TIBC", "IRON", "RETICCTPCT" in the last 72 hours. Urine analysis:    Component Value Date/Time   COLORURINE YELLOW 12/21/2021 0631   APPEARANCEUR CLEAR 12/21/2021 0631   LABSPEC >1.046 (H) 12/21/2021 0631   PHURINE 5.0 12/21/2021 0631   GLUCOSEU NEGATIVE 12/21/2021 0631   HGBUR NEGATIVE 12/21/2021 0631   BILIRUBINUR NEGATIVE 12/21/2021 0631   KETONESUR NEGATIVE 12/21/2021 0631   PROTEINUR NEGATIVE 12/21/2021 0631   NITRITE NEGATIVE 12/21/2021 0631   LEUKOCYTESUR NEGATIVE 12/21/2021 0631   Sepsis Labs: Invalid input(s): "PROCALCITONIN", "LACTICIDVEN"  Microbiology: Recent Results (from the past 240 hour(s))  Resp Panel by RT-PCR (Flu A&B, Covid) Anterior Nasal Swab     Status: None   Collection Time: 12/21/21 12:29 AM   Specimen: Anterior Nasal Swab  Result Value Ref Range Status    SARS Coronavirus 2 by RT PCR NEGATIVE NEGATIVE Final    Comment: (NOTE) SARS-CoV-2 target nucleic acids are NOT DETECTED.  The SARS-CoV-2 RNA is generally detectable in upper respiratory specimens during the acute phase of infection. The lowest concentration of SARS-CoV-2 viral copies this assay can detect is 138 copies/mL. A negative result does not preclude SARS-Cov-2 infection and should not be used as the sole basis for treatment or other patient management decisions. A negative result may occur with  improper specimen collection/handling, submission of specimen other than nasopharyngeal swab, presence of viral mutation(s) within the areas targeted by this assay, and inadequate number of viral copies(<138 copies/mL). A negative result must be combined with clinical observations, patient history, and epidemiological information. The expected result is Negative.  Fact Sheet for Patients:  BloggerCourse.com  Fact Sheet for Healthcare Providers:  SeriousBroker.it  This test is no t yet approved or cleared by the [place] FDA and  has been authorized for detection and/or diagnosis of SARS-CoV-2  by FDA under an Emergency Use Authorization (EUA). This EUA will remain  in effect (meaning this test can be used) for the duration of the COVID-19 declaration under Section 564(b)(1) of the Act, 21 U.S.C.section 360bbb-3(b)(1), unless the authorization is terminated  or revoked sooner.       Influenza A by PCR NEGATIVE NEGATIVE Final   Influenza B by PCR NEGATIVE NEGATIVE Final    Comment: (NOTE) The Xpert Xpress SARS-CoV-2/FLU/RSV plus assay is intended as an aid in the diagnosis of influenza from Nasopharyngeal swab specimens and should not be used as a sole basis for treatment. Nasal washings and aspirates are unacceptable for Xpert Xpress SARS-CoV-2/FLU/RSV testing.  Fact Sheet for  Patients: BloggerCourse.comhttps://www.fda.gov/media/152166/download  Fact Sheet for Healthcare Providers: SeriousBroker.ithttps://www.fda.gov/media/152162/download  This test is not yet approved or cleared by the [place] FDA and has been authorized for detection and/or diagnosis of SARS-CoV-2 by FDA under an Emergency Use Authorization (EUA). This EUA will remain in effect (meaning this test can be used) for the duration of the COVID-19 declaration under Section 564(b)(1) of the Act, 21 U.S.C. section 360bbb-3(b)(1), unless the authorization is terminated or revoked.  Performed at [facility], [address] [place], [place] [address]     Radiology Studies: ECHOCARDIOGRAM COMPLETE  Result Date: 12/22/2021    ECHOCARDIOGRAM REPORT   Patient Name:   Nathaniel Hanson Date of Exam: 12/22/2021 Medical Rec #:  [id number]      Height:       73.0 in Accession #:    [id number]     Weight:       254.0 lb Date of Birth:  Sep 11, 1980      BSA:          2.382 m Patient Age:    41 years       BP:           124/71 mmHg Patient Gender: M              HR:           75 bpm. Exam Location:  Inpatient Procedure: 2D Echo, Cardiac Doppler and Color Doppler Indications:    Syncope  History:        Patient has no prior history of Echocardiogram examinations.                 Signs/Symptoms:Syncope.  Sonographer:    Mikki Harbororothy Buchanan Referring Phys: [id number] CHING T TU IMPRESSIONS  1. Left ventricular ejection fraction, by estimation, is 55%. The left ventricle has normal function. The left ventricle has no regional wall motion abnormalities. Left ventricular diastolic parameters were normal.  2. Right ventricular systolic function is normal. The right ventricular size is normal. Tricuspid regurgitation signal is inadequate for assessing PA pressure.  3. The mitral valve is normal in structure. Trivial mitral valve regurgitation. No evidence of mitral stenosis.  4. The aortic valve is tricuspid. Aortic valve regurgitation is not  visualized. No aortic stenosis is present.  5. IVC not visualized. FINDINGS  Left Ventricle: Left ventricular ejection fraction, by estimation, is 55%. The left ventricle has normal function. The left ventricle has no regional wall motion abnormalities. The left ventricular internal cavity size was normal in size. There is no left ventricular hypertrophy. Left ventricular diastolic parameters were normal. Right Ventricle: The right ventricular size is normal. No increase in right ventricular wall thickness. Right ventricular systolic function is normal. Tricuspid regurgitation signal is inadequate for assessing PA pressure. Left Atrium: Left atrial  size was normal in size. Right Atrium: Right atrial size was normal in size. Pericardium: There is no evidence of pericardial effusion. Mitral Valve: The mitral valve is normal in structure. Trivial mitral valve regurgitation. No evidence of mitral valve stenosis. MV peak gradient, 3.4 mmHg. The mean mitral valve gradient is 2.0 mmHg. Tricuspid Valve: The tricuspid valve is normal in structure. Tricuspid valve regurgitation is not demonstrated. Aortic Valve: The aortic valve is tricuspid. Aortic valve regurgitation is not visualized. No aortic stenosis is present. Aortic valve mean gradient measures 3.0 mmHg. Aortic valve peak gradient measures 4.8 mmHg. Aortic valve area, by VTI measures 3.17 cm. Pulmonic Valve: The pulmonic valve was normal in structure. Pulmonic valve regurgitation is not visualized. Aorta: The aortic root is normal in size and structure. Venous: IVC not visualized. The inferior vena cava was not well visualized. IAS/Shunts: No atrial level shunt detected by color flow Doppler.  LEFT VENTRICLE PLAX 2D LVIDd:         4.40 cm   Diastology LVIDs:         2.80 cm   LV e' medial:    8.81 cm/s LV PW:         1.20 cm   LV E/e' medial:  9.5 LV IVS:        1.00 cm   LV e' lateral:   13.80 cm/s LVOT diam:     2.10 cm   LV E/e' lateral: 6.1 LV SV:         72 LV  SV Index:   30 LVOT Area:     3.46 cm  RIGHT VENTRICLE RV Basal diam:  4.80 cm RV Mid diam:    3.90 cm RV S prime:     9.68 cm/s TAPSE (M-mode): 2.9 cm LEFT ATRIUM             Index        RIGHT ATRIUM           Index LA diam:        3.70 cm 1.55 cm/m   RA Area:     17.50 cm LA Vol (A2C):   90.2 ml 37.87 ml/m  RA Volume:   51.10 ml  21.46 ml/m LA Vol (A4C):   50.5 ml 21.20 ml/m LA Biplane Vol: 70.6 ml 29.64 ml/m  AORTIC VALVE                    PULMONIC VALVE AV Area (Vmax):    3.18 cm     PV Vmax:       1.22 m/s AV Area (Vmean):   2.80 cm     PV Peak grad:  6.0 mmHg AV Area (VTI):     3.17 cm AV Vmax:           110.00 cm/s AV Vmean:          80.900 cm/s AV VTI:            0.228 m AV Peak Grad:      4.8 mmHg AV Mean Grad:      3.0 mmHg LVOT Vmax:         101.00 cm/s LVOT Vmean:        65.400 cm/s LVOT VTI:          0.209 m LVOT/AV VTI ratio: 0.92  AORTA Ao Root diam: 3.20 cm MITRAL VALVE MV Area (PHT): 3.74 cm    SHUNTS MV Area VTI:   2.42 cm  Systemic VTI:  0.21 m MV Peak grad:  3.4 mmHg    Systemic Diam: 2.10 cm MV Mean grad:  2.0 mmHg MV Vmax:       0.92 m/s MV Vmean:      59.9 cm/s MV Decel Time: 203 msec MV E velocity: 83.60 cm/s MV A velocity: 58.30 cm/s MV E/A ratio:  1.43 Dalton McleanMD Electronically signed by Wilfred Lacy Signature Date/Time: 12/22/2021/4:09:58 PM    Final       Renad Jenniges T. Kimie Pidcock [facility]  If 7PM-7AM, please contact night-coverage [email or web address] 12/23/2021, 12:48 PM

## 2021-12-23 NOTE — Anesthesia Preprocedure Evaluation (Addendum)
Anesthesia Evaluation  Patient identified by MRN, date of birth, ID band Patient awake    Reviewed: Allergy & Precautions, NPO status , Patient's Chart, lab work & pertinent test results  History of Anesthesia Complications Negative for: history of anesthetic complications  Airway Mallampati: II  TM Distance: >3 FB Neck ROM: Full    Dental  (+) Dental Advisory Given, Teeth Intact   Pulmonary neg pulmonary ROS   Pulmonary exam normal        Cardiovascular negative cardio ROS Normal cardiovascular exam  Echo 12/22/2021  1. Left ventricular ejection fraction, by estimation, is 55%. The left ventricle has normal function. The left ventricle has no regional wall motion abnormalities. Left ventricular diastolic parameters were normal.   2. Right ventricular systolic function is normal. The right ventricular size is normal. Tricuspid regurgitation signal is inadequate for assessing PA pressure.   3. The mitral valve is normal in structure. Trivial mitral valve regurgitation. No evidence of mitral stenosis.   4. The aortic valve is tricuspid. Aortic valve regurgitation is not visualized. No aortic stenosis is present.   5. IVC not visualized.     Neuro/Psych negative neurological ROS  negative psych ROS   GI/Hepatic negative GI ROS, Neg liver ROS,,,  Endo/Other  negative endocrine ROS    Renal/GU negative Renal ROS     Musculoskeletal negative musculoskeletal ROS (+)    Abdominal   Peds  Hematology negative hematology ROS (+)   Anesthesia Other Findings Unexplained syncopal episode, ?vasovagal vs. seizure (no witnessed seizure activity)  Reproductive/Obstetrics                             Anesthesia Physical Anesthesia Plan  ASA: 1  Anesthesia Plan: General   Post-op Pain Management: Ketamine IV*, Ofirmev IV (intra-op)*, Lidocaine infusion* and Precedex   Induction: Intravenous, Rapid  sequence and Cricoid pressure planned  PONV Risk Score and Plan: 4 or greater and Treatment may vary due to age or medical condition, Ondansetron, Dexamethasone and Midazolam  Airway Management Planned: Oral ETT  Additional Equipment:   Intra-op Plan:   Post-operative Plan: Extubation in OR  Informed Consent:   Plan Discussed with:   Anesthesia Plan Comments:        Anesthesia Quick Evaluation

## 2021-12-23 NOTE — Anesthesia Postprocedure Evaluation (Signed)
Anesthesia Post Note  Patient: Nathaniel Hanson  Procedure(s) Performed: SIGMOID COLECTOMY     Patient location during evaluation: PACU Anesthesia Type: General Level of consciousness: sedated and patient cooperative Pain management: pain level controlled Vital Signs Assessment: post-procedure vital signs reviewed and stable Respiratory status: spontaneous breathing Cardiovascular status: stable Anesthetic complications: no   No notable events documented.  Last Vitals:  Vitals:   12/23/21 2000 12/23/21 2019  BP: 131/81 115/80  Pulse: (!) 107 (!) 103  Resp:  16  Temp:  37.4 C  SpO2: 95% 94%    Last Pain:  Vitals:   12/23/21 2019  TempSrc: Oral  PainSc:                  Lewie Loron

## 2021-12-23 NOTE — Progress Notes (Signed)
[facility] Progress Note  Nathaniel Hanson 41 y.o. [birth year]   Subjective: Denies abdominal pain. Feels good. His coach is in the room.  Objective: Vital signs: Vitals:   12/22/21 2241 12/23/21 0244  BP: (!) 139/96 (!) 127/92  Pulse: 77 67  Resp: 18 18  Temp: 97.7 F (36.5 C) 97.8 F (36.6 C)  SpO2: 99% 100%    Physical Exam: Gen: alert, no acute distress, well-nourished HEENT: anicteric sclera CV: RRR Chest: CTA B Abd: soft, nontender, nondistended, +BS Ext: no edema  Lab Results: Recent Labs    12/21/21 0029 12/23/21 0610  NA 138 138  K 3.7 4.2  CL 109 105  CO2 22 25  GLUCOSE 96 89  BUN 15 11  CREATININE 0.97 0.97  CALCIUM 9.0 8.8*  MG  --  2.0   Recent Labs    12/23/21 0610  AST 30  ALT 34  ALKPHOS 79  BILITOT 1.7*  PROT 6.6  ALBUMIN 3.4*   Recent Labs    12/21/21 0029 12/23/21 0610  WBC 9.2 5.9  HGB 14.7 14.7  HCT 43.9 44.3  MCV 83.8 86.0  PLT 252 239      Assessment/Plan: Sigmoid volvulus - s/p colonic decompression yesterday with minimal success. No BM since Monday. Xray yesterday was pre-procedure. Surgery planned today.   Nathaniel Hanson 12/23/2021, 12:49 PM  Questions please call [phone] ID: Nathaniel Hanson, male   DOB: 12-02-1980, 41 y.o.   MRN: [id number]

## 2021-12-23 NOTE — Op Note (Signed)
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   Surgical Procedure: SIGMOID COLECTOMY:  Mobilization of the splenic flexure   Operative Team Members:  Surgeon(s) and Role:    * Vontrell Pullman, Hyman Hopes, MD - Primary   Paulita Cradle, MD - [facility] Resident Assistant  Anesthesiologist: Lewie Loron, MD CRNA: Kizzie Fantasia, CRNA; Florene Route, CRNA   Anesthesia: General   Fluids:  Total I/O In: 2600 [I.V.:2500; IV Piggyback:100] Out: 600 [Urine:500; Blood:100]  Complications: None  Drains:  Penrose drain in the subcutaneous space    Specimen:  ID Type Source Tests Collected by Time Destination  1 : sigmoid colon GI Sigmoid Colon SURGICAL PATHOLOGY Shalisa Mcquade, Hyman Hopes, MD 12/23/2021 1548      Disposition:  PACU - hemodynamically stable.  Plan of Care: Admit to inpatient     Indications for Procedure: Nathaniel Hanson is a 41 y.o. male who presented with sigmoid volvulus.  I recommended sigmoid colectomy.  The procedure itself as well as its risks, benefits and alternatives were discussed.  The risks discussed included but were not limited to the risk of infection, bleeding, damage to nearby structures, and need for additional surgery or procedures.  After a full discussion and all questions answered the patient granted consent to proceed.  Findings: Adhesive bands connecting the sigmoid and descending colon to the base of the mesentery with massive dilation and twisting of sigmoid colon   Description of Procedure:   On the date stated above the patient was taken the operating room suite, general anesthesia induced, lithotomy position, abdomen prepped and draped, and a timeout completed verifying the correct patient, procedure, position, and equipment needed for the case.   I made a midline laparotomy incision and entered  the abdomen without any trauma to the underlying viscera.  I encountered a very dilated sigmoid colon.  We directed our attention to the colon resection.  The Bookwalter retractor was brought onto the field to expose the rectosigmoid junction and the patient was placed in headdown position.  I stayed close to the colon and selected a location below the dilated sigmoid near the rectosigmoid junction and used a contour stapler to divide the colon.  I divided the mesocolon very close to the colon worked proximally.  At first it appeared to be atypical volvulus, however I encountered some dense adhesive bands between the distal descending colon and the root of the mesentery.  These were difficult to divide and was careful not to injure the mesocolon.  I mobilized the left colon out of the retroperitoneum by dividing the white line of Toldt.  I was able to fully mobilized the distal descending colon and break up these adhesions and I divided the descending colon using a GIA 75 stapler, continue to divide the mesocolon with a LigaSure the specimen was then passed off the field.  We evaluated the tension between the descending colon and the stapled off rectosigmoid.  It seems like it may reach but there may be some tension so we decided to perform mobilization of the splenic flexure to minimize tension on the anastomosis.  The patient was placed in reverse Trendelenburg position.  I worked to elevate the omentum off the trick distal transverse colon, I worked to divide the attachments between the omentum and the diaphragm and abdominal wall I worked to mobilize the descending colon out of the retroperitoneum by  continuing my dissection up the white line of Toldt toward the splenic flexure.  This dissection was difficult as the splenic flexure sat very high in the abdomen and the patient had multiple additional dense adhesions and a significant amount of visceral fatty connective tissue.  I finally was able to mobilize the  splenic flexure and there was then minimal tension on the anastomosis.  The descending colon staple line was resected using curved Mayo's and sizers were brought onto the field.  The 31 mm sizer was selected.  An automatic pursestring device was used to pursestring the descending colon with a 0 Prolene suture.  This was tied down around the 31 mm stapler anvil.  I then went below and brought the sizers up the rectum to the rectosigmoid staple line.  They passed easily.  The stapler was inserted and the spike deployed just posterior to the staple line.  The anvil was connected to the spike and the stapler was fired.  There were full donuts and the stapler and the anastomosis was submerged under saline and a rigid proctoscope was inserted into the rectum to inflate the rectum and test the staple line.  There were no bubbles from the anastomosis consistent with a negative leak test.  We irrigated the abdomen suction to clean and then converted to a clean closure tray.  We scrubbed out , changed into new gowns and gloves and closed the abdomen.  The fascia was closed using running 2-0 PDS suture.  A Penrose drain was placed in the subcutaneous space and 2-0 Vicryl suture was used to close the deep dermal layer.  Staples were used to close the skin.  All sponge needle counts were correct at the end of this case.  A urinary straight cath was performed at the end of the case with 500 mL urine returned.   At the end of the case we reviewed the infection status of the case. Patient: Nathaniel Hanson Emergency General Surgery Service Patient Case: Urgent Infection Present At Time Of Surgery (PATOS):  Some contamination related to creating a colonic anastomosis.  Ivar Drape, MD General, Bariatric, & Minimally Invasive Surgery [facility], [place]

## 2021-12-23 NOTE — Progress Notes (Addendum)
Progress Note: General Surgery Service   Chief Complaint/Subjective: Still no significant abdominal pain, no bowel movements since Monday.  Objective: Vital signs in last 24 hours: Temp:  [97.6 F (36.4 C)-98.5 F (36.9 C)] 97.8 F (36.6 C) (12/07 0244) Pulse Rate:  [67-89] 67 (12/07 0244) Resp:  [16-27] 18 (12/07 0244) BP: (93-139)/(41-96) 127/92 (12/07 0244) SpO2:  [93 %-100 %] 100 % (12/07 0244) Weight:  [115.2 kg-116.2 kg] 116.2 kg (12/07 0500) Last BM Date : 12/20/21  Intake/Output from previous day: 12/06 0701 - 12/07 0700 In: 1567.5 [I.V.:1567.5] Out: -  Intake/Output this shift: No intake/output data recorded.  GI: Abd soft, mild distention.   Lab Results: CBC  Recent Labs    12/21/21 0029 12/23/21 0610  WBC 9.2 5.9  HGB 14.7 14.7  HCT 43.9 44.3  PLT 252 239   BMET Recent Labs    12/21/21 0029 12/23/21 0610  NA 138 138  K 3.7 4.2  CL 109 105  CO2 22 25  GLUCOSE 96 89  BUN 15 11  CREATININE 0.97 0.97  CALCIUM 9.0 8.8*   PT/INR No results for input(s): "LABPROT", "INR" in the last 72 hours. ABG No results for input(s): "PHART", "HCO3" in the last 72 hours.  Invalid input(s): "PCO2", "PO2"  Anti-infectives: Anti-infectives (From admission, onward)    None       Medications: Scheduled Meds:  sodium chloride flush  3 mL Intravenous Q12H   Continuous Infusions: PRN Meds:.  Assessment/Plan: Nathaniel Hanson is a 41 year old gentleman with sigmoid volvulus.  He underwent colonoscopic attempt at decompression, however still has massive dilation of his sigmoid colon on abdominal x-ray.  He has minimal abdominal symptoms, but I'm worried about the distention of the colon on imaging and recurrence with progression to perforation.  Certainly intermittent volvulus could contribute to dehydration and the vasovagal episodes he has had.  I recommend proceeding with open sigmoid colectomy today.  We discussed the procedure, its risks, benefits and alternatives  and the patient granted consent to proceed.  We will proceed as soon as time is available.    LOS: 1 day    Nathaniel Ore, MD  [facility] Use [email or web address] to contact on call provider  Daily Billing: [id number] - High MDM

## 2021-12-24 DIAGNOSIS — R000 Tachycardia, unspecified: Secondary | ICD-10-CM

## 2021-12-24 DIAGNOSIS — E871 Hypo-osmolality and hyponatremia: Secondary | ICD-10-CM

## 2021-12-24 LAB — CBC
HCT: 47.8 % (ref 39.0–52.0)
Hemoglobin: 15.9 g/dL (ref 13.0–17.0)
MCH: 27.8 pg (ref 26.0–34.0)
MCHC: 33.3 g/dL (ref 30.0–36.0)
MCV: 83.7 fL (ref 80.0–100.0)
Platelets: 265 10*3/uL (ref 150–400)
RBC: 5.71 MIL/uL (ref 4.22–5.81)
RDW: 13.5 % (ref 11.5–15.5)
WBC: 10.2 10*3/uL (ref 4.0–10.5)
nRBC: 0 % (ref 0.0–0.2)

## 2021-12-24 LAB — BASIC METABOLIC PANEL
Anion gap: 10 (ref 5–15)
BUN: 10 mg/dL (ref 6–20)
CO2: 23 mmol/L (ref 22–32)
Calcium: 8.5 mg/dL — ABNORMAL LOW (ref 8.9–10.3)
Chloride: 99 mmol/L (ref 98–111)
Creatinine, Ser: 1.14 mg/dL (ref 0.61–1.24)
GFR, Estimated: >60 mL/min (ref >60)
Glucose, Bld: 113 mg/dL — ABNORMAL HIGH (ref 70–99)
Potassium: 4.5 mmol/L (ref 3.5–5.1)
Sodium: 132 mmol/L — ABNORMAL LOW (ref 135–145)

## 2021-12-24 LAB — GLUCOSE, CAPILLARY: Glucose-Capillary: 120 mg/dL — ABNORMAL HIGH (ref 70–99)

## 2021-12-24 MED ORDER — ACETAMINOPHEN 500 MG PO TABS
1000.0000 mg | ORAL_TABLET | Freq: Four times a day (QID) | ORAL | Status: DC
  Administered 2021-12-24 – 2021-12-27 (×10): 1000 mg via ORAL
  Filled 2021-12-24 (×11): qty 2

## 2021-12-24 MED ORDER — SODIUM CHLORIDE 0.9 % IV SOLN: INTRAVENOUS | Status: DC

## 2021-12-24 MED ORDER — ENOXAPARIN SODIUM 40 MG/0.4ML IJ SOSY
40.0000 mg | PREFILLED_SYRINGE | INTRAMUSCULAR | Status: DC
  Administered 2021-12-24 – 2022-01-21 (×29): 40 mg via SUBCUTANEOUS
  Filled 2021-12-24 (×30): qty 0.4

## 2021-12-24 NOTE — Progress Notes (Signed)
Patient ID: Nathaniel Hanson, male   DOB: 11/04/1980, 41 y.o.   MRN: [id number]  Surgery findings noted. [facility] will sign off. Call us back if needed. Dr. Lorenso Quarry on call this weekend.

## 2021-12-24 NOTE — Progress Notes (Signed)
Mobility Specialist - Progress Note   12/24/21 1328  Mobility  Activity Ambulated with assistance in hallway  Level of Assistance Modified independent, requires aide device or extra time  Assistive Device Front wheel walker  Distance Ambulated (ft) 200 ft  Activity Response Tolerated well  Mobility Referral Yes  $Mobility charge 1 Mobility   Pt received in bed and agreed to mobility with some pain in incision site during session. Pt returned to chair with all needs met.   Roderick Pee Mobility Specialist

## 2021-12-24 NOTE — Progress Notes (Signed)
Rounding completed Q 2 hours, Pt assisted with urinal and assisted with ambulation to the bathroom. Pt is sleeping comfortably at this time. Bed is in the lowest position, bed alarm activated, and personal items within reach.

## 2021-12-24 NOTE — Progress Notes (Signed)
PROGRESS NOTE  Nathaniel Hanson XNA:[id number] DOB: 1980-05-03   PCP: Creola Corn, MD  Patient is from: Home.  DOA: 12/21/2021 LOS: 2  Chief complaints Chief Complaint  Patient presents with   Syncopal Episode     Brief Narrative / Interim history: 41 year old M with no significant past medical history presenting with what looks like a syncopal episode and found to have sigmoid volvulus in ED.  General surgery and GI consulted.  Patient underwent flex sigmoidoscopy on 12/6 but abdominal x-ray with persistent massive dilation of sigmoid colon.  Patient underwent sigmoid colectomy and mobilization of splenic flexure on 12/7.  Syncope work-up including serial troponin, EKG, telemetry and echocardiogram unrevealing.  Syncope felt to be vasovagal in the setting of the above..   Subjective: Seen and examined earlier this morning.  No major events overnight of this morning.  Reports significant surgical site pain.  Slightly tachycardic.  No flatus or bowel movement yet.  No other complaints.  Objective: Vitals:   12/23/21 2000 12/23/21 2019 12/24/21 0003 12/24/21 0510  BP: 131/81 115/80 128/76 116/79  Pulse: (!) 107 (!) 103 (!) 108 96  Resp:  16 18 19   Temp:  99.4 F (37.4 C) 97.9 F (36.6 C) 98.4 F (36.9 C)  TempSrc:  Oral Oral Oral  SpO2: 95% 94% 95% 97%  Weight:      Height:        Examination:   GENERAL: No apparent distress.  Nontoxic. HEENT: MMM.  Vision and hearing grossly intact.  NECK: Supple.  No apparent JVD.  RESP:  No IWOB.  Fair aeration bilaterally. CVS:  RRR. Heart sounds normal.  ABD/GI/GU: BS+. Abd soft.  Appropriately tender.  Honeycomb dressing. MSK/EXT:  Moves extremities. No apparent deformity. No edema.  SKIN: no apparent skin lesion or wound NEURO: Awake and alert. Oriented appropriately.  No apparent focal neuro deficit. PSYCH: Calm. Normal affect.   Procedures:  12/6-Flex sigmoidoscopy 12/7-sigmoid colectomy  Microbiology  summarized: COVID-19 and influenza PCR nonreactive  Assessment and plan: Principal Problem:   Syncope Active Problems:   Sigmoid volvulus (HCC)  Sigmoid volvulus: Incidental finding after presentation with what looks like syncope.  -S/p flex sigmoidoscopy without success on 12/6 -S/p sigmoid colectomy in 12/7 -Follow surgical path -Pain control and diet per surgery -Encourage mobilization  Vasovagal syncope: Work-up including serial troponin, EKG, telemetry and echocardiogram reassuring.  No significant cardiopulmonary finding on CT chest.  No significant personal or family history of cardiac disease.  No exertional dyspnea, syncope or chest pain.  Could be vasovagal from sigmoid volvulus.  -Continue telemetry monitoring  Sinus tachycardia: Likely from dehydration and pain. -NS bolus 1 L -Surgery starting clear liquid diet.  Hyponatremia: Likely from dehydration -IV fluid as above.  Nocturnal cough: Could be due to allergy or/and/GERD.  CT C/A/P without acute finding other than sigmoid volvulus. -Would like to hold off medications for now -Encouraged lifestyle change  Obesity Body mass index is 33.74 kg/m. -Encouraged lifestyle change.          DVT prophylaxis:  enoxaparin (LOVENOX) injection 40 mg Start: 12/24/21 1000 SCDs Start: 12/22/21 0028  Code Status: Full code Family Communication: None at bedside Level of care: Telemetry Status is: Inpatient The patient will remain inpatient because: Sigmoid volvulus   Final disposition: Home Consultants:  General surgery Gastroenterology  Sch Meds:  Scheduled Meds:  acetaminophen  1,000 mg Oral Q6H   docusate sodium  100 mg Oral BID   enoxaparin (LOVENOX) injection  40 mg Subcutaneous  Q24H   gabapentin  300 mg Oral TID   sodium chloride flush  3 mL Intravenous Q12H   Continuous Infusions:  sodium chloride 500 mL/hr at 12/24/21 0857   methocarbamol (ROBAXIN) IV 500 mg (12/23/21 1947)   PRN  Meds:.  Antimicrobials: Anti-infectives (From admission, onward)    Start     Dose/Rate Route Frequency Ordered Stop   12/23/21 1400  cefoTEtan (CEFOTAN) 2 g in sodium chloride 0.9 % 100 mL IVPB        2 g 200 mL/hr over 30 Minutes Intravenous 60 min pre-op 12/23/21 1040 12/23/21 1444        I have personally reviewed the following labs and images: CBC: Recent Labs  Lab 12/21/21 0029 12/23/21 0610 12/24/21 0609  WBC 9.2 5.9 10.2  HGB 14.7 14.7 15.9  HCT 43.9 44.3 47.8  MCV 83.8 86.0 83.7  PLT 252 239 265   BMP &GFR Recent Labs  Lab 12/21/21 0029 12/23/21 0610 12/24/21 0609  NA 138 138 132*  K 3.7 4.2 4.5  CL 109 105 99  CO2 22 25 23   GLUCOSE 96 89 113*  BUN 15 11 10   CREATININE 0.97 0.97 1.14  CALCIUM 9.0 8.8* 8.5*  MG  --  2.0  --    Estimated Creatinine Clearance: 114.9 mL/min (by C-G formula based on SCr of 1.14 mg/dL). Liver & Pancreas: Recent Labs  Lab 12/23/21 0610  AST 30  ALT 34  ALKPHOS 79  BILITOT 1.7*  PROT 6.6  ALBUMIN 3.4*   No results for input(s): "LIPASE", "AMYLASE" in the last 168 hours. No results for input(s): "AMMONIA" in the last 168 hours. Diabetic: No results for input(s): "HGBA1C" in the last 72 hours. Recent Labs  Lab 12/22/21 0453 12/23/21 0717 12/24/21 0610  GLUCAP 82 80 120*   Cardiac Enzymes: No results for input(s): "CKTOTAL", "CKMB", "CKMBINDEX", "TROPONINI" in the last 168 hours. No results for input(s): "PROBNP" in the last 8760 hours. Coagulation Profile: No results for input(s): "INR", "PROTIME" in the last 168 hours. Thyroid Function Tests: Recent Labs    12/23/21 0610  TSH 2.242   Lipid Profile: No results for input(s): "CHOL", "HDL", "LDLCALC", "TRIG", "CHOLHDL", "LDLDIRECT" in the last 72 hours. Anemia Panel: No results for input(s): "VITAMINB12", "FOLATE", "FERRITIN", "TIBC", "IRON", "RETICCTPCT" in the last 72 hours. Urine analysis:    Component Value Date/Time   COLORURINE YELLOW 12/21/2021  0631   APPEARANCEUR CLEAR 12/21/2021 0631   LABSPEC >1.046 (H) 12/21/2021 0631   PHURINE 5.0 12/21/2021 0631   GLUCOSEU NEGATIVE 12/21/2021 0631   HGBUR NEGATIVE 12/21/2021 0631   BILIRUBINUR NEGATIVE 12/21/2021 0631   KETONESUR NEGATIVE 12/21/2021 0631   PROTEINUR NEGATIVE 12/21/2021 0631   NITRITE NEGATIVE 12/21/2021 0631   LEUKOCYTESUR NEGATIVE 12/21/2021 0631   Sepsis Labs: Invalid input(s): "PROCALCITONIN", "LACTICIDVEN"  Microbiology: Recent Results (from the past 240 hour(s))  Resp Panel by RT-PCR (Flu A&B, Covid) Anterior Nasal Swab     Status: None   Collection Time: 12/21/21 12:29 AM   Specimen: Anterior Nasal Swab  Result Value Ref Range Status   SARS Coronavirus 2 by RT PCR NEGATIVE NEGATIVE Final    Comment: (NOTE) SARS-CoV-2 target nucleic acids are NOT DETECTED.  The SARS-CoV-2 RNA is generally detectable in upper respiratory specimens during the acute phase of infection. The lowest concentration of SARS-CoV-2 viral copies this assay can detect is 138 copies/mL. A negative result does not preclude SARS-Cov-2 infection and should not be used as the sole basis for  treatment or other patient management decisions. A negative result may occur with  improper specimen collection/handling, submission of specimen other than nasopharyngeal swab, presence of viral mutation(s) within the areas targeted by this assay, and inadequate number of viral copies(<138 copies/mL). A negative result must be combined with clinical observations, patient history, and epidemiological information. The expected result is Negative.  Fact Sheet for Patients:  EntrepreneurPulse.com.au  Fact Sheet for Healthcare Providers:  IncredibleEmployment.be  This test is no t yet approved or cleared by the [place] FDA and  has been authorized for detection and/or diagnosis of SARS-CoV-2 by FDA under an Emergency Use Authorization (EUA). This EUA will remain   in effect (meaning this test can be used) for the duration of the COVID-19 declaration under Section 564(b)(1) of the Act, 21 U.S.C.section 360bbb-3(b)(1), unless the authorization is terminated  or revoked sooner.       Influenza A by PCR NEGATIVE NEGATIVE Final   Influenza B by PCR NEGATIVE NEGATIVE Final    Comment: (NOTE) The Xpert Xpress SARS-CoV-2/FLU/RSV plus assay is intended as an aid in the diagnosis of influenza from Nasopharyngeal swab specimens and should not be used as a sole basis for treatment. Nasal washings and aspirates are unacceptable for Xpert Xpress SARS-CoV-2/FLU/RSV testing.  Fact Sheet for Patients: EntrepreneurPulse.com.au  Fact Sheet for Healthcare Providers: IncredibleEmployment.be  This test is not yet approved or cleared by the [place] FDA and has been authorized for detection and/or diagnosis of SARS-CoV-2 by FDA under an Emergency Use Authorization (EUA). This EUA will remain in effect (meaning this test can be used) for the duration of the COVID-19 declaration under Section 564(b)(1) of the Act, 21 U.S.C. section 360bbb-3(b)(1), unless the authorization is terminated or revoked.  Performed at [facility], [place] [address], [place], [place] [address]     Radiology Studies: No results found.    Tannor Pyon T. [place]  If 7PM-7AM, please contact night-coverage [email or web address] 12/24/2021, 1:45 PM

## 2021-12-24 NOTE — Progress Notes (Signed)
Progress Note: General Surgery Service   Chief Complaint/Subjective: Having expected post op pain. Has been tachycardic with mobilization but denies subjective symptoms. Denies bowel function post op. No nausea or emesis  Objective: Vital signs in last 24 hours: Temp:  [97.9 F (36.6 C)-99.4 F (37.4 C)] 98.4 F (36.9 C) (12/08 0510) Pulse Rate:  [96-116] 96 (12/08 0510) Resp:  [16-29] 19 (12/08 0510) BP: (115-149)/(76-97) 116/79 (12/08 0510) SpO2:  [94 %-100 %] 97 % (12/08 0510) Weight:  [010 kg] 116 kg (12/07 1315) Last BM Date : 12/23/21  Intake/Output from previous day: 12/07 0701 - 12/08 0700 In: 2723 [P.O.:120; I.V.:2503; IV Piggyback:100] Out: 1000 [Urine:900; Blood:100] Intake/Output this shift: No intake/output data recorded.  GI: Abd soft, mild distention. Appropriately TTP around midline incision which has honeycomb in place. Staples visible c/d/I, gauze over penrose distal wound c/d/i  Lab Results: CBC  Recent Labs    12/23/21 0610 12/24/21 0609  WBC 5.9 10.2  HGB 14.7 15.9  HCT 44.3 47.8  PLT 239 265    BMET Recent Labs    12/23/21 0610 12/24/21 0609  NA 138 132*  K 4.2 4.5  CL 105 99  CO2 25 23  GLUCOSE 89 113*  BUN 11 10  CREATININE 0.97 1.14  CALCIUM 8.8* 8.5*    PT/INR No results for input(s): "LABPROT", "INR" in the last 72 hours. ABG No results for input(s): "PHART", "HCO3" in the last 72 hours.  Invalid input(s): "PCO2", "PO2"  Anti-infectives: Anti-infectives (From admission, onward)    Start     Dose/Rate Route Frequency Ordered Stop   12/23/21 1400  cefoTEtan (CEFOTAN) 2 g in sodium chloride 0.9 % 100 mL IVPB        2 g 200 mL/hr over 30 Minutes Intravenous 60 min pre-op 12/23/21 1040 12/23/21 1444       Medications: Scheduled Meds:  acetaminophen  650 mg Oral Q6H   docusate sodium  100 mg Oral BID   gabapentin  300 mg Oral TID   sodium chloride flush  3 mL Intravenous Q12H   Continuous Infusions:  methocarbamol  (ROBAXIN) IV 500 mg (12/23/21 1947)   methocarbamol     PRN Meds:.  Assessment/Plan: Sigmoid volvulus POD 1 s/p sigmoid colectomy, mobilization of splenic flexure Dr. 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 Denies flatus or BM post op - states charted BM was prior to sx. Continue CLD for now - encourage mobilization and IS use  FEN: CLD ID: [email or web address] periop VTE: lovenox start today    LOS: 2 days    Eric Form, [facility] 12/24/2021, 7:52 AM Please see Amion for pager number during day hours 7:00am-4:30pm

## 2021-12-24 NOTE — Discharge Instructions (Addendum)
Wet to Dry WOUND CARE: - Change dressing once daily - Supplies: sterile saline, gauze, scissors, ABD pads, tape  Remove dressing and all packing carefully, moistening with sterile saline as needed to avoid packing/internal dressing sticking to the wound. 2.   Clean edges of skin around the wound with water/gauze, making sure there is no tape debris or leakage left on skin that could cause skin irritation or breakdown. 3.   Dampen a clean gauze with sterile saline and pack wound from wound base to skin level. Wound can be packed loosely. Trim gauze to size if a whole gauze is not required. 4.   Cover wound with a dry ABD pad and secure with tape.  5.   Write the date/time on the dry dressing/tape to better track when the last dressing change occurred. - change dressing as needed if leakage occurs, wound gets contaminated, or patient requests to shower. - You may shower daily with wound open and following the shower the wound should be dried and a clean dressing placed.  - Medical grade tape as well as packing supplies can be found at [employer] on [address] or [employer] on [address]. The remaining supplies can be found at your local drug store, [employer] etc.    [facility], [place] [phone]  OPEN ABDOMINAL SURGERY: POST OP INSTRUCTIONS  Always review your discharge instruction sheet given to you by the facility where your surgery was performed.  IF YOU HAVE DISABILITY OR FAMILY LEAVE FORMS, YOU MUST BRING THEM TO THE OFFICE FOR PROCESSING.  PLEASE DO NOT GIVE THEM TO YOUR DOCTOR.  A prescription for pain medication may be given to you upon discharge.  Take your pain medication as prescribed, if needed.  If narcotic pain medicine is not needed, then you may take acetaminophen (Tylenol) or ibuprofen (Advil) as needed. Take your usually prescribed medications unless otherwise directed. If you need a refill on your pain medication, please  contact your pharmacy. They will contact our office to request authorization.  Prescriptions will not be filled after 5pm or on week-ends. You should follow a light diet the first few days after arrival home, such as soup and crackers, pudding, etc.unless your doctor has advised otherwise. A high-fiber, low fat diet can be resumed as tolerated.   Be sure to include lots of fluids daily. Most patients will experience some swelling and bruising on the chest and neck area.  Ice packs will help.  Swelling and bruising can take several days to resolve Most patients will experience some swelling and bruising in the area of the incision. Ice pack will help. Swelling and bruising can take several days to resolve..  It is common to experience some constipation if taking pain medication after surgery.  Increasing fluid intake and taking a stool softener will usually help or prevent this problem from occurring.  A mild laxative (Milk of Magnesia or Miralax) should be taken according to package directions if there are no bowel movements after 48 hours.  You may have steri-strips (small skin tapes) in place directly over the incision.  These strips should be left on the skin for 7-10 days.  If your surgeon used skin glue on the incision, you may shower in 24 hours.  The glue will flake off over the next 2-3 weeks.  Any sutures or staples will be removed at the office during your follow-up visit. You may find that a light gauze bandage over  your incision may keep your staples from being rubbed or pulled. You may shower and replace the bandage daily. ACTIVITIES:  You may resume regular (light) daily activities beginning the next day--such as daily self-care, walking, climbing stairs--gradually increasing activities as tolerated.  You may have sexual intercourse when it is comfortable.  Refrain from any heavy lifting or straining until approved by your doctor. You may drive when you no longer are taking prescription pain  medication, you can comfortably wear a seatbelt, and you can safely maneuver your car and apply brakes  You should see your doctor in the office for a follow-up appointment approximately two weeks after your surgery.  Make sure that you call for this appointment within a day or two after you arrive home to insure a convenient appointment time.   WHEN TO CALL YOUR DOCTOR: Fever over 101.0 Inability to urinate Nausea and/or vomiting Extreme swelling or bruising Continued bleeding from incision. Increased pain, redness, or drainage from the incision. Difficulty swallowing or breathing Muscle cramping or spasms. Numbness or tingling in hands or feet or around lips.  The clinic staff is available to answer your questions during regular business hours.  Please don't hesitate to call and ask to speak to one of the nurses if you have concerns.  For further questions, please visit [email or web address]

## 2021-12-25 DIAGNOSIS — K56609 Unspecified intestinal obstruction, unspecified as to partial versus complete obstruction: Secondary | ICD-10-CM

## 2021-12-25 LAB — CBC
HCT: 43.1 % (ref 39.0–52.0)
Hemoglobin: 14.2 g/dL (ref 13.0–17.0)
MCH: 28.1 pg (ref 26.0–34.0)
MCHC: 32.9 g/dL (ref 30.0–36.0)
MCV: 85.3 fL (ref 80.0–100.0)
Platelets: 263 10*3/uL (ref 150–400)
RBC: 5.05 MIL/uL (ref 4.22–5.81)
RDW: 13.8 % (ref 11.5–15.5)
WBC: 11.1 10*3/uL — ABNORMAL HIGH (ref 4.0–10.5)
nRBC: 0 % (ref 0.0–0.2)

## 2021-12-25 LAB — COMPREHENSIVE METABOLIC PANEL
ALT: 27 U/L (ref 0–44)
AST: 33 U/L (ref 15–41)
Albumin: 3.1 g/dL — ABNORMAL LOW (ref 3.5–5.0)
Alkaline Phosphatase: 64 U/L (ref 38–126)
Anion gap: 7 (ref 5–15)
BUN: 10 mg/dL (ref 6–20)
CO2: 25 mmol/L (ref 22–32)
Calcium: 8.5 mg/dL — ABNORMAL LOW (ref 8.9–10.3)
Chloride: 104 mmol/L (ref 98–111)
Creatinine, Ser: 0.97 mg/dL (ref 0.61–1.24)
GFR, Estimated: >60 mL/min (ref >60)
Glucose, Bld: 125 mg/dL — ABNORMAL HIGH (ref 70–99)
Potassium: 4.4 mmol/L (ref 3.5–5.1)
Sodium: 136 mmol/L (ref 135–145)
Total Bilirubin: 1.1 mg/dL (ref 0.3–1.2)
Total Protein: 6.2 g/dL — ABNORMAL LOW (ref 6.5–8.1)

## 2021-12-25 LAB — MAGNESIUM: Magnesium: 1.9 mg/dL (ref 1.7–2.4)

## 2021-12-25 LAB — GLUCOSE, CAPILLARY: Glucose-Capillary: 133 mg/dL — ABNORMAL HIGH (ref 70–99)

## 2021-12-25 LAB — PHOSPHORUS: Phosphorus: 2.9 mg/dL (ref 2.5–4.6)

## 2021-12-25 MED ORDER — METOCLOPRAMIDE HCL 5 MG/ML IJ SOLN
10.0000 mg | Freq: Four times a day (QID) | INTRAMUSCULAR | Status: DC
  Administered 2021-12-25 – 2021-12-27 (×8): 10 mg via INTRAVENOUS
  Filled 2021-12-25 (×8): qty 2

## 2021-12-25 MED ORDER — SODIUM CHLORIDE 0.9 % IV SOLN: INTRAVENOUS | Status: DC

## 2021-12-25 NOTE — Progress Notes (Signed)
Mobility Specialist - Progress Note   12/25/21 1108  Mobility  Activity Ambulated with assistance in hallway  Level of Assistance Modified independent, requires aide device or extra time  Assistive Device Front wheel walker  Distance Ambulated (ft) 500 ft  Activity Response Tolerated well  Mobility Referral Yes  $Mobility charge 1 Mobility   Pt received in bed and agreeable to mobility. Pt stated he felt a little dizzy, but was still eager to ambulate. No other complaints during mobility session. Pt to recliner after session with all needs met.     Maya Johnson Mobility Specialist  

## 2021-12-25 NOTE — Progress Notes (Signed)
PROGRESS NOTE  Nathaniel Hanson WEX:[id number] DOB: July 26, 1980   PCP: Creola Corn, MD  Patient is from: Home.  DOA: 12/21/2021 LOS: 2  Chief complaints Chief Complaint  Patient presents with   Syncopal Episode     Brief Narrative / Interim history: 41 year old M with no significant past medical history presenting with what looks like a syncopal episode and found to have sigmoid volvulus in ED.  General surgery and GI consulted.  Patient underwent flex sigmoidoscopy on 12/6 but abdominal x-ray with persistent massive dilation of sigmoid colon.  Patient underwent sigmoid colectomy and mobilization of splenic flexure on 12/7.  Syncope work-up including serial troponin, EKG, telemetry and echocardiogram unrevealing.  Syncope felt to be vasovagal in the setting of the above..   Subjective: Seen and examined earlier this morning.  No major events overnight of this morning.  Reports abdominal pain with cough.  No bowel movement or flatus yet.  Some nausea but no emesis.  Objective: Vitals:   12/23/21 2000 12/23/21 2019 12/24/21 0003 12/24/21 0510  BP: 131/81 115/80 128/76 116/79  Pulse: (!) 107 (!) 103 (!) 108 96  Resp:  16 18 19   Temp:  99.4 F (37.4 C) 97.9 F (36.6 C) 98.4 F (36.9 C)  TempSrc:  Oral Oral Oral  SpO2: 95% 94% 95% 97%  Weight:      Height:        Examination:  GENERAL: No apparent distress.  Nontoxic. HEENT: MMM.  Vision and hearing grossly intact.  NECK: Supple.  No apparent JVD.  RESP:  No IWOB.  Fair aeration bilaterally. CVS:  RRR. Heart sounds normal.  ABD/GI/GU: BS+. Abd soft, NTND.  Honeycomb dressing over surgical wound DCI. MSK/EXT:  Moves extremities. No apparent deformity. No edema.  SKIN: As above NEURO: Awake and alert. Oriented appropriately.  No apparent focal neuro deficit. PSYCH: Calm. Normal affect.   Procedures:  12/6-Flex sigmoidoscopy 12/7-sigmoid colectomy  Microbiology summarized: COVID-19 and influenza PCR  nonreactive  Assessment and plan: Principal Problem:   Syncope Active Problems:   Sigmoid volvulus (HCC)  Sigmoid volvulus: Incidental finding after presentation with what looks like syncope.  -S/p flex sigmoidoscopy without success on 12/6 -S/p sigmoid colectomy in 12/7 -No BM or flatus yet. -Follow surgical path -Pain control and diet per surgery -Ambulation every 4 hours while awake and OOB for meals.  Discussed the risk of ileus and emphasized the importance of ambulation and staying out of the bed.   Vasovagal syncope: Work-up including serial troponin, EKG, telemetry and echocardiogram reassuring.  No significant cardiopulmonary finding on CT chest.  No significant personal or family history of cardiac disease.  No exertional dyspnea, syncope or chest pain.  Could be vasovagal from sigmoid volvulus.  -Continue telemetry monitoring  Sinus tachycardia: Likely from dehydration and pain.  Resolved.  Hyponatremia: Likely from dehydration -IV fluid as above.  Nocturnal cough: Could be due to allergy or/and/GERD.  CT C/A/P without acute finding other than sigmoid volvulus. -Would like to hold off medications for now -Encouraged lifestyle change  Obesity Body mass index is 33.74 kg/m. -Encouraged lifestyle change.          DVT prophylaxis:  enoxaparin (LOVENOX) injection 40 mg Start: 12/24/21 1000 SCDs Start: 12/22/21 0028  Code Status: Full code Family Communication: None at bedside Level of care: Telemetry Status is: Inpatient The patient will remain inpatient because: Sigmoid volvulus   Final disposition: Home Consultants:  General surgery Gastroenterology  Sch Meds:  Scheduled Meds:  acetaminophen  1,000  mg Oral Q6H   docusate sodium  100 mg Oral BID   enoxaparin (LOVENOX) injection  40 mg Subcutaneous Q24H   gabapentin  300 mg Oral TID   sodium chloride flush  3 mL Intravenous Q12H   Continuous Infusions:  sodium chloride 500 mL/hr at 12/24/21 0857    methocarbamol (ROBAXIN) IV 500 mg (12/23/21 1947)   PRN Meds:.  Antimicrobials: Anti-infectives (From admission, onward)    Start     Dose/Rate Route Frequency Ordered Stop   12/23/21 1400  cefoTEtan (CEFOTAN) 2 g in sodium chloride 0.9 % 100 mL IVPB        2 g 200 mL/hr over 30 Minutes Intravenous 60 min pre-op 12/23/21 1040 12/23/21 1444        I have personally reviewed the following labs and images: CBC: Recent Labs  Lab 12/21/21 0029 12/23/21 0610 12/24/21 0609  WBC 9.2 5.9 10.2  HGB 14.7 14.7 15.9  HCT 43.9 44.3 47.8  MCV 83.8 86.0 83.7  PLT 252 239 265   BMP &GFR Recent Labs  Lab 12/21/21 0029 12/23/21 0610 12/24/21 0609  NA 138 138 132*  K 3.7 4.2 4.5  CL 109 105 99  CO2 22 25 23   GLUCOSE 96 89 113*  BUN 15 11 10   CREATININE 0.97 0.97 1.14  CALCIUM 9.0 8.8* 8.5*  MG  --  2.0  --    Estimated Creatinine Clearance: 114.9 mL/min (by C-G formula based on SCr of 1.14 mg/dL). Liver & Pancreas: Recent Labs  Lab 12/23/21 0610  AST 30  ALT 34  ALKPHOS 79  BILITOT 1.7*  PROT 6.6  ALBUMIN 3.4*   No results for input(s): "LIPASE", "AMYLASE" in the last 168 hours. No results for input(s): "AMMONIA" in the last 168 hours. Diabetic: No results for input(s): "HGBA1C" in the last 72 hours. Recent Labs  Lab 12/22/21 0453 12/23/21 0717 12/24/21 0610  GLUCAP 82 80 120*   Cardiac Enzymes: No results for input(s): "CKTOTAL", "CKMB", "CKMBINDEX", "TROPONINI" in the last 168 hours. No results for input(s): "PROBNP" in the last 8760 hours. Coagulation Profile: No results for input(s): "INR", "PROTIME" in the last 168 hours. Thyroid Function Tests: Recent Labs    12/23/21 0610  TSH 2.242   Lipid Profile: No results for input(s): "CHOL", "HDL", "LDLCALC", "TRIG", "CHOLHDL", "LDLDIRECT" in the last 72 hours. Anemia Panel: No results for input(s): "VITAMINB12", "FOLATE", "FERRITIN", "TIBC", "IRON", "RETICCTPCT" in the last 72 hours. Urine analysis:     Component Value Date/Time   COLORURINE YELLOW 12/21/2021 0631   APPEARANCEUR CLEAR 12/21/2021 0631   LABSPEC >1.046 (H) 12/21/2021 0631   PHURINE 5.0 12/21/2021 0631   GLUCOSEU NEGATIVE 12/21/2021 0631   HGBUR NEGATIVE 12/21/2021 0631   BILIRUBINUR NEGATIVE 12/21/2021 0631   KETONESUR NEGATIVE 12/21/2021 0631   PROTEINUR NEGATIVE 12/21/2021 0631   NITRITE NEGATIVE 12/21/2021 0631   LEUKOCYTESUR NEGATIVE 12/21/2021 0631   Sepsis Labs: Invalid input(s): "PROCALCITONIN", "LACTICIDVEN"  Microbiology: Recent Results (from the past 240 hour(s))  Resp Panel by RT-PCR (Flu A&B, Covid) Anterior Nasal Swab     Status: None   Collection Time: 12/21/21 12:29 AM   Specimen: Anterior Nasal Swab  Result Value Ref Range Status   SARS Coronavirus 2 by RT PCR NEGATIVE NEGATIVE Final    Comment: (NOTE) SARS-CoV-2 target nucleic acids are NOT DETECTED.  The SARS-CoV-2 RNA is generally detectable in upper respiratory specimens during the acute phase of infection. The lowest concentration of SARS-CoV-2 viral copies this assay can detect  is 138 copies/mL. A negative result does not preclude SARS-Cov-2 infection and should not be used as the sole basis for treatment or other patient management decisions. A negative result may occur with  improper specimen collection/handling, submission of specimen other than nasopharyngeal swab, presence of viral mutation(s) within the areas targeted by this assay, and inadequate number of viral copies(<138 copies/mL). A negative result must be combined with clinical observations, patient history, and epidemiological information. The expected result is Negative.  Fact Sheet for Patients:  BloggerCourse.com  Fact Sheet for Healthcare Providers:  SeriousBroker.it  This test is no t yet approved or cleared by the [place] FDA and  has been authorized for detection and/or diagnosis of SARS-CoV-2 by FDA under  an Emergency Use Authorization (EUA). This EUA will remain  in effect (meaning this test can be used) for the duration of the COVID-19 declaration under Section 564(b)(1) of the Act, 21 U.S.C.section 360bbb-3(b)(1), unless the authorization is terminated  or revoked sooner.       Influenza A by PCR NEGATIVE NEGATIVE Final   Influenza B by PCR NEGATIVE NEGATIVE Final    Comment: (NOTE) The Xpert Xpress SARS-CoV-2/FLU/RSV plus assay is intended as an aid in the diagnosis of influenza from Nasopharyngeal swab specimens and should not be used as a sole basis for treatment. Nasal washings and aspirates are unacceptable for Xpert Xpress SARS-CoV-2/FLU/RSV testing.  Fact Sheet for Patients: BloggerCourse.com  Fact Sheet for Healthcare Providers: SeriousBroker.it  This test is not yet approved or cleared by the [place] FDA and has been authorized for detection and/or diagnosis of SARS-CoV-2 by FDA under an Emergency Use Authorization (EUA). This EUA will remain in effect (meaning this test can be used) for the duration of the COVID-19 declaration under Section 564(b)(1) of the Act, 21 U.S.C. section 360bbb-3(b)(1), unless the authorization is terminated or revoked.  Performed at [facility], [address], [place], [place] [address]     Radiology Studies: No results found.    Nasif Bos T. Velisa Regnier [facility]  If 7PM-7AM, please contact night-coverage [email or web address] 12/24/2021, 1:45 PM

## 2021-12-25 NOTE — Progress Notes (Signed)
2 Days Post-Op   Subjective/Chief Complaint: No flatus or bowel movements yet Mild nausea - no vomiting Does not feel distended   Objective: Vital signs in last 24 hours: Temp:  [97.7 F (36.5 C)-99.2 F (37.3 C)] 97.7 F (36.5 C) (12/09 0443) Pulse Rate:  [97-100] 99 (12/09 0443) Resp:  [18-20] 20 (12/09 0443) BP: (133-139)/(76-95) 138/95 (12/09 0443) SpO2:  [91 %-96 %] 91 % (12/09 0443) Weight:  [211 kg] 116 kg (12/09 0500) Last BM Date : 12/23/21  Intake/Output from previous day: 12/08 0701 - 12/09 0700 In: 2674.7 [P.O.:1501; I.V.:1118.7; IV Piggyback:55] Out: 1625 [Urine:1625] Intake/Output this shift: No intake/output data recorded.  GI: Abd soft, mild distention. Appropriately TTP around midline incision which has honeycomb in place. Staples visible c/d/I, gauze over penrose distal wound c/d/i   Lab Results:  Recent Labs    12/24/21 0609 12/25/21 0625  WBC 10.2 11.1*  HGB 15.9 14.2  HCT 47.8 43.1  PLT 265 263   BMET Recent Labs    12/24/21 0609 12/25/21 0625  NA 132* 136  K 4.5 4.4  CL 99 104  CO2 23 25  GLUCOSE 113* 125*  BUN 10 10  CREATININE 1.14 0.97  CALCIUM 8.5* 8.5*   PT/INR No results for input(s): "LABPROT", "INR" in the last 72 hours. ABG No results for input(s): "PHART", "HCO3" in the last 72 hours.  Invalid input(s): "PCO2", "PO2"  Studies/Results: No results found.  Anti-infectives: Anti-infectives (From admission, onward)    Start     Dose/Rate Route Frequency Ordered Stop   12/23/21 1400  cefoTEtan (CEFOTAN) 2 g in sodium chloride 0.9 % 100 mL IVPB        2 g 200 mL/hr over 30 Minutes Intravenous 60 min pre-op 12/23/21 1040 12/23/21 1444       Assessment/Plan: Sigmoid volvulus POD 2 s/p sigmoid colectomy, mobilization of splenic flexure Dr. 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

## 2021-12-26 LAB — GLUCOSE, CAPILLARY: Glucose-Capillary: 110 mg/dL — ABNORMAL HIGH (ref 70–99)

## 2021-12-26 MED ORDER — MELATONIN 3 MG PO TABS
3.0000 mg | ORAL_TABLET | Freq: Once | ORAL | Status: AC
  Administered 2021-12-26: 3 mg via ORAL
  Filled 2021-12-26: qty 1

## 2021-12-26 MED ORDER — SIMETHICONE 80 MG PO CHEW
80.0000 mg | CHEWABLE_TABLET | Freq: Four times a day (QID) | ORAL | Status: DC | PRN
  Administered 2021-12-27: 80 mg via ORAL
  Filled 2021-12-26: qty 1

## 2021-12-26 NOTE — Progress Notes (Signed)
PROGRESS NOTE  Nathaniel Hanson RSW:[id number] DOB: July 19, 1980   PCP: Creola Corn, MD  Patient is from: Home.  DOA: 12/21/2021 LOS: 4  Chief complaints Chief Complaint  Patient presents with   Syncopal Episode     Brief Narrative / Interim history: 41 year old M with no significant past medical history presenting with what looks like a syncopal episode and found to have sigmoid volvulus in ED.  General surgery and GI consulted.  Patient underwent flex sigmoidoscopy on 12/6 but abdominal x-ray with persistent massive dilation of sigmoid colon.  Patient underwent sigmoid colectomy and mobilization of splenic flexure on 12/7.  Awaiting bowel return of bowel function.  General surgery following.  Syncope work-up including serial troponin, EKG, telemetry and echocardiogram unrevealing.  Syncope felt to be vasovagal in the setting of the above..   Subjective: Seen and examined earlier this morning.  No major events overnight of this morning.  Reports nausea but no emesis.  Reports 4/10 abdominal pain.  He reports flatus but no bowel movements.  On clear liquid diet.  Objective: Vitals:   12/26/21 0000 12/26/21 0400 12/26/21 0813 12/26/21 1256  BP: 126/84 130/79 123/70 (!) 150/89  Pulse: 92 94 93 83  Resp:   20 20  Temp: 99.2 F (37.3 C) 98.7 F (37.1 C) 99.3 F (37.4 C) 97.8 F (36.6 C)  TempSrc: Oral Oral Oral Oral  SpO2: 97% 98% 97% 96%  Weight:  116 kg    Height:        Examination:  GENERAL: No apparent distress.  Nontoxic. HEENT: MMM.  Vision and hearing grossly intact.  NECK: Supple.  No apparent JVD.  RESP:  No IWOB.  Fair aeration bilaterally. CVS:  RRR. Heart sounds normal.  ABD/GI/GU: BS+. Abd soft, NTND.  Honeycomb dressing over surgical wound DCI. MSK/EXT:  Moves extremities. No apparent deformity. No edema.  SKIN: no apparent skin lesion or wound NEURO: Sleep but wakes to voice.  No apparent focal neuro deficit. PSYCH: Calm. Normal affect.   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  -S/p flex sigmoidoscopy without success on 12/6 -S/p sigmoid colectomy in 12/7 -Reports flatus but no bowel movements. -Follow surgical path -Pain control and diet per surgery.  Agree with IV fluid. -Ambulation every 4 hours while awake and OOB for meals.  Discussed the risk of ileus and emphasized the importance of ambulation and staying out of the bed.   Vasovagal syncope: Work-up including serial troponin, EKG, telemetry and echocardiogram reassuring.  No significant cardiopulmonary finding on CT chest.  No significant personal or family history of cardiac disease.  No exertional dyspnea, syncope or chest pain.  Could be vasovagal from sigmoid volvulus.  -Continue telemetry monitoring  Sinus tachycardia: Likely from dehydration and pain.  Resolved.  Hyponatremia: Likely from dehydration.  Resolved.  Nocturnal cough: Due to allergy or/and/GERD?  Patient is not a great historian.  CT C/A/P without acute finding other than sigmoid volvulus. -Encouraged lifestyle change.  Not interested in medications  Obesity Body mass index is 33.74 kg/m. -Encouraged lifestyle change.          DVT prophylaxis:  enoxaparin (LOVENOX) injection 40 mg Start: 12/24/21 1000 SCDs Start: 12/22/21 0028  Code Status: Full code Family Communication: None at bedside Level of care:  Telemetry Status is: Inpatient The patient will remain inpatient because: Sigmoid volvulus   Final disposition: Home Consultants:  General surgery Gastroenterology  Sch Meds:  Scheduled Meds:  acetaminophen  1,000 mg Oral Q6H   docusate sodium  100 mg Oral BID   enoxaparin (LOVENOX)  injection  40 mg Subcutaneous Q24H   gabapentin  300 mg Oral TID   metoCLOPramide (REGLAN) injection  10 mg Intravenous Q6H   sodium chloride flush  3 mL Intravenous Q12H   Continuous Infusions:  sodium chloride 100 mL/hr at 12/25/21 1738   methocarbamol (ROBAXIN) IV 500 mg (12/25/21 1512)   PRN Meds:.  Antimicrobials: Anti-infectives (From admission, onward)    Start     Dose/Rate Route Frequency Ordered Stop   12/23/21 1400  cefoTEtan (CEFOTAN) 2 g in sodium chloride 0.9 % 100 mL IVPB        2 g 200 mL/hr over 30 Minutes Intravenous 60 min pre-op 12/23/21 1040 12/23/21 1444        I have personally reviewed the following labs and images: CBC: Recent Labs  Lab 12/21/21 0029 12/23/21 0610 12/24/21 0609 12/25/21 0625  WBC 9.2 5.9 10.2 11.1*  HGB 14.7 14.7 15.9 14.2  HCT 43.9 44.3 47.8 43.1  MCV 83.8 86.0 83.7 85.3  PLT 252 239 265 263   BMP &GFR Recent Labs  Lab 12/21/21 0029 12/23/21 0610 12/24/21 0609 12/25/21 0625  NA 138 138 132* 136  K 3.7 4.2 4.5 4.4  CL 109 105 99 104  CO2 22 25 23 25   GLUCOSE 96 89 113* 125*  BUN 15 11 10 10   CREATININE 0.97 0.97 1.14 0.97  CALCIUM 9.0 8.8* 8.5* 8.5*  MG  --  2.0  --  1.9  PHOS  --   --   --  2.9   Estimated Creatinine Clearance: 135 mL/min (by C-G formula based on SCr of 0.97 mg/dL). Liver & Pancreas: Recent Labs  Lab 12/23/21 0610 12/25/21 0625  AST 30 33  ALT 34 27  ALKPHOS 79 64  BILITOT 1.7* 1.1  PROT 6.6 6.2*  ALBUMIN 3.4* 3.1*   No results for input(s): "LIPASE", "AMYLASE" in the last 168 hours. No results for input(s): "AMMONIA" in the last 168 hours. Diabetic: No results for input(s): "HGBA1C" in the last 72 hours. Recent Labs  Lab 12/22/21 0453 12/23/21 0717 12/24/21 0610 12/25/21 0438 12/26/21 0750  GLUCAP 82 80 120* 133* 110*   Cardiac Enzymes: No results for input(s): "CKTOTAL", "CKMB", "CKMBINDEX", "TROPONINI" in the last 168 hours. No results for input(s): "PROBNP" in the last  8760 hours. Coagulation Profile: No results for input(s): "INR", "PROTIME" in the last 168 hours. Thyroid Function Tests: No results for input(s): "TSH", "T4TOTAL", "FREET4", "T3FREE", "THYROIDAB" in the last 72 hours.  Lipid Profile: No results for input(s): "CHOL", "HDL", "LDLCALC", "TRIG", "CHOLHDL", "LDLDIRECT" in the last 72 hours. Anemia Panel: No results for input(s): "VITAMINB12", "FOLATE", "FERRITIN", "TIBC", "IRON", "RETICCTPCT" in the last 72 hours. Urine analysis:    Component Value Date/Time   COLORURINE YELLOW 12/21/2021 0631   APPEARANCEUR CLEAR 12/21/2021 0631   LABSPEC >1.046 (H) 12/21/2021 0631   PHURINE 5.0 12/21/2021 0631   GLUCOSEU NEGATIVE 12/21/2021 0631   HGBUR NEGATIVE 12/21/2021 0631   BILIRUBINUR NEGATIVE 12/21/2021 0631   KETONESUR NEGATIVE 12/21/2021 0631   PROTEINUR NEGATIVE 12/21/2021 0631   NITRITE NEGATIVE 12/21/2021 0631   LEUKOCYTESUR NEGATIVE 12/21/2021 0631   Sepsis Labs: Invalid input(s): "PROCALCITONIN", "LACTICIDVEN"  Microbiology: Recent Results (from the past  240 hour(s))  Resp Panel by RT-PCR (Flu A&B, Covid) Anterior Nasal Swab     Status: None   Collection Time: 12/21/21 12:29 AM   Specimen: Anterior Nasal Swab  Result Value Ref Range Status   SARS Coronavirus 2 by RT PCR NEGATIVE NEGATIVE Final    Comment: (NOTE) SARS-CoV-2 target nucleic acids are NOT DETECTED.  The SARS-CoV-2 RNA is generally detectable in upper respiratory specimens during the acute phase of infection. The lowest concentration of SARS-CoV-2 viral copies this assay can detect is 138 copies/mL. A negative result does not preclude SARS-Cov-2 infection and should not be used as the sole basis for treatment or other patient management decisions. A negative result may occur with  improper specimen collection/handling, submission of specimen other than nasopharyngeal swab, presence of viral mutation(s) within the areas targeted by this assay, and inadequate  number of viral copies(<138 copies/mL). A negative result must be combined with clinical observations, patient history, and epidemiological information. The expected result is Negative.  Fact Sheet for Patients:  BloggerCourse.com  Fact Sheet for Healthcare Providers:  SeriousBroker.it  This test is no t yet approved or cleared by the [place] FDA and  has been authorized for detection and/or diagnosis of SARS-CoV-2 by FDA under an Emergency Use Authorization (EUA). This EUA will remain  in effect (meaning this test can be used) for the duration of the COVID-19 declaration under Section 564(b)(1) of the Act, 21 U.S.C.section 360bbb-3(b)(1), unless the authorization is terminated  or revoked sooner.       Influenza A by PCR NEGATIVE NEGATIVE Final   Influenza B by PCR NEGATIVE NEGATIVE Final    Comment: (NOTE) The Xpert Xpress SARS-CoV-2/FLU/RSV plus assay is intended as an aid in the diagnosis of influenza from Nasopharyngeal swab specimens and should not be used as a sole basis for treatment. Nasal washings and aspirates are unacceptable for Xpert Xpress SARS-CoV-2/FLU/RSV testing.  Fact Sheet for Patients: BloggerCourse.com  Fact Sheet for Healthcare Providers: SeriousBroker.it  This test is not yet approved or cleared by the [place] FDA and has been authorized for detection and/or diagnosis of SARS-CoV-2 by FDA under an Emergency Use Authorization (EUA). This EUA will remain in effect (meaning this test can be used) for the duration of the COVID-19 declaration under Section 564(b)(1) of the Act, 21 U.S.C. section 360bbb-3(b)(1), unless the authorization is terminated or revoked.  Performed at [facility], [address], [place], [place] [address]     Radiology Studies: No results found.    Hutton Pellicane T. Lavene Penagos [facility]  If  7PM-7AM, please contact night-coverage [email or web address] 12/26/2021, 2:43 PM

## 2021-12-26 NOTE — Progress Notes (Signed)
Mobility Specialist Cancellation/Refusal Note:  Pt declined mobility at this time. Pt stated he doesn't feel well today. Will check back as schedule permits.    [facility]

## 2021-12-26 NOTE — Progress Notes (Signed)
3 Days Post-Op   Subjective/Chief Complaint: Patient beginning to have some flatus Still feels distended with some nausea No BM yet On scheduled Reglan IV   Objective: Vital signs in last 24 hours: Temp:  [98.3 F (36.8 C)-99.8 F (37.7 C)] 98.7 F (37.1 C) (12/10 0400) Pulse Rate:  [92-112] 94 (12/10 0400) Resp:  [20] 20 (12/09 2000) BP: (125-151)/(74-95) 130/79 (12/10 0400) SpO2:  [91 %-98 %] 98 % (12/10 0400) Weight:  [814 kg] 116 kg (12/10 0400) Last BM Date : 12/23/21  Intake/Output from previous day: 12/09 0701 - 12/10 0700 In: 2294.7 [P.O.:440; I.V.:1804.7; IV Piggyback:50] Out: 725 [Urine:725] Intake/Output this shift: No intake/output data recorded.  Abd - distended Incisional tenderness Staple line c/d/I through honeycomb dressing; minimal drainage around Penrose drain  Lab Results:  Recent Labs    12/24/21 0609 12/25/21 0625  WBC 10.2 11.1*  HGB 15.9 14.2  HCT 47.8 43.1  PLT 265 263   BMET Recent Labs    12/24/21 0609 12/25/21 0625  NA 132* 136  K 4.5 4.4  CL 99 104  CO2 23 25  GLUCOSE 113* 125*  BUN 10 10  CREATININE 1.14 0.97  CALCIUM 8.5* 8.5*   PT/INR No results for input(s): "LABPROT", "INR" in the last 72 hours. ABG No results for input(s): "PHART", "HCO3" in the last 72 hours.  Invalid input(s): "PCO2", "PO2"  Studies/Results: No results found.  Anti-infectives: Anti-infectives (From admission, onward)    Start     Dose/Rate Route Frequency Ordered Stop   12/23/21 1400  cefoTEtan (CEFOTAN) 2 g in sodium chloride 0.9 % 100 mL IVPB        2 g 200 mL/hr over 30 Minutes Intravenous 60 min pre-op 12/23/21 1040 12/23/21 1444       Assessment/Plan: Sigmoid volvulus POD 3 s/p sigmoid colectomy, mobilization of splenic flexure Dr. 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

## 2021-12-27 ENCOUNTER — Inpatient Hospital Stay (HOSPITAL_COMMUNITY): Payer: Self-pay

## 2021-12-27 ENCOUNTER — Encounter (HOSPITAL_COMMUNITY)
Admission: EM | Disposition: A | Discharge status: Home / Self Care | Payer: Self-pay | Source: Home / Self Care | Attending: Internal Medicine

## 2021-12-27 ENCOUNTER — Inpatient Hospital Stay (HOSPITAL_COMMUNITY): Payer: Self-pay | Admitting: Certified Registered Nurse Anesthetist

## 2021-12-27 ENCOUNTER — Other Ambulatory Visit: Payer: Self-pay

## 2021-12-27 DIAGNOSIS — T8132XA Disruption of internal operation (surgical) wound, not elsewhere classified, initial encounter: Secondary | ICD-10-CM

## 2021-12-27 DIAGNOSIS — K567 Ileus, unspecified: Secondary | ICD-10-CM

## 2021-12-27 DIAGNOSIS — K9189 Other postprocedural complications and disorders of digestive system: Secondary | ICD-10-CM

## 2021-12-27 HISTORY — PX: LAPAROTOMY: SHX154

## 2021-12-27 LAB — CBC
HCT: 42.6 % (ref 39.0–52.0)
Hemoglobin: 14.1 g/dL (ref 13.0–17.0)
MCH: 28.2 pg (ref 26.0–34.0)
MCHC: 33.1 g/dL (ref 30.0–36.0)
MCV: 85.2 fL (ref 80.0–100.0)
Platelets: 315 10*3/uL (ref 150–400)
RBC: 5 MIL/uL (ref 4.22–5.81)
RDW: 13.7 % (ref 11.5–15.5)
WBC: 10.5 10*3/uL (ref 4.0–10.5)
nRBC: 0 % (ref 0.0–0.2)

## 2021-12-27 LAB — COMPREHENSIVE METABOLIC PANEL
ALT: 18 U/L (ref 0–44)
AST: 18 U/L (ref 15–41)
Albumin: 2.8 g/dL — ABNORMAL LOW (ref 3.5–5.0)
Alkaline Phosphatase: 60 U/L (ref 38–126)
Anion gap: 7 (ref 5–15)
BUN: 16 mg/dL (ref 6–20)
CO2: 25 mmol/L (ref 22–32)
Calcium: 8.5 mg/dL — ABNORMAL LOW (ref 8.9–10.3)
Chloride: 106 mmol/L (ref 98–111)
Creatinine, Ser: 0.93 mg/dL (ref 0.61–1.24)
GFR, Estimated: >60 mL/min (ref >60)
Glucose, Bld: 121 mg/dL — ABNORMAL HIGH (ref 70–99)
Potassium: 3.7 mmol/L (ref 3.5–5.1)
Sodium: 138 mmol/L (ref 135–145)
Total Bilirubin: 0.9 mg/dL (ref 0.3–1.2)
Total Protein: 6.1 g/dL — ABNORMAL LOW (ref 6.5–8.1)

## 2021-12-27 LAB — SURGICAL PATHOLOGY

## 2021-12-27 LAB — PHOSPHORUS: Phosphorus: 2.6 mg/dL (ref 2.5–4.6)

## 2021-12-27 LAB — MAGNESIUM: Magnesium: 2 mg/dL (ref 1.7–2.4)

## 2021-12-27 LAB — TSH: TSH: 1.651 u[IU]/mL (ref 0.350–4.500)

## 2021-12-27 LAB — GLUCOSE, CAPILLARY: Glucose-Capillary: 122 mg/dL — ABNORMAL HIGH (ref 70–99)

## 2021-12-27 SURGERY — LAPAROTOMY, EXPLORATORY
Anesthesia: General

## 2021-12-27 MED ORDER — "THROMBI-PAD 3""X3"" EX PADS"
1.0000 | MEDICATED_PAD | Freq: Once | CUTANEOUS | Status: DC
  Filled 2021-12-27: qty 1

## 2021-12-27 MED ORDER — ROCURONIUM BROMIDE 10 MG/ML (PF) SYRINGE
PREFILLED_SYRINGE | INTRAVENOUS | Status: DC | PRN
  Administered 2021-12-27: 10 mg via INTRAVENOUS
  Administered 2021-12-27: 40 mg via INTRAVENOUS

## 2021-12-27 MED ORDER — ACETAMINOPHEN 10 MG/ML IV SOLN
INTRAVENOUS | Status: DC | PRN
  Administered 2021-12-27: 1000 mg via INTRAVENOUS

## 2021-12-27 MED ORDER — PHENYLEPHRINE 80 MCG/ML (10ML) SYRINGE FOR IV PUSH (FOR BLOOD PRESSURE SUPPORT)
PREFILLED_SYRINGE | INTRAVENOUS | Status: AC
  Filled 2021-12-27: qty 10

## 2021-12-27 MED ORDER — HYDROMORPHONE HCL 1 MG/ML IJ SOLN
INTRAMUSCULAR | Status: DC | PRN
  Administered 2021-12-27: .5 mg via INTRAVENOUS
  Administered 2021-12-27: 1 mg via INTRAVENOUS
  Administered 2021-12-27: .5 mg via INTRAVENOUS

## 2021-12-27 MED ORDER — DEXMEDETOMIDINE HCL IN NACL 80 MCG/20ML IV SOLN
INTRAVENOUS | Status: AC
  Filled 2021-12-27: qty 20

## 2021-12-27 MED ORDER — ONDANSETRON HCL 4 MG/2ML IJ SOLN
4.0000 mg | Freq: Four times a day (QID) | INTRAMUSCULAR | Status: DC | PRN
  Administered 2021-12-30 – 2022-01-02 (×3): 4 mg via INTRAVENOUS
  Filled 2021-12-27 (×3): qty 2

## 2021-12-27 MED ORDER — EPHEDRINE 5 MG/ML INJ
INTRAVENOUS | Status: AC
  Filled 2021-12-27: qty 5

## 2021-12-27 MED ORDER — HYDROMORPHONE HCL 2 MG/ML IJ SOLN
INTRAMUSCULAR | Status: AC
  Filled 2021-12-27: qty 1

## 2021-12-27 MED ORDER — CEFAZOLIN SODIUM-DEXTROSE 2-4 GM/100ML-% IV SOLN
2.0000 g | Freq: Once | INTRAVENOUS | Status: AC
  Administered 2021-12-27: 2 g via INTRAVENOUS

## 2021-12-27 MED ORDER — ONDANSETRON HCL 4 MG/2ML IJ SOLN
INTRAMUSCULAR | Status: AC
  Filled 2021-12-27: qty 2

## 2021-12-27 MED ORDER — FENTANYL CITRATE (PF) 100 MCG/2ML IJ SOLN
INTRAMUSCULAR | Status: DC | PRN
  Administered 2021-12-27: 50 ug via INTRAVENOUS
  Administered 2021-12-27: 100 ug via INTRAVENOUS

## 2021-12-27 MED ORDER — PROPOFOL 10 MG/ML IV BOLUS
INTRAVENOUS | Status: AC
  Filled 2021-12-27: qty 20

## 2021-12-27 MED ORDER — MIDAZOLAM HCL 5 MG/5ML IJ SOLN
INTRAMUSCULAR | Status: DC | PRN
  Administered 2021-12-27: 2 mg via INTRAVENOUS

## 2021-12-27 MED ORDER — DEXAMETHASONE SODIUM PHOSPHATE 10 MG/ML IJ SOLN
INTRAMUSCULAR | Status: AC
  Filled 2021-12-27: qty 1

## 2021-12-27 MED ORDER — SUGAMMADEX SODIUM 200 MG/2ML IV SOLN
INTRAVENOUS | Status: DC | PRN
  Administered 2021-12-27: 200 mg via INTRAVENOUS

## 2021-12-27 MED ORDER — DIPHENHYDRAMINE HCL 12.5 MG/5ML PO ELIX: 12.5000 mg | ORAL_SOLUTION | Freq: Four times a day (QID) | ORAL | Status: DC | PRN

## 2021-12-27 MED ORDER — LIDOCAINE HCL (PF) 2 % IJ SOLN
INTRAMUSCULAR | Status: AC
  Filled 2021-12-27: qty 5

## 2021-12-27 MED ORDER — MIDAZOLAM HCL 2 MG/2ML IJ SOLN
INTRAMUSCULAR | Status: AC
  Filled 2021-12-27: qty 2

## 2021-12-27 MED ORDER — OXYCODONE HCL 5 MG PO TABS: 5.0000 mg | ORAL_TABLET | Freq: Four times a day (QID) | ORAL | Status: DC | PRN

## 2021-12-27 MED ORDER — ACETAMINOPHEN 10 MG/ML IV SOLN: 1000.0000 mg | Freq: Once | INTRAVENOUS | Status: DC | PRN

## 2021-12-27 MED ORDER — AMISULPRIDE (ANTIEMETIC) 5 MG/2ML IV SOLN: 10.0000 mg | Freq: Once | INTRAVENOUS | Status: DC | PRN

## 2021-12-27 MED ORDER — CEFAZOLIN SODIUM 1 G IJ SOLR
INTRAMUSCULAR | Status: AC
  Filled 2021-12-27: qty 20

## 2021-12-27 MED ORDER — DEXAMETHASONE SODIUM PHOSPHATE 10 MG/ML IJ SOLN
INTRAMUSCULAR | Status: DC | PRN
  Administered 2021-12-27: 10 mg via INTRAVENOUS

## 2021-12-27 MED ORDER — NALOXONE HCL 0.4 MG/ML IJ SOLN: 0.4000 mg | INTRAMUSCULAR | Status: DC | PRN

## 2021-12-27 MED ORDER — ROCURONIUM BROMIDE 10 MG/ML (PF) SYRINGE
PREFILLED_SYRINGE | INTRAVENOUS | Status: AC
  Filled 2021-12-27: qty 10

## 2021-12-27 MED ORDER — DIPHENHYDRAMINE HCL 50 MG/ML IJ SOLN: 12.5000 mg | Freq: Four times a day (QID) | INTRAMUSCULAR | Status: DC | PRN

## 2021-12-27 MED ORDER — HYDROMORPHONE HCL 1 MG/ML IJ SOLN: 0.5000 mg | INTRAMUSCULAR | Status: DC | PRN

## 2021-12-27 MED ORDER — GABAPENTIN 100 MG PO CAPS: 200.0000 mg | ORAL_CAPSULE | Freq: Three times a day (TID) | ORAL | Status: DC

## 2021-12-27 MED ORDER — SODIUM CHLORIDE 0.9% FLUSH: 9.0000 mL | INTRAVENOUS | Status: DC | PRN

## 2021-12-27 MED ORDER — GABAPENTIN 100 MG PO CAPS
100.0000 mg | ORAL_CAPSULE | Freq: Three times a day (TID) | ORAL | Status: DC
  Administered 2021-12-27 (×2): 100 mg via ORAL
  Filled 2021-12-27 (×2): qty 1

## 2021-12-27 MED ORDER — LIDOCAINE 2% (20 MG/ML) 5 ML SYRINGE
INTRAMUSCULAR | Status: DC | PRN
  Administered 2021-12-27: 100 mg via INTRAVENOUS

## 2021-12-27 MED ORDER — HYDROMORPHONE HCL 1 MG/ML IJ SOLN: 0.2500 mg | INTRAMUSCULAR | Status: DC | PRN

## 2021-12-27 MED ORDER — ACETAMINOPHEN 10 MG/ML IV SOLN
INTRAVENOUS | Status: AC
  Filled 2021-12-27: qty 100

## 2021-12-27 MED ORDER — PROPOFOL 10 MG/ML IV BOLUS
INTRAVENOUS | Status: DC | PRN
  Administered 2021-12-27: 40 mg via INTRAVENOUS
  Administered 2021-12-27 (×2): 20 mg via INTRAVENOUS
  Administered 2021-12-27: 200 mg via INTRAVENOUS
  Administered 2021-12-27: 20 mg via INTRAVENOUS

## 2021-12-27 MED ORDER — SUCCINYLCHOLINE CHLORIDE 200 MG/10ML IV SOSY
PREFILLED_SYRINGE | INTRAVENOUS | Status: DC | PRN
  Administered 2021-12-27: 160 mg via INTRAVENOUS

## 2021-12-27 MED ORDER — MORPHINE SULFATE 1 MG/ML IV SOLN PCA
INTRAVENOUS | Status: DC
  Administered 2021-12-28: 6.59 mg via INTRAVENOUS
  Administered 2021-12-28: 3 mg via INTRAVENOUS
  Administered 2021-12-28: 13 mg via INTRAVENOUS
  Administered 2021-12-28: 5 mg via INTRAVENOUS
  Administered 2021-12-29: 3 mg via INTRAVENOUS
  Administered 2021-12-29: 6 mg via INTRAVENOUS
  Filled 2021-12-27 (×4): qty 30

## 2021-12-27 MED ORDER — PHENYLEPHRINE 80 MCG/ML (10ML) SYRINGE FOR IV PUSH (FOR BLOOD PRESSURE SUPPORT)
PREFILLED_SYRINGE | INTRAVENOUS | Status: DC | PRN
  Administered 2021-12-27 (×2): 160 ug via INTRAVENOUS

## 2021-12-27 MED ORDER — ONDANSETRON HCL 4 MG/2ML IJ SOLN
INTRAMUSCULAR | Status: DC | PRN
  Administered 2021-12-27: 4 mg via INTRAVENOUS

## 2021-12-27 MED ORDER — DEXMEDETOMIDINE HCL IN NACL 80 MCG/20ML IV SOLN
INTRAVENOUS | Status: DC | PRN
  Administered 2021-12-27: 12 ug via BUCCAL

## 2021-12-27 MED ORDER — SODIUM CHLORIDE 0.9 % IV SOLN: INTRAVENOUS | Status: AC

## 2021-12-27 MED ORDER — LACTATED RINGERS IV SOLN: INTRAVENOUS | Status: DC | PRN

## 2021-12-27 MED ORDER — FENTANYL CITRATE (PF) 250 MCG/5ML IJ SOLN
INTRAMUSCULAR | Status: AC
  Filled 2021-12-27: qty 5

## 2021-12-27 MED ORDER — SUCCINYLCHOLINE CHLORIDE 200 MG/10ML IV SOSY
PREFILLED_SYRINGE | INTRAVENOUS | Status: AC
  Filled 2021-12-27: qty 10

## 2021-12-27 MED ORDER — SODIUM CHLORIDE 0.9 % IV SOLN
6.2500 mg | Freq: Four times a day (QID) | INTRAVENOUS | Status: DC | PRN
  Administered 2021-12-27 – 2022-01-14 (×10): 6.25 mg via INTRAVENOUS
  Filled 2021-12-27 (×42): qty 0.25

## 2021-12-27 SURGICAL SUPPLY — 20 items
BAG COUNTER SPONGE SURGICOUNT (BAG) IMPLANT
CHLORAPREP W/TINT 26 (MISCELLANEOUS) ×1 IMPLANT
DRAPE LAPAROSCOPIC ABDOMINAL (DRAPES) ×1 IMPLANT
ELECT REM PT RETURN 15FT ADLT (MISCELLANEOUS) ×1 IMPLANT
GLOVE BIO SURGEON STRL SZ7.5 (GLOVE) ×1 IMPLANT
GLOVE BIOGEL PI IND STRL 7.0 (GLOVE) ×1 IMPLANT
GOWN STRL REUS W/ TWL XL LVL3 (GOWN DISPOSABLE) ×2 IMPLANT
GOWN STRL REUS W/TWL XL LVL3 (GOWN DISPOSABLE) ×2
HANDLE SUCTION POOLE (INSTRUMENTS) IMPLANT
KIT BASIN OR (CUSTOM PROCEDURE TRAY) ×1 IMPLANT
KIT TURNOVER KIT A (KITS) IMPLANT
NS IRRIG 1000ML POUR BTL (IV SOLUTION) ×1 IMPLANT
PACK GENERAL/GYN (CUSTOM PROCEDURE TRAY) ×1 IMPLANT
SHEARS HARMONIC ACE PLUS 36CM (ENDOMECHANICALS) IMPLANT
STAPLER VISISTAT 35W (STAPLE) ×1 IMPLANT
SUCTION POOLE HANDLE (INSTRUMENTS) ×1
SUT NOVA 1 T20/GS 25DT (SUTURE) IMPLANT
SUT PDS AB 1 TP1 96 (SUTURE) IMPLANT
TOWEL OR 17X26 10 PK STRL BLUE (TOWEL DISPOSABLE) ×1 IMPLANT
TRAY FOLEY MTR SLVR 16FR STAT (SET/KITS/TRAYS/PACK) ×1 IMPLANT

## 2021-12-27 NOTE — Progress Notes (Signed)
       CROSS COVER NOTE  NAME: Nathaniel Hanson MRN: [id number] DOB : [birth year]    Date of Service   12/27/2021   HPI/Events of Note   Notified by RN for possible increased abdominal drainage .  Found to have oozing at lower aspect of surgical bandage. Currently administering antiemetic and anti-gas.  Surgery has been called by RN and is en route to hospital.      Interventions   Plan: Antiemetic Anti-gas Surgery is en route         Update:    Patient going to OR with Surgeon.     Chinita Greenland BSN MSNA MSN ACNPC-AG Acute Care Nurse Practitioner [facility]

## 2021-12-27 NOTE — Progress Notes (Signed)
Patient ID: Nathaniel Hanson, male   DOB: [birth year], 41 y.o.   MRN: [id number]  I was called to the bedside for bleeding from the patients incision.  There was actually fluid coming from multiple areas.  The patient had several episodes of vomiting.  I removed the penrose drain and about 4 or 5 staples and found the patient has a fascial dehiscence and near evisceration of his bowel.  I explained this to him.  He needs to go emergently to the OR for closure of his fascia.  I explained the risks which includes but is not limited to bleeding, infection, injury to surrounding structures, the need for further procedures, having an open wound, etc.  He agrees to proceed.  Surgery posted emergently

## 2021-12-27 NOTE — Progress Notes (Signed)
PROGRESS NOTE  Nathaniel Hanson WVP:[id number] DOB: 09-03-1980   PCP: Creola Corn, MD  Patient is from: Home.  DOA: 12/21/2021 LOS: 5  Chief complaints Chief Complaint  Patient presents with   Syncopal Episode     Brief Narrative / Interim history: 41 year old M with no significant past medical history presenting with what looks like a syncopal episode and found to have sigmoid volvulus in ED.  General surgery and GI consulted.  Patient underwent flex sigmoidoscopy on 12/6 but abdominal x-ray with persistent massive dilation of sigmoid colon.  Patient underwent sigmoid colectomy and mobilization of splenic flexure on 12/7.  Now with postop ileus.  General surgery following.  Syncope work-up including serial troponin, EKG, telemetry and echocardiogram unrevealing.  Syncope felt to be vasovagal in the setting of the above..   Subjective: Seen and examined earlier this morning.  No major events overnight this morning.  Reports 2 bowel movements overnight.  Seems like he defecated on himself last night.  Reports some nausea but no emesis.  Some abdominal pain.  Objective: Vitals:   12/26/21 2100 12/27/21 0500 12/27/21 0525 12/27/21 1330  BP: 126/82  124/70 120/83  Pulse: 92  82 78  Resp: 20  16 20   Temp: 99 F (37.2 C)  98 F (36.7 C) 98.2 F (36.8 C)  TempSrc: Oral  Oral   SpO2: 94%  95% 96%  Weight:  117.8 kg    Height:        Examination:  GENERAL: No apparent distress.  Nontoxic. HEENT: MMM.  Vision and hearing grossly intact.  NECK: Supple.  No apparent JVD.  RESP:  No IWOB.  Fair aeration bilaterally. CVS:  RRR. Heart sounds normal.  ABD/GI/GU: BS+.  Abdomen distended.  Nontender.  Honeycomb dressing.  Small blister to left of dressing.  Penrose hose in place MSK/EXT:  Moves extremities. No apparent deformity. No edema.  SKIN: no apparent skin lesion or wound NEURO: Awake and alert. Oriented appropriately.  No apparent focal neuro deficit. PSYCH: Calm. Normal affect.    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  Postoperative ileus-abdomen distended but 2 BMs and flatus overnight.  Nausea but no emesis.  KUB suggests ileus. -S/p flex sigmoidoscopy without success on 12/6 -S/p sigmoid colectomy in 12/7 -Follow surgical path -Discontinued Robaxin and decreased opiate -Ambulation every 4 hours while awake and OOB for meals.   Vasovagal syncope: Work-up including serial troponin, EKG, telemetry and echocardiogram reassuring.  No significant cardiopulmonary finding on CT chest.  No significant personal or family history of cardiac disease.  No exertional dyspnea, syncope or chest pain.  Could be vasovagal from sigmoid volvulus.  -Continue telemetry monitoring  Sinus tachycardia: Likely from dehydration and pain.  Resolved.  Hyponatremia: Likely from dehydration.  Resolved.  Nocturnal cough: Due to allergy or/and/GERD?  Patient is not a great historian.  CT C/A/P without acute finding other than sigmoid volvulus. -Encouraged lifestyle change.  Not interested in medications  Obesity Body mass index is 34.26 kg/m. -Encouraged lifestyle change.          DVT prophylaxis:  enoxaparin (LOVENOX) injection 40 mg Start: 12/24/21 1000 SCDs Start: 12/22/21 0028  Code Status: Full code Family Communication: None at bedside Level of care: Telemetry Status is: Inpatient The patient will  remain inpatient because: Sigmoid volvulus and postop ileus   Final disposition: Home Consultants:  General surgery Gastroenterology  Sch Meds:  Scheduled Meds:  acetaminophen  1,000 mg Oral Q6H   docusate sodium  100 mg Oral BID   enoxaparin (LOVENOX) injection   40 mg Subcutaneous Q24H   gabapentin  100 mg Oral TID   sodium chloride flush  3 mL Intravenous Q12H   Continuous Infusions:  sodium chloride 100 mL/hr at 12/27/21 0024   promethazine (PHENERGAN) injection (IM or IVPB)     PRN Meds:.  Antimicrobials: Anti-infectives (From admission, onward)    Start     Dose/Rate Route Frequency Ordered Stop   12/23/21 1400  cefoTEtan (CEFOTAN) 2 g in sodium chloride 0.9 % 100 mL IVPB        2 g 200 mL/hr over 30 Minutes Intravenous 60 min pre-op 12/23/21 1040 12/23/21 1444        I have personally reviewed the following labs and images: CBC: Recent Labs  Lab 12/21/21 0029 12/23/21 0610 12/24/21 0609 12/25/21 0625 12/27/21 0511  WBC 9.2 5.9 10.2 11.1* 10.5  HGB 14.7 14.7 15.9 14.2 14.1  HCT 43.9 44.3 47.8 43.1 42.6  MCV 83.8 86.0 83.7 85.3 85.2  PLT 252 239 265 263 315   BMP &GFR Recent Labs  Lab 12/21/21 0029 12/23/21 0610 12/24/21 0609 12/25/21 0625 12/27/21 0511  NA 138 138 132* 136 138  K 3.7 4.2 4.5 4.4 3.7  CL 109 105 99 104 106  CO2 22 25 23 25 25   GLUCOSE 96 89 113* 125* 121*  BUN 15 11 10 10 16   CREATININE 0.97 0.97 1.14 0.97 0.93  CALCIUM 9.0 8.8* 8.5* 8.5* 8.5*  MG  --  2.0  --  1.9 2.0  PHOS  --   --   --  2.9 2.6   Estimated Creatinine Clearance: 142 mL/min (by C-G formula based on SCr of 0.93 mg/dL). Liver & Pancreas: Recent Labs  Lab 12/23/21 0610 12/25/21 0625 12/27/21 0511  AST 30 33 18  ALT 34 27 18  ALKPHOS 79 64 60  BILITOT 1.7* 1.1 0.9  PROT 6.6 6.2* 6.1*  ALBUMIN 3.4* 3.1* 2.8*   No results for input(s): "LIPASE", "AMYLASE" in the last 168 hours. No results for input(s): "AMMONIA" in the last 168 hours. Diabetic: No results for input(s): "HGBA1C" in the last 72 hours. Recent Labs  Lab 12/23/21 0717 12/24/21 0610 12/25/21 0438 12/26/21 0750 12/27/21 0522  GLUCAP 80 120* 133* 110* 122*   Cardiac Enzymes: No results for input(s): "CKTOTAL", "CKMB", "CKMBINDEX", "TROPONINI" in the  last 168 hours. No results for input(s): "PROBNP" in the last 8760 hours. Coagulation Profile: No results for input(s): "INR", "PROTIME" in the last 168 hours. Thyroid Function Tests: Recent Labs    12/27/21 0510  TSH 1.651    Lipid Profile: No results for input(s): "CHOL", "HDL", "LDLCALC", "TRIG", "CHOLHDL", "LDLDIRECT" in the last 72 hours. Anemia Panel: No results for input(s): "VITAMINB12", "FOLATE", "FERRITIN", "TIBC", "IRON", "RETICCTPCT" in the last 72 hours. Urine analysis:    Component Value Date/Time   COLORURINE YELLOW 12/21/2021 0631   APPEARANCEUR CLEAR 12/21/2021 0631   LABSPEC >1.046 (H) 12/21/2021 0631   PHURINE 5.0 12/21/2021 0631   GLUCOSEU NEGATIVE 12/21/2021 0631   HGBUR NEGATIVE 12/21/2021 0631   BILIRUBINUR NEGATIVE 12/21/2021 0631   KETONESUR NEGATIVE 12/21/2021 0631   PROTEINUR NEGATIVE 12/21/2021 0631   NITRITE NEGATIVE 12/21/2021 0631   LEUKOCYTESUR NEGATIVE 12/21/2021 0631   Sepsis  Labs: Invalid input(s): "PROCALCITONIN", "LACTICIDVEN"  Microbiology: Recent Results (from the past 240 hour(s))  Resp Panel by RT-PCR (Flu A&B, Covid) Anterior Nasal Swab     Status: None   Collection Time: 12/21/21 12:29 AM   Specimen: Anterior Nasal Swab  Result Value Ref Range Status   SARS Coronavirus 2 by RT PCR NEGATIVE NEGATIVE Final    Comment: (NOTE) SARS-CoV-2 target nucleic acids are NOT DETECTED.  The SARS-CoV-2 RNA is generally detectable in upper respiratory specimens during the acute phase of infection. The lowest concentration of SARS-CoV-2 viral copies this assay can detect is 138 copies/mL. A negative result does not preclude SARS-Cov-2 infection and should not be used as the sole basis for treatment or other patient management decisions. A negative result may occur with  improper specimen collection/handling, submission of specimen other than nasopharyngeal swab, presence of viral mutation(s) within the areas targeted by this assay, and  inadequate number of viral copies(<138 copies/mL). A negative result must be combined with clinical observations, patient history, and epidemiological information. The expected result is Negative.  Fact Sheet for Patients:  BloggerCourse.com  Fact Sheet for Healthcare Providers:  SeriousBroker.it  This test is no t yet approved or cleared by the [place] FDA and  has been authorized for detection and/or diagnosis of SARS-CoV-2 by FDA under an Emergency Use Authorization (EUA). This EUA will remain  in effect (meaning this test can be used) for the duration of the COVID-19 declaration under Section 564(b)(1) of the Act, 21 U.S.C.section 360bbb-3(b)(1), unless the authorization is terminated  or revoked sooner.       Influenza A by PCR NEGATIVE NEGATIVE Final   Influenza B by PCR NEGATIVE NEGATIVE Final    Comment: (NOTE) The Xpert Xpress SARS-CoV-2/FLU/RSV plus assay is intended as an aid in the diagnosis of influenza from Nasopharyngeal swab specimens and should not be used as a sole basis for treatment. Nasal washings and aspirates are unacceptable for Xpert Xpress SARS-CoV-2/FLU/RSV testing.  Fact Sheet for Patients: BloggerCourse.com  Fact Sheet for Healthcare Providers: SeriousBroker.it  This test is not yet approved or cleared by the [place] FDA and has been authorized for detection and/or diagnosis of SARS-CoV-2 by FDA under an Emergency Use Authorization (EUA). This EUA will remain in effect (meaning this test can be used) for the duration of the COVID-19 declaration under Section 564(b)(1) of the Act, 21 U.S.C. section 360bbb-3(b)(1), unless the authorization is terminated or revoked.  Performed at [facility], [address], [place], [place] [address]     Radiology Studies: DG Abd Portable 1V  Result Date: 12/27/2021 CLINICAL  DATA:  Ileus following gastrointestinal surgery (HCC) EXAM: PORTABLE ABDOMEN - 1 VIEW COMPARISON:  Radiograph 12/22/2021, CT 12/21/2021 FINDINGS: Recent midline skin staples. There is diffuse gaseous distention of bowel. IMPRESSION: Diffuse gaseous distention of bowel, compatible with postoperative ileus. Electronically Signed   By: Caprice Renshaw M.D.   On: 12/27/2021 11:18      Kae Lauman T. Luz Mares [facility]  If 7PM-7AM, please contact night-coverage [email or web address] 12/27/2021, 4:19 PM

## 2021-12-27 NOTE — Anesthesia Preprocedure Evaluation (Signed)
Anesthesia Evaluation  Patient identified by MRN, date of birth, ID band Patient awake    Reviewed: Allergy & Precautions, NPO status , Patient's Chart, lab work & pertinent test results  History of Anesthesia Complications Negative for: history of anesthetic complications  Airway Mallampati: II  TM Distance: >3 FB Neck ROM: Full    Dental  (+) Dental Advisory Given, Teeth Intact   Pulmonary neg pulmonary ROS   Pulmonary exam normal        Cardiovascular negative cardio ROS Normal cardiovascular exam  Echo 12/22/2021  1. Left ventricular ejection fraction, by estimation, is 55%. The left ventricle has normal function. The left ventricle has no regional wall motion abnormalities. Left ventricular diastolic parameters were normal.   2. Right ventricular systolic function is normal. The right ventricular size is normal. Tricuspid regurgitation signal is inadequate for assessing PA pressure.   3. The mitral valve is normal in structure. Trivial mitral valve regurgitation. No evidence of mitral stenosis.   4. The aortic valve is tricuspid. Aortic valve regurgitation is not visualized. No aortic stenosis is present.   5. IVC not visualized.     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  Reproductive/Obstetrics                              Anesthesia Physical Anesthesia Plan  ASA: 2 and emergent  Anesthesia Plan: General   Post-op Pain Management: Ofirmev IV (intra-op)*, Precedex and Dilaudid IV   Induction:  Intravenous, Rapid sequence and Cricoid pressure planned  PONV Risk Score and Plan: 4 or greater and Treatment may vary due to age or medical condition, Ondansetron, Dexamethasone and Midazolam  Airway Management Planned: Oral ETT  Additional Equipment: None  Intra-op Plan:   Post-operative Plan: Extubation in OR  Informed Consent: I have reviewed the patients History and Physical, chart, labs and discussed the procedure including the risks, benefits and alternatives for the proposed anesthesia with the patient or authorized representative who has indicated his/her understanding and acceptance.     Dental advisory given  Plan Discussed with: CRNA  Anesthesia Plan Comments:         Anesthesia Quick Evaluation

## 2021-12-27 NOTE — Anesthesia Procedure Notes (Signed)
Procedure Name: Intubation Date/Time: 12/27/2021 9:18 PM  Performed by: Gerald Leitz, CRNAPre-anesthesia Checklist: Patient identified, Patient being monitored, Timeout performed, Emergency Drugs available and Suction available Patient Re-evaluated:Patient Re-evaluated prior to induction Oxygen Delivery Method: Circle system utilized Preoxygenation: Pre-oxygenation with 100% oxygen Induction Type: IV induction and Rapid sequence Ventilation: Mask ventilation without difficulty Laryngoscope Size: Mac and 3 Grade View: Grade I Tube type: Oral Tube size: 7.5 mm Number of attempts: 1 Airway Equipment and Method: Stylet Placement Confirmation: ETT inserted through vocal cords under direct vision, positive ETCO2 and breath sounds checked- equal and bilateral Secured at: 21 cm Tube secured with: Tape Dental Injury: Teeth and Oropharynx as per pre-operative assessment

## 2021-12-27 NOTE — Progress Notes (Signed)
Pt had BM in bed, did not call for help to bathroom. While cleaning him up I asked about feelings of depression and his willingness to get get better. Explained to him that not getting up to use the bathroom when he is able to at baseline is a decline and would keep him here longer. Pt stated" I'm not allowed to get up" this RN explained he is allowed to get up, he just needs to call for help so can ensure his safety. Asked about profession. 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 " Me" Unice Cobble great, I bet they miss, and need you now since its the season right?" Pt," Yes" Me" Did PT come and work with you today?" PT " yes, but I told the I felt too weak", me" well what will help build your strength?" " Getting up and walking" " exactly, you got this but you have to keep trying" Pt also admitted to taking antidepressants in the past.  Offered to have chaplin speak to him pt declined. Pt did call for help next time he had to use the bathroom.

## 2021-12-27 NOTE — Transfer of Care (Signed)
Immediate Anesthesia Transfer of Care Note  Patient: Nathaniel Hanson  Procedure(s) Performed: Procedure(s): EXPLORATORY LAPAROTOMY, WOUND CLOSURE (N/A)  Patient Location: PACU  Anesthesia Type:General  Level of Consciousness: Alert, Awake, Oriented  Airway & Oxygen Therapy: Patient Spontanous Breathing  Post-op Assessment: Report given to RN  Post vital signs: Reviewed and stable  Last Vitals:  Vitals:   12/27/21 2107 12/27/21 2230  BP: (!) 138/96 (!) 146/89  Pulse: 92   Resp:  (!) 32  Temp: 36.6 C 36.7 C  SpO2: 93%     Complications: No apparent anesthesia complications

## 2021-12-27 NOTE — Op Note (Signed)
EXPLORATORY LAPAROTOMY  Procedure Note  Nathaniel Hanson 12/21/2021 - 12/27/2021   Pre-op Diagnosis: fascial dehiscence     Post-op Diagnosis: same  Procedure(s): EXPLORATORY LAPAROTOMY  Surgeon(s): Abigail Miyamoto, MD  Anesthesia: General  Staff:  Circulator: Dominga Ferry, RN Scrub Person: Donald Pore, CST  Estimated Blood Loss: Minimal               Indications: This is a 41 year old gentleman who had undergone exploratory laparotomy and partial colectomy on December 7.  Late this afternoon toward the evening he started having multiple episodes of nausea and vomiting.  He was reported to have bleeding from his wound.  I came in to evaluate the patient and he was found to have diffuse oozing which seemed more like peritoneal fluid.  I removed several staples at the lower aspect of his incision and removed the Penrose drain and found that he had dehisced his fascia and there was exposed bowel.  The decision was made to proceed emergently to the operating room  Findings: The patient was found to have almost complete disruption of his midline sutures with only approximately a third of the fascia remaining closed.  I reapproximated the fascia with 2 separate #1 looped PDS sutures and #1 Novafil internal retention sutures  Procedure: The patient was brought to the operating room and identified the correct patient.  He was placed upon the operating table and general anesthesia was induced.  His abdomen was prepped and draped in usual sterile fashion.  I removed all the skin staples and then opened up the skin incision.  I found that his original PDS suture had broken and the fascia was disrupted completely on was two thirds of the incision.  I removed the suture.  I evaluated the small bowel and the anastomosis which appeared intact.  I then irrigated the abdomen with a liter of saline.  A nasogastric tube had been placed and I confirmed placement in the stomach.  I then  reapproximated the patient's midline fascia with 2 separate #1 PDS sutures and every third throw placed #1 Novafil internal retention suture in the fascia.  The fascia appeared intact and appeared healthy.  I completed the closure tying the 2 separate #1 PDS sutures together completely closing the fascia.  We then irrigated the midline incision with saline.  Hemostasis was achieved with the cautery.  I then left the skin open and packed it with a wet-to-dry saline soaked Kerlix and then cover this with dry gauze and ABD and tape.  The patient tolerated the procedure well.  All the counts were correct at the end of the procedure.  He was then extubated in the operating room and taken in a stable condition to the recovery room.          Abigail Miyamoto   Date: 12/27/2021  Time: 10:19 PM

## 2021-12-27 NOTE — Progress Notes (Signed)
4 Days Post-Op   Subjective/Chief Complaint: Passing flatus and has had 2 bowel movements. Tolerating clears overall but does complain of persistent nausea seemingly unrelated to PO intake. States he feels weak   Objective: Vital signs in last 24 hours: Temp:  [97.8 F (36.6 C)-99 F (37.2 C)] 98 F (36.7 C) (12/11 0525) Pulse Rate:  [82-92] 82 (12/11 0525) Resp:  [16-20] 16 (12/11 0525) BP: (124-150)/(70-89) 124/70 (12/11 0525) SpO2:  [94 %-96 %] 95 % (12/11 0525) Weight:  [117.8 kg] 117.8 kg (12/11 0500) Last BM Date : 12/23/21  Intake/Output from previous day: 12/10 0701 - 12/11 0700 In: 310 [P.O.:310] Out: 350 [Urine:350] Intake/Output this shift: No intake/output data recorded.  Abd - moderate distension. Soft. Appropriate TTP over incision. Dressing removed and incision with staples intact and penrose in distal wound draining scan serous fluid. No erythema or purulent drainage  Lab Results:  Recent Labs    12/25/21 0625 12/27/21 0511  WBC 11.1* 10.5  HGB 14.2 14.1  HCT 43.1 42.6  PLT 263 315    BMET Recent Labs    12/25/21 0625 12/27/21 0511  NA 136 138  K 4.4 3.7  CL 104 106  CO2 25 25  GLUCOSE 125* 121*  BUN 10 16  CREATININE 0.97 0.93  CALCIUM 8.5* 8.5*    PT/INR No results for input(s): "LABPROT", "INR" in the last 72 hours. ABG No results for input(s): "PHART", "HCO3" in the last 72 hours.  Invalid input(s): "PCO2", "PO2"  Studies/Results: No results found.  Anti-infectives: Anti-infectives (From admission, onward)    Start     Dose/Rate Route Frequency Ordered Stop   12/23/21 1400  cefoTEtan (CEFOTAN) 2 g in sodium chloride 0.9 % 100 mL IVPB        2 g 200 mL/hr over 30 Minutes Intravenous 60 min pre-op 12/23/21 1040 12/23/21 1444       Assessment/Plan: Sigmoid volvulus POD 4 s/p sigmoid colectomy, mobilization of splenic flexure Dr. 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 Advance to FLD -  dc Reglan, continue prn zofran and add phenergan for breakthrough; PRN simethicone - encourage mobilization and IS use - restart maintenance IV fluids until taking better PO's   FEN: FLD ID: cefotetan periop VTE: lovenox   LOS: 5 days    Eric Form, [facility] 12/27/2021, 9:38 AM Please see Amion for pager number during day hours 7:00am-4:30pm

## 2021-12-28 ENCOUNTER — Encounter (HOSPITAL_COMMUNITY): Payer: Self-pay | Admitting: Surgery

## 2021-12-28 ENCOUNTER — Inpatient Hospital Stay: Payer: Self-pay

## 2021-12-28 LAB — CBC
HCT: 42 % (ref 39.0–52.0)
Hemoglobin: 14 g/dL (ref 13.0–17.0)
MCH: 28.5 pg (ref 26.0–34.0)
MCHC: 33.3 g/dL (ref 30.0–36.0)
MCV: 85.4 fL (ref 80.0–100.0)
Platelets: 330 10*3/uL (ref 150–400)
RBC: 4.92 MIL/uL (ref 4.22–5.81)
RDW: 13.8 % (ref 11.5–15.5)
WBC: 10.4 10*3/uL (ref 4.0–10.5)
nRBC: 0 % (ref 0.0–0.2)

## 2021-12-28 LAB — MAGNESIUM: Magnesium: 1.8 mg/dL (ref 1.7–2.4)

## 2021-12-28 LAB — RENAL FUNCTION PANEL
Albumin: 2.6 g/dL — ABNORMAL LOW (ref 3.5–5.0)
Anion gap: 6 (ref 5–15)
BUN: 16 mg/dL (ref 6–20)
CO2: 30 mmol/L (ref 22–32)
Calcium: 8.3 mg/dL — ABNORMAL LOW (ref 8.9–10.3)
Chloride: 105 mmol/L (ref 98–111)
Creatinine, Ser: 0.94 mg/dL (ref 0.61–1.24)
GFR, Estimated: >60 mL/min (ref >60)
Glucose, Bld: 124 mg/dL — ABNORMAL HIGH (ref 70–99)
Phosphorus: 4.3 mg/dL (ref 2.5–4.6)
Potassium: 4.6 mmol/L (ref 3.5–5.1)
Sodium: 141 mmol/L (ref 135–145)

## 2021-12-28 LAB — GLUCOSE, CAPILLARY
Glucose-Capillary: 104 mg/dL — ABNORMAL HIGH (ref 70–99)
Glucose-Capillary: 104 mg/dL — ABNORMAL HIGH (ref 70–99)
Glucose-Capillary: 120 mg/dL — ABNORMAL HIGH (ref 70–99)
Glucose-Capillary: 133 mg/dL — ABNORMAL HIGH (ref 70–99)

## 2021-12-28 MED ORDER — TRAVASOL 10 % IV SOLN
INTRAVENOUS | Status: AC
  Filled 2021-12-28: qty 576

## 2021-12-28 MED ORDER — METHOCARBAMOL 1000 MG/10ML IJ SOLN
500.0000 mg | Freq: Four times a day (QID) | INTRAVENOUS | Status: DC | PRN
  Administered 2022-01-14: 500 mg via INTRAVENOUS
  Filled 2021-12-28: qty 500

## 2021-12-28 MED ORDER — ACETAMINOPHEN 10 MG/ML IV SOLN
1000.0000 mg | Freq: Four times a day (QID) | INTRAVENOUS | Status: AC
  Administered 2021-12-28 – 2021-12-29 (×4): 1000 mg via INTRAVENOUS
  Filled 2021-12-28 (×4): qty 100

## 2021-12-28 MED ORDER — SODIUM CHLORIDE 0.9% FLUSH
10.0000 mL | INTRAVENOUS | Status: DC | PRN
  Administered 2022-01-05 – 2022-01-18 (×2): 10 mL

## 2021-12-28 MED ORDER — SODIUM CHLORIDE 0.9 % IV SOLN: INTRAVENOUS | Status: DC

## 2021-12-28 MED ORDER — INSULIN ASPART 100 UNIT/ML IJ SOLN
0.0000 [IU] | Freq: Four times a day (QID) | INTRAMUSCULAR | Status: DC
  Administered 2022-01-01: 2 [IU] via SUBCUTANEOUS
  Administered 2022-01-02 – 2022-01-03 (×6): 1 [IU] via SUBCUTANEOUS
  Administered 2022-01-03 (×2): 2 [IU] via SUBCUTANEOUS
  Administered 2022-01-03: 1 [IU] via SUBCUTANEOUS
  Administered 2022-01-04 (×2): 2 [IU] via SUBCUTANEOUS
  Administered 2022-01-04: 1 [IU] via SUBCUTANEOUS
  Administered 2022-01-05: 2 [IU] via SUBCUTANEOUS
  Administered 2022-01-05: 1 [IU] via SUBCUTANEOUS

## 2021-12-28 MED ORDER — PANTOPRAZOLE SODIUM 40 MG IV SOLR
40.0000 mg | INTRAVENOUS | Status: DC
  Administered 2021-12-28 – 2022-01-10 (×14): 40 mg via INTRAVENOUS
  Filled 2021-12-28 (×14): qty 10

## 2021-12-28 MED ORDER — CHLORHEXIDINE GLUCONATE CLOTH 2 % EX PADS
6.0000 | MEDICATED_PAD | Freq: Every day | CUTANEOUS | Status: DC
  Administered 2021-12-28 – 2022-01-21 (×24): 6 via TOPICAL

## 2021-12-28 MED ORDER — SODIUM CHLORIDE 0.9% FLUSH
10.0000 mL | Freq: Two times a day (BID) | INTRAVENOUS | Status: DC
  Administered 2021-12-28: 30 mL
  Administered 2021-12-31 – 2022-01-02 (×4): 10 mL
  Administered 2022-01-03: 40 mL
  Administered 2022-01-12 – 2022-01-19 (×6): 10 mL

## 2021-12-28 MED ORDER — MAGNESIUM SULFATE IN D5W 1-5 GM/100ML-% IV SOLN
1.0000 g | Freq: Once | INTRAVENOUS | Status: AC
  Administered 2021-12-28: 1 g via INTRAVENOUS
  Filled 2021-12-28: qty 100

## 2021-12-28 NOTE — Progress Notes (Signed)
Around 1930 this RN notified by Nurse tech pt was bleeding after ambulated to bathroom. Went to assist pt vomited and there was blood coming out of bottom and penrose. This RN then places gauze with pressure, quickly soaked, went ot get abd pads and asked for assist by day shift [employer]. She then applied pressure with abd, we msged surgeon. Then msgd on call Garner Nash and Leisure centre manager. Dr. Magnus Ivan called this RN advised to place tight dressing he would be on the way asap.this RN back explained situation. Rapid RN and on call at bedside advise admin of thrombi bandage but to wait once aware DR. Otw. Administration of Reglan and  mylicon.  This RN provided all advisement. DR. At bedside as RN finished IV admins. Pt the vomited and blood/ fluid  excreted from staples. See Dr. Hattie Perch.

## 2021-12-28 NOTE — Progress Notes (Signed)
Initial Nutrition Assessment  DOCUMENTATION CODES:   Not applicable  INTERVENTION:  - Per pharmacy, TPN to start tonight at 4mL/hr which will provide: ~58g protein, 144g CHO, and 240 lipid calories (25% of total kcals) = 960 calories   - TPN management per pharmacy.   - Monitor magnesium, potassium, and phosphorus BID for at least 3 days, MD to replete as needed, as pt is at risk for refeeding syndrome given minimal intake x1 week. - Recommend 100mg  thiamine x5 days in TPN to aid in macronutrient metabolism.   - Monitor weights daily while on TPN.   NUTRITION DIAGNOSIS:   Inadequate oral intake related to altered GI function (ileus) as evidenced by NPO status, other (comment) (starting TPN).  GOAL:   Patient will meet greater than or equal to 90% of their needs  MONITOR:   Labs, Weight trends, Diet advancement  REASON FOR ASSESSMENT:   Consult New TPN/TNA  ASSESSMENT:   41 y.o. 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   12/5 Admit 12/6 flex sigmoidoscopy 12/7 sigmoid colectomy and mobilization of splenic flexure; started on clear liquids 12/11 started on full liquids but had N/V in afternoon so was taken for ex-lap; NG placed for LIS 12/12 likely ileus; PICC to be placed, starting TPN   Patient sitting in bedside chair at time of visit this AM. He reports a UBW of 254# and denied changes in weight within the past year. EMR confirms no significant changes in weight.  Endorses he tries to have 3 meals a day at home but admits he has a busy schedule as a 14/11. Shares she often has fast food for meals with examples provided being [employer], [employer], and [employer]. He does note his appetite is typically good but had decreased some the few days prior to presenting to hospital.  Patient has been on clear liquids for most of admission and noted to have had mostly 75-100% of meals. However, he did not  tolerate increase to full liquids yesterday and had nausea and vomiting. Plan for PICC to be placed today and to start TPN.  Discussed plan to start TPN and described what this entailed. Answered all questions.   Patient discussed with Pharmacist. Patient with possible risk of refeeding due to minimal intake (only on clear liquids) for 1 week. Plan to start at 67mL/hr which will provide 960 calories (8 kcals/kg) and ~58g of protein and monitor tolerance and electrolytes. Recommended adding thiamine to TPN to support macronutrient metabolism.     Medications reviewed and include: -  Labs reviewed:  -   NUTRITION - FOCUSED PHYSICAL EXAM:  Flowsheet Row Most Recent Value  Orbital Region No depletion  Upper Arm Region No depletion  Thoracic and Lumbar Region No depletion  Buccal Region No depletion  Temple Region No depletion  Clavicle Bone Region No depletion  Clavicle and Acromion Bone Region No depletion  Scapular Bone Region Unable to assess  Dorsal Hand No depletion  Patellar Region No depletion  Anterior Thigh Region No depletion  Posterior Calf Region No depletion  Edema (RD Assessment) None  Hair Reviewed  Eyes Reviewed  Mouth Reviewed  Skin Reviewed  Nails Reviewed       Diet Order:   Diet Order     None       EDUCATION NEEDS:  Education needs have been addressed  Skin:  Skin Assessment: Skin Integrity Issues: Skin Integrity Issues:: Incisions Incisions: Abdomen  Last BM:  12/11  Height:  Ht Readings from Last 1 Encounters:  12/23/21 6\' 1"  (1.854 m)   Weight:  Date/Time Weight Weight in lbs  12/28/21 05:33:19 119.4 kg 263.23 lbs  12/27/21 0500 117.8 kg 259.7 lbs  12/26/21 0400 116 kg 255.73 lbs  12/25/21 0500 116 kg 255.73 lbs  12/23/21 1315 116 kg 255.73 lbs  12/23/21 0500 116.2 kg 256.17 lbs  12/22/21 0819 115.2 kg 254 lbs  *Will utilize initial admission weight for calculations   Ideal Body Weight:  83.6 kg  BMI:  Body mass index is  33.52 kg/m.  Estimated Nutritional Needs:  Kcal:  2400-2750 kcals Protein:  140-170 grams Fluid:  >/= 2.1L    14/06/23 RD, LDN For contact information, refer to [facility].

## 2021-12-28 NOTE — Progress Notes (Signed)
1 Day Post-Op   Subjective/Chief Complaint: Slept well but starting to have some pain this am. No nausea with NGT. No flatus. Coughing a good bit this morning leading to increased abdominal pain   Objective: Vital signs in last 24 hours: Temp:  [97.9 F (36.6 C)-99.5 F (37.5 C)] 99.2 F (37.3 C) (12/12 0533) Pulse Rate:  [78-101] 96 (12/12 0533) Resp:  [16-32] 18 (12/12 0533) BP: (120-152)/(83-100) 120/84 (12/12 0533) SpO2:  [91 %-98 %] 97 % (12/12 0533) FiO2 (%):  [42 %] 42 % (12/12 0430) Weight:  [119.4 kg] 119.4 kg (12/12 0533) Last BM Date : (P) 12/27/21  Intake/Output from previous day: 12/11 0701 - 12/12 0700 In: 2543 [P.O.:1301; I.V.:1092; IV Piggyback:150] Out: 3425 [Urine:575; Emesis/NG output:1025; Blood:25] Intake/Output this shift: No intake/output data recorded.  Abd - moderate distension. Soft. Moderate TTP over incision without peritonitis. Dressing c/d/I without drainage this am - dressing change by me. Wound base intact with appropriate bleeding and no drainage. Blister from prior adhesive along left lateral margin covered with vaseline gauze. NGT with bilious drainage in tubing GU - foley draining clear yellow urine     Lab Results:  Recent Labs    12/27/21 0511 12/28/21 0459  WBC 10.5 10.4  HGB 14.1 14.0  HCT 42.6 42.0  PLT 315 330    BMET Recent Labs    12/27/21 0511 12/28/21 0459  NA 138 141  K 3.7 4.6  CL 106 105  CO2 25 30  GLUCOSE 121* 124*  BUN 16 16  CREATININE 0.93 0.94  CALCIUM 8.5* 8.3*    PT/INR No results for input(s): "LABPROT", "INR" in the last 72 hours. ABG No results for input(s): "PHART", "HCO3" in the last 72 hours.  Invalid input(s): "PCO2", "PO2"  Studies/Results: DG Abd Portable 1V  Result Date: 12/27/2021 CLINICAL DATA:  Ileus following gastrointestinal surgery (HCC) EXAM: PORTABLE ABDOMEN - 1 VIEW COMPARISON:  Radiograph 12/22/2021, CT 12/21/2021 FINDINGS: Recent midline skin staples. There is diffuse  gaseous distention of bowel. IMPRESSION: Diffuse gaseous distention of bowel, compatible with postoperative ileus. Electronically Signed   By: Caprice Renshaw M.D.   On: 12/27/2021 11:18    Anti-infectives: Anti-infectives (From admission, onward)    Start     Dose/Rate Route Frequency Ordered Stop   12/27/21 2130  ceFAZolin (ANCEF) IVPB 2g/100 mL premix        2 g 200 mL/hr over 30 Minutes Intravenous  Once 12/27/21 2039 12/27/21 2138   12/23/21 1400  cefoTEtan (CEFOTAN) 2 g in sodium chloride 0.9 % 100 mL IVPB        2 g 200 mL/hr over 30 Minutes Intravenous 60 min pre-op 12/23/21 1040 12/23/21 1444       Assessment/Plan: Sigmoid volvulus POD 5 s/p sigmoid colectomy, mobilization of splenic flexure Dr. Dossie Der 12/7 POD 1 s/p exploratory laparotomy placement of retention sutures for fascial dehiscence Dr. 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

## 2021-12-28 NOTE — Progress Notes (Signed)
PHARMACY - TOTAL PARENTERAL NUTRITION CONSULT NOTE   Indication: Prolonged ileus  Patient Measurements: Height: 6\' 1"  (185.4 cm) Weight: 119.4 kg (263 lb 3.7 oz) IBW/kg (Calculated) : 79.9 TPN AdjBW (KG): 88.9 Body mass index is 34.73 kg/m. Usual Weight:   Assessment: Patient is a 41 y.o M who presented to the ED on 12/21/21 for evaluation after he experienced syncope and blacked out at home.  Abdominal CT on 12/21/21 showed findings consistent with sigmoid volvulus.He underwent sigmoidoscopy on 12/22/21 with decompression of the volvulus. However, he still had significant sigmoid colon dilation s/p procedure and subsequently underwent sigmoid colectomy on 12/23/21. On 12/11, he had bleeding from surgical incision site as well as n/v and was found to have "fascial dehiscence and near evisceration of his bowel."  He was taken to the OR emergently on 12/27/21 for  exp lap with irrigation of the midline incision.  Abd x-ray on 12/27/21 showed findings consistent with post-op ileus. Pharmacy has been consulted to start TPN.   Glucose / Insulin: no hx DM; not on insulin - goal cbgs <150: wnl Electrolytes: magnesium 1.8; all other lytes wnl - For ileus, goal for Mag >2, K > 4, phos ~3 Renal: scr stable at 0.94 (crcl~100) Hepatic:  - LFTs wnl with labs on 12/11 - albumin low 2.6 Intake / Output; MIVF: NS at 125 ml/hr - I/O: - 882 mL - NG output: 1025 mL - UOP: 575 mL GI Imaging: - 12/5 abd CT: Findings compatible with sigmoid volvulus. - 12/11 abd xray: Diffuse gaseous distention of bowel, compatible with postoperative ileus. GI Surgeries / Procedures:  - 12/6 Flexible Sigmoidoscopy: Volvulus. Successful complete decompression achieved - 12/7: sigmoid colectomy  - 12/11: exp lap with irrigation of midline incision  Central access: pending PICC placement  TPN start date: 12/28/21 pending PICC placement  Nutritional Goals: Goal TPN rate is 100 mL/hr (provides 144 g of protein and 2400  kcals per day)  - 12/6: started on CLD  RD Assessment: On 12/12, RD recom (via [place] msg):  - Total Kcal: 2400-2750   - Protein: 140-170 grams  - Fluid: 2100-2500 mL  - 14/12, RD indicated that patient is at risk for refeeding syndrome and recom thiamine 100 mg for at least 5 days.     Current Nutrition:  Clear liquids  - To start TPN on 12/28/21  Plan:   - Now: magnesium sulfate 1 gm IV x1  At 1800: - Start TPN at 40 mL/hr at 1800 - Electrolytes in TPN:  Na 98mEq/L  K 57mEq/L Ca 34mEq/L  Mg 85mEq/L Phos 89mmol/L Cl:Ac 1:1 - Add standard MVI and trace elements to TPN - Add thiamine 100 mg to TPN (start on 12/12 - day #1) - Initiate Sensitive q6h SSI and adjust as needed  - Reduce MIVF to 85 mL/hr at 1800 - Monitor TPN labs on Mon/Thurs - With risk for refeeding syndrome, will order daily CMET, phos and mag daily from 12/13- 12/15  Saarah Dewing P 12/28/2021,7:38 AM

## 2021-12-28 NOTE — Progress Notes (Signed)
Peripherally Inserted Central Catheter Placement  The IV Nurse has discussed with the patient and/or persons authorized to consent for the patient, the purpose of this procedure and the potential benefits and risks involved with this procedure.  The benefits include less needle sticks, lab draws from the catheter, and the patient may be discharged home with the catheter. Risks include, but not limited to, infection, bleeding, blood clot (thrombus formation), and puncture of an artery; nerve damage and irregular heartbeat and possibility to perform a PICC exchange if needed/ordered by physician.  Alternatives to this procedure were also discussed.  Bard Power PICC patient education guide, fact sheet on infection prevention and patient information card has been provided to patient /or left at bedside.    PICC Placement Documentation  PICC Double Lumen 12/28/21 Right Basilic 44 cm 1 cm (Active)       Franne Grip Renee 12/28/2021, 12:21 PM

## 2021-12-28 NOTE — Progress Notes (Signed)
PROGRESS NOTE  Nathaniel Hanson BPZ:[id number] DOB: 1980/04/12   PCP: Creola Corn, MD  Patient is from: Home.  DOA: 12/21/2021 LOS: 6  Chief complaints Chief Complaint  Patient presents with   Syncopal Episode     Brief Narrative / Interim history: 41 year old M with no significant past medical history presenting with what looks like a syncopal episode and found to have sigmoid volvulus in ED.  General surgery and GI consulted.  Patient underwent flex sigmoidoscopy on 12/6 but abdominal x-ray with persistent massive dilation of sigmoid colon.  Patient underwent sigmoid colectomy and mobilization of splenic flexure on 12/7 by Dr.  Dossie Der. Postoperative course complicated by ileus and fascial dehiscence requiring repeat ex lap on 12/27/2021 by Dr. Magnus Ivan. NG tube placed.  Plan to start TPN.  Surgery following.  Syncope felt to be vasovagal in the setting of the above.  Workup including serial troponin, EKG, telemetry and echocardiogram unrevealing.   Subjective: Seen and examined earlier this morning.  Patient had emergent ex lap due to drainage from surgical site and found to have fascial dehiscence.  Pain fairly controlled.  NG tube in place.  Surgery to start TPN.  Objective: Vitals:   12/28/21 0939 12/28/21 0943 12/28/21 1233 12/28/21 1237  BP:  (!) 145/97  132/82  Pulse:  92  (!) 108  Resp: 20 16 16 18   Temp:  98.2 F (36.8 C)  98.4 F (36.9 C)  TempSrc:  Oral  Oral  SpO2: 99% 98% 97% 94%  Weight:      Height:        Examination:  GENERAL: No apparent distress.  Nontoxic. HEENT: MMM.  NG tube noted. NECK: Supple.  No apparent JVD.  RESP:  No IWOB.  Fair aeration bilaterally. CVS:  RRR. Heart sounds normal.  ABD/GI/GU: BS+. Abd soft.  Dressing DCI and new. MSK/EXT:  Moves extremities. No apparent deformity. No edema.  SKIN: no apparent skin lesion or wound NEURO: Awake and alert. Oriented appropriately.  No apparent focal neuro deficit. PSYCH: Calm. Normal  affect.   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 -Encourage mobilization and incentive spirometry  Vasovagal syncope: Work-up including serial troponin, EKG, telemetry and echocardiogram reassuring.  No significant cardiopulmonary finding on CT chest.  No significant personal or family history of cardiac disease.  No exertional dyspnea, syncope or chest pain.  Could be vasovagal from sigmoid volvulus.  -Continue telemetry monitoring  Sinus tachycardia: Likely from dehydration and pain.  Resolved.  Hyponatremia: Likely from dehydration.  Resolved.  Nocturnal cough: Due to allergy or/and/GERD?  Seems to have resolved. -Start IV PPI given n.p.o.  Obesity/prolonged n.p.o. Body mass index is 33.05 kg/m. Nutrition Problem: Inadequate oral intake Etiology: altered GI function (ileus) Signs/Symptoms: NPO status, other (comment) (starting TPN) Interventions: Refer to RD note for recommendations, TPN, MVI   DVT prophylaxis:  enoxaparin (LOVENOX) injection 40 mg Start: 12/24/21 1000 SCDs Start: 12/22/21 0028  Code Status: Full code Family Communication: Updated patient's brother over the phone. Level of  care: Telemetry Status is: Inpatient The patient will remain inpatient because: Sigmoid volvulus and fascial dehiscence   Final disposition: TBD Consultants:  General  surgery Gastroenterology  Sch Meds:  Scheduled Meds:  Chlorhexidine Gluconate Cloth  6 each Topical Daily   enoxaparin (LOVENOX) injection  40 mg Subcutaneous Q24H   insulin aspart  0-9 Units Subcutaneous Q6H   morphine   Intravenous Q4H   pantoprazole (PROTONIX) IV  40 mg Intravenous Q24H   sodium chloride flush  10-40 mL Intracatheter Q12H   sodium chloride flush  3 mL Intravenous Q12H   Continuous Infusions:  sodium chloride 125 mL/hr at 12/28/21 0734   sodium chloride     acetaminophen 1,000 mg (12/28/21 1351)   methocarbamol (ROBAXIN) IV     promethazine (PHENERGAN) injection (IM or IVPB) 6.25 mg (12/27/21 2021)   TPN ADULT (ION)     PRN Meds:.  Antimicrobials: Anti-infectives (From admission, onward)    Start     Dose/Rate Route Frequency Ordered Stop   12/27/21 2130  ceFAZolin (ANCEF) IVPB 2g/100 mL premix        2 g 200 mL/hr over 30 Minutes Intravenous  Once 12/27/21 2039 12/27/21 2138   12/23/21 1400  cefoTEtan (CEFOTAN) 2 g in sodium chloride 0.9 % 100 mL IVPB        2 g 200 mL/hr over 30 Minutes Intravenous 60 min pre-op 12/23/21 1040 12/23/21 1444        I have personally reviewed the following labs and images: CBC: Recent Labs  Lab 12/23/21 0610 12/24/21 0609 12/25/21 0625 12/27/21 0511 12/28/21 0459  WBC 5.9 10.2 11.1* 10.5 10.4  HGB 14.7 15.9 14.2 14.1 14.0  HCT 44.3 47.8 43.1 42.6 42.0  MCV 86.0 83.7 85.3 85.2 85.4  PLT 239 265 263 315 330   BMP &GFR Recent Labs  Lab 12/23/21 0610 12/24/21 0609 12/25/21 0625 12/27/21 0511 12/28/21 0459 12/28/21 0613  NA 138 132* 136 138 141  --   K 4.2 4.5 4.4 3.7 4.6  --   CL 105 99 104 106 105  --   CO2 25 23 25 25 30   --   GLUCOSE 89 113* 125* 121* 124*  --   BUN 11 10 10 16 16   --   CREATININE 0.97 1.14 0.97 0.93 0.94  --   CALCIUM 8.8* 8.5* 8.5* 8.5* 8.3*  --   MG 2.0  --  1.9 2.0  --  1.8  PHOS  --   --  2.9 2.6 4.3  --    Estimated Creatinine Clearance: 138 mL/min (by C-G formula based  on SCr of 0.94 mg/dL). Liver & Pancreas: Recent Labs  Lab 12/23/21 0610 12/25/21 0625 12/27/21 0511 12/28/21 0459  AST 30 33 18  --   ALT 34 27 18  --   ALKPHOS 79 64 60  --   BILITOT 1.7* 1.1 0.9  --   PROT 6.6 6.2* 6.1*  --   ALBUMIN 3.4* 3.1* 2.8* 2.6*   No results for input(s): "LIPASE", "AMYLASE" in the last 168 hours. No results for input(s): "AMMONIA" in the last 168 hours. Diabetic: No results for input(s): "HGBA1C" in the last 72 hours. Recent Labs  Lab 12/25/21 0438 12/26/21 0750 12/27/21 0522 12/28/21 0535 12/28/21 1221  GLUCAP 133* 110* 122* 133* 104*   Cardiac Enzymes: No results for input(s): "CKTOTAL", "CKMB", "CKMBINDEX", "TROPONINI" in the last 168 hours. No results for input(s): "PROBNP" in the last 8760 hours. Coagulation Profile: No results for  input(s): "INR", "PROTIME" in the last 168 hours. Thyroid Function Tests: Recent Labs    12/27/21 0510  TSH 1.651    Lipid Profile: No results for input(s): "CHOL", "HDL", "LDLCALC", "TRIG", "CHOLHDL", "LDLDIRECT" in the last 72 hours. Anemia Panel: No results for input(s): "VITAMINB12", "FOLATE", "FERRITIN", "TIBC", "IRON", "RETICCTPCT" in the last 72 hours. Urine analysis:    Component Value Date/Time   COLORURINE YELLOW 12/21/2021 0631   APPEARANCEUR CLEAR 12/21/2021 0631   LABSPEC >1.046 (H) 12/21/2021 0631   PHURINE 5.0 12/21/2021 0631   GLUCOSEU NEGATIVE 12/21/2021 0631   HGBUR NEGATIVE 12/21/2021 0631   BILIRUBINUR NEGATIVE 12/21/2021 0631   KETONESUR NEGATIVE 12/21/2021 0631   PROTEINUR NEGATIVE 12/21/2021 0631   NITRITE NEGATIVE 12/21/2021 0631   LEUKOCYTESUR NEGATIVE 12/21/2021 0631   Sepsis Labs: Invalid input(s): "PROCALCITONIN", "LACTICIDVEN"  Microbiology: Recent Results (from the past 240 hour(s))  Resp Panel by RT-PCR (Flu A&B, Covid) Anterior Nasal Swab     Status: None   Collection Time: 12/21/21 12:29 AM   Specimen: Anterior Nasal Swab  Result Value Ref Range Status    SARS Coronavirus 2 by RT PCR NEGATIVE NEGATIVE Final    Comment: (NOTE) SARS-CoV-2 target nucleic acids are NOT DETECTED.  The SARS-CoV-2 RNA is generally detectable in upper respiratory specimens during the acute phase of infection. The lowest concentration of SARS-CoV-2 viral copies this assay can detect is 138 copies/mL. A negative result does not preclude SARS-Cov-2 infection and should not be used as the sole basis for treatment or other patient management decisions. A negative result may occur with  improper specimen collection/handling, submission of specimen other than nasopharyngeal swab, presence of viral mutation(s) within the areas targeted by this assay, and inadequate number of viral copies(<138 copies/mL). A negative result must be combined with clinical observations, patient history, and epidemiological information. The expected result is Negative.  Fact Sheet for Patients:  BloggerCourse.com  Fact Sheet for Healthcare Providers:  SeriousBroker.it  This test is no t yet approved or cleared by the [place] FDA and  has been authorized for detection and/or diagnosis of SARS-CoV-2 by FDA under an Emergency Use Authorization (EUA). This EUA will remain  in effect (meaning this test can be used) for the duration of the COVID-19 declaration under Section 564(b)(1) of the Act, 21 U.S.C.section 360bbb-3(b)(1), unless the authorization is terminated  or revoked sooner.       Influenza A by PCR NEGATIVE NEGATIVE Final   Influenza B by PCR NEGATIVE NEGATIVE Final    Comment: (NOTE) The Xpert Xpress SARS-CoV-2/FLU/RSV plus assay is intended as an aid in the diagnosis of influenza from Nasopharyngeal swab specimens and should not be used as a sole basis for treatment. Nasal washings and aspirates are unacceptable for Xpert Xpress SARS-CoV-2/FLU/RSV testing.  Fact Sheet for  Patients: BloggerCourse.com  Fact Sheet for Healthcare Providers: SeriousBroker.it  This test is not yet approved or cleared by the [place] FDA and has been authorized for detection and/or diagnosis of SARS-CoV-2 by FDA under an Emergency Use Authorization (EUA). This EUA will remain in effect (meaning this test can be used) for the duration of the COVID-19 declaration under Section 564(b)(1) of the Act, 21 U.S.C. section 360bbb-3(b)(1), unless the authorization is terminated or revoked.  Performed at [facility], [address] [place], [place] [address]     Radiology Studies: [place] EKG SITE RITE  Result Date: 12/28/2021 If Site Rite image not attached, placement could not be confirmed due to current cardiac  rhythm.     Asli Tokarski T. Nita Whitmire [facility]  If 7PM-7AM, please contact night-coverage [email or web address] 12/28/2021, 3:54 PM

## 2021-12-28 NOTE — Anesthesia Postprocedure Evaluation (Signed)
Anesthesia Post Note  Patient: Nathaniel Hanson  Procedure(s) Performed: EXPLORATORY LAPAROTOMY, WOUND CLOSURE     Patient location during evaluation: PACU Anesthesia Type: General Level of consciousness: awake and alert Pain management: pain level controlled Vital Signs Assessment: post-procedure vital signs reviewed and stable Respiratory status: spontaneous breathing, nonlabored ventilation and respiratory function stable Cardiovascular status: blood pressure returned to baseline Postop Assessment: no apparent nausea or vomiting Anesthetic complications: no   No notable events documented.          Shanda Howells

## 2021-12-29 LAB — TRIGLYCERIDES: Triglycerides: 147 mg/dL (ref <150)

## 2021-12-29 LAB — PHOSPHORUS: Phosphorus: 3 mg/dL (ref 2.5–4.6)

## 2021-12-29 LAB — COMPREHENSIVE METABOLIC PANEL
ALT: 18 U/L (ref 0–44)
AST: 32 U/L (ref 15–41)
Albumin: 2.3 g/dL — ABNORMAL LOW (ref 3.5–5.0)
Alkaline Phosphatase: 44 U/L (ref 38–126)
Anion gap: 5 (ref 5–15)
BUN: 17 mg/dL (ref 6–20)
CO2: 27 mmol/L (ref 22–32)
Calcium: 7.9 mg/dL — ABNORMAL LOW (ref 8.9–10.3)
Chloride: 107 mmol/L (ref 98–111)
Creatinine, Ser: 0.83 mg/dL (ref 0.61–1.24)
GFR, Estimated: >60 mL/min (ref >60)
Glucose, Bld: 121 mg/dL — ABNORMAL HIGH (ref 70–99)
Potassium: 3.6 mmol/L (ref 3.5–5.1)
Sodium: 139 mmol/L (ref 135–145)
Total Bilirubin: 1 mg/dL (ref 0.3–1.2)
Total Protein: 5 g/dL — ABNORMAL LOW (ref 6.5–8.1)

## 2021-12-29 LAB — MAGNESIUM: Magnesium: 2.3 mg/dL (ref 1.7–2.4)

## 2021-12-29 LAB — GLUCOSE, CAPILLARY
Glucose-Capillary: 105 mg/dL — ABNORMAL HIGH (ref 70–99)
Glucose-Capillary: 107 mg/dL — ABNORMAL HIGH (ref 70–99)

## 2021-12-29 MED ORDER — SODIUM CHLORIDE 0.9 % IV SOLN: INTRAVENOUS | Status: AC

## 2021-12-29 MED ORDER — POTASSIUM CHLORIDE 10 MEQ/100ML IV SOLN: 10.0000 meq | INTRAVENOUS | Status: DC

## 2021-12-29 MED ORDER — TRAVASOL 10 % IV SOLN
INTRAVENOUS | Status: DC
  Filled 2021-12-29: qty 1008

## 2021-12-29 MED ORDER — TRAVASOL 10 % IV SOLN
INTRAVENOUS | Status: AC
  Filled 2021-12-29: qty 576

## 2021-12-29 MED ORDER — SODIUM CHLORIDE 0.9 % IV SOLN: INTRAVENOUS | Status: DC

## 2021-12-29 MED ORDER — POTASSIUM PHOSPHATES 15 MMOLE/5ML IV SOLN
20.0000 mmol | Freq: Once | INTRAVENOUS | Status: AC
  Administered 2021-12-29: 20 mmol via INTRAVENOUS
  Filled 2021-12-29: qty 6.67

## 2021-12-29 NOTE — Progress Notes (Addendum)
PHARMACY - TOTAL PARENTERAL NUTRITION CONSULT NOTE   Indication: Prolonged ileus  Patient Measurements: Height: 6\' 1"  (185.4 cm) Weight: 113.6 kg (250 lb 8 oz) IBW/kg (Calculated) : 79.9 TPN AdjBW (KG): 88.9 Body mass index is 33.05 kg/m. Usual Weight:   Assessment: Patient is a 41 y.o M who presented to the ED on 12/21/21 for evaluation after he experienced syncope and blacked out at home.  Abdominal CT on 12/21/21 showed findings consistent with sigmoid volvulus.He underwent sigmoidoscopy on 12/22/21 with decompression of the volvulus. However, he still had significant sigmoid colon dilation s/p procedure and subsequently underwent sigmoid colectomy on 12/23/21. On 12/11, he had bleeding from surgical incision site as well as n/v and was found to have "fascial dehiscence and near evisceration of his bowel."  He was taken to the OR emergently on 12/27/21 for  exp lap with irrigation of the midline incision.  Abd x-ray on 12/27/21 showed findings consistent with post-op ileus. Pharmacy has been consulted to start TPN.   Glucose / Insulin: no hx DM; not on insulin - goal cbgs <150: wnl - no insulin given in past 24 hours  Electrolytes: electrolytes wnl including CorrCa at 9.3 - K+ did decrease from 4.6 to 3.6, phos from 4.3 to 3.0  - For ileus, goal for Mag >2, K > 4, phos ~3 Renal: scr stable at 0.94 (crcl~100) Hepatic:  - LFTs wnl with labs on 12/11 - albumin low 2.3 Intake / Output; MIVF: NS at 85 ml/hr - I/O: - 111 mL - NG output: 200 mL - UOP: 1700 mL GI Imaging: - 12/5 abd CT: Findings compatible with sigmoid volvulus. - 12/11 abd xray: Diffuse gaseous distention of bowel, compatible with postoperative ileus. GI Surgeries / Procedures:  - 12/6 Flexible Sigmoidoscopy: Volvulus. Successful complete decompression achieved - 12/7: sigmoid colectomy  - 12/11: exp lap with irrigation of midline incision  Central access: pending PICC placement  TPN start date: 12/28/21 pending PICC  placement  Nutritional Goals: Goal TPN rate is 100 mL/hr (provides 144 g of protein and 2400 kcals per day)  - 12/6: started on CLD  RD Assessment: - 10-23-1987, RD indicated that patient is at risk for refeeding syndrome and recom thiamine 100 mg for at least 5 days.  Estimated Needs Total Energy Estimated Needs: 2400-2750 kcals Total Protein Estimated Needs: 140-170 grams Total Fluid Estimated Needs: >/= 2.1L  Current Nutrition:  Clear liquids  - To start TPN on 12/28/21  Plan:   - Now: potassium phosphate  20 meq IV  x 1   At 1800: -  Continue TPN at initial 40  mL/hr rate due to concern with refeeding syndrome  - Electrolytes in TPN:  Na 72mEq/L  Inc K to 52mEq/L Ca 59mEq/L  Mg 23mEq/L Inc Phos to 20 mmol/L Cl:Ac 1:1 - Add standard MVI and trace elements to TPN - Add thiamine 100 mg to TPN (start on 12/12 - today is day #2) - Initiate Sensitive q6h SSI and adjust as needed  - Continue  MIVF to 85 mL/hr at 1800 - Monitor TPN labs on Mon/Thurs - With risk for refeeding syndrome, will order daily CMET, phos and mag daily from 12/13- 12/15   08-19-1978, PharmD, BCPS 12/29/2021 10:02 AM

## 2021-12-29 NOTE — Progress Notes (Signed)
PROGRESS NOTE  Nathaniel Hanson LZJ:[id number] DOB: February 16, 1980   PCP: Creola Corn, MD  Patient is from: Home.  DOA: 12/21/2021 LOS: 7  Chief complaints Chief Complaint  Patient presents with   Syncopal Episode     Brief Narrative / Interim history: 41 year old M with no significant past medical history presenting with what looks like a syncopal episode and found to have sigmoid volvulus in ED.  General surgery and GI consulted.  Patient underwent flex sigmoidoscopy on 12/6 but abdominal x-ray with persistent massive dilation of sigmoid colon.  Patient underwent sigmoid colectomy and mobilization of splenic flexure on 12/7 by Dr.  Dossie Der. Postoperative course complicated by ileus and fascial dehiscence requiring repeat ex lap on 12/27/2021 by Dr. Magnus Ivan. NG tube placed.  Now on TPN for nutrition and PCA Dilaudid for pain control.  General surgery following.  Syncope felt to be vasovagal in the setting of the above.  Workup including serial troponin, EKG, telemetry and echocardiogram unrevealing.   Subjective: Seen and examined earlier this morning.  No major events overnight of this morning.  No complaints other than pain.  He rates his pain 8/10 although he does not appear to be in that much distress.  He is sitting on bedside chair.  No other complaints.  No flatus or bowel movement yet.  Objective: Vitals:   12/28/21 2342 12/29/21 0413 12/29/21 0554 12/29/21 0833  BP:   116/73   Pulse:   97   Resp: 19 16 20 19   Temp:   98.4 F (36.9 C)   TempSrc:   Oral   SpO2: 96% 96% 98% 96%  Weight:      Height:        Examination:  GENERAL: No apparent distress.  Nontoxic. HEENT: MMM.  Vision and hearing grossly intact.  NECK: Supple.  No apparent JVD.  RESP:  No IWOB.  Fair aeration bilaterally. CVS:  RRR. Heart sounds normal.  ABD/GI/GU: BS quiet.. Abd soft.  Surgical wound dressing DCI. MSK/EXT:  Moves extremities. No apparent deformity. No edema.  SKIN: Surgical wound  dressing DCI. NEURO: Awake and alert. Oriented appropriately.  No apparent focal neuro deficit. PSYCH: Calm. Normal affect.   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 -Encourage mobilization and incentive spirometry  Vasovagal syncope: Work-up including serial troponin, EKG, telemetry and echocardiogram reassuring.  No significant cardiopulmonary finding on CT chest.  No significant personal or family history of cardiac disease.  No exertional dyspnea, syncope or chest pain.  Could be vasovagal from sigmoid volvulus.  -Continue telemetry monitoring  Sinus tachycardia: Likely from dehydration and pain.  Resolved.  Hyponatremia: Likely from dehydration.  Resolved.  Nocturnal cough: Due to allergy or/and/GERD?  Seems to have resolved. -Start IV PPI given n.p.o.  Obesity/prolonged n.p.o. Body mass index is 33.05 kg/m. Nutrition Problem: Inadequate oral intake Etiology: altered GI function (ileus) Signs/Symptoms: NPO status, other (comment) (starting TPN) Interventions: Refer to RD note for recommendations, TPN, MVI   DVT prophylaxis:  enoxaparin (LOVENOX) injection 40 mg  Start: 12/24/21 1000 SCDs Start: 12/22/21 0028  Code Status: Full code Family Communication: None at bedside today.  Updated patient's brother over the phone on 12/12. Level of care: Med-Surg Status is:  Inpatient The patient will remain inpatient because: Sigmoid volvulus and fascial dehiscence   Final disposition: TBD Consultants:  General surgery Gastroenterology  Sch Meds:  Scheduled Meds:  Chlorhexidine Gluconate Cloth  6 each Topical Daily   enoxaparin (LOVENOX) injection  40 mg Subcutaneous Q24H   insulin aspart  0-9 Units Subcutaneous Q6H   morphine   Intravenous Q4H   pantoprazole (PROTONIX) IV  40 mg Intravenous Q24H   sodium chloride flush  10-40 mL Intracatheter Q12H   sodium chloride flush  3 mL Intravenous Q12H   Continuous Infusions:  sodium chloride     methocarbamol (ROBAXIN) IV     potassium PHOSPHATE IVPB (in mmol)     promethazine (PHENERGAN) injection (IM or IVPB) 6.25 mg (12/27/21 2021)   TPN ADULT (ION) 40 mL/hr at 12/28/21 1812   TPN ADULT (ION)     PRN Meds:.  Antimicrobials: Anti-infectives (From admission, onward)    Start     Dose/Rate Route Frequency Ordered Stop   12/27/21 2130  ceFAZolin (ANCEF) IVPB 2g/100 mL premix        2 g 200 mL/hr over 30 Minutes Intravenous  Once 12/27/21 2039 12/27/21 2138   12/23/21 1400  cefoTEtan (CEFOTAN) 2 g in sodium chloride 0.9 % 100 mL IVPB        2 g 200 mL/hr over 30 Minutes Intravenous 60 min pre-op 12/23/21 1040 12/23/21 1444        I have personally reviewed the following labs and images: CBC: Recent Labs  Lab 12/23/21 0610 12/24/21 0609 12/25/21 0625 12/27/21 0511 12/28/21 0459  WBC 5.9 10.2 11.1* 10.5 10.4  HGB 14.7 15.9 14.2 14.1 14.0  HCT 44.3 47.8 43.1 42.6 42.0  MCV 86.0 83.7 85.3 85.2 85.4  PLT 239 265 263 315 330   BMP &GFR Recent Labs  Lab 12/23/21 0610 12/24/21 0609 12/25/21 0625 12/27/21 0511 12/28/21 0459 12/28/21 0613 12/29/21 0220  NA 138 132* 136 138 141  --  139  K 4.2 4.5 4.4 3.7 4.6  --  3.6  CL 105 99 104 106 105  --  107  CO2 25 23 25 25 30   --  27  GLUCOSE 89 113* 125* 121* 124*  --  121*  BUN 11 10 10 16 16   --  17  CREATININE 0.97 1.14 0.97 0.93 0.94   --  0.83  CALCIUM 8.8* 8.5* 8.5* 8.5* 8.3*  --  7.9*  MG 2.0  --  1.9 2.0  --  1.8 2.3  PHOS  --   --  2.9 2.6 4.3  --  3.0   Estimated Creatinine Clearance: 156.3 mL/min (by C-G formula based on SCr of 0.83 mg/dL). Liver & Pancreas: Recent Labs  Lab 12/23/21 0610 12/25/21 0625 12/27/21 0511 12/28/21 0459 12/29/21 0220  AST 30 33 18  --  32  ALT 34 27 18  --  18  ALKPHOS 79 64 60  --  44  BILITOT 1.7* 1.1 0.9  --  1.0  PROT 6.6 6.2* 6.1*  --  5.0*  ALBUMIN 3.4* 3.1* 2.8* 2.6* 2.3*   No results for input(s): "LIPASE", "AMYLASE" in the last 168 hours. No results for input(s): "AMMONIA" in the last 168 hours. Diabetic: No results for input(s): "HGBA1C" in the last 72 hours. Recent  Labs  Lab 12/28/21 0535 12/28/21 1221 12/28/21 1740 12/28/21 2335 12/29/21 0512  GLUCAP 133* 104* 104* 120* 105*   Cardiac Enzymes: No results for input(s): "CKTOTAL", "CKMB", "CKMBINDEX", "TROPONINI" in the last 168 hours. No results for input(s): "PROBNP" in the last 8760 hours. Coagulation Profile: No results for input(s): "INR", "PROTIME" in the last 168 hours. Thyroid Function Tests: Recent Labs    12/27/21 0510  TSH 1.651    Lipid Profile: Recent Labs    12/29/21 0220  TRIG 147   Anemia Panel: No results for input(s): "VITAMINB12", "FOLATE", "FERRITIN", "TIBC", "IRON", "RETICCTPCT" in the last 72 hours. Urine analysis:    Component Value Date/Time   COLORURINE YELLOW 12/21/2021 0631   APPEARANCEUR CLEAR 12/21/2021 0631   LABSPEC >1.046 (H) 12/21/2021 0631   PHURINE 5.0 12/21/2021 0631   GLUCOSEU NEGATIVE 12/21/2021 0631   HGBUR NEGATIVE 12/21/2021 0631   BILIRUBINUR NEGATIVE 12/21/2021 0631   KETONESUR NEGATIVE 12/21/2021 0631   PROTEINUR NEGATIVE 12/21/2021 0631   NITRITE NEGATIVE 12/21/2021 0631   LEUKOCYTESUR NEGATIVE 12/21/2021 0631   Sepsis Labs: Invalid input(s): "PROCALCITONIN", "LACTICIDVEN"  Microbiology: Recent Results (from the past 240 hour(s))  Resp  Panel by RT-PCR (Flu A&B, Covid) Anterior Nasal Swab     Status: None   Collection Time: 12/21/21 12:29 AM   Specimen: Anterior Nasal Swab  Result Value Ref Range Status   SARS Coronavirus 2 by RT PCR NEGATIVE NEGATIVE Final    Comment: (NOTE) SARS-CoV-2 target nucleic acids are NOT DETECTED.  The SARS-CoV-2 RNA is generally detectable in upper respiratory specimens during the acute phase of infection. The lowest concentration of SARS-CoV-2 viral copies this assay can detect is 138 copies/mL. A negative result does not preclude SARS-Cov-2 infection and should not be used as the sole basis for treatment or other patient management decisions. A negative result may occur with  improper specimen collection/handling, submission of specimen other than nasopharyngeal swab, presence of viral mutation(s) within the areas targeted by this assay, and inadequate number of viral copies(<138 copies/mL). A negative result must be combined with clinical observations, patient history, and epidemiological information. The expected result is Negative.  Fact Sheet for Patients:  BloggerCourse.com  Fact Sheet for Healthcare Providers:  SeriousBroker.it  This test is no t yet approved or cleared by the [place] FDA and  has been authorized for detection and/or diagnosis of SARS-CoV-2 by FDA under an Emergency Use Authorization (EUA). This EUA will remain  in effect (meaning this test can be used) for the duration of the COVID-19 declaration under Section 564(b)(1) of the Act, 21 U.S.C.section 360bbb-3(b)(1), unless the authorization is terminated  or revoked sooner.       Influenza A by PCR NEGATIVE NEGATIVE Final   Influenza B by PCR NEGATIVE NEGATIVE Final    Comment: (NOTE) The Xpert Xpress SARS-CoV-2/FLU/RSV plus assay is intended as an aid in the diagnosis of influenza from Nasopharyngeal swab specimens and should not be used as a sole  basis for treatment. Nasal washings and aspirates are unacceptable for Xpert Xpress SARS-CoV-2/FLU/RSV testing.  Fact Sheet for Patients: BloggerCourse.com  Fact Sheet for Healthcare Providers: SeriousBroker.it  This test is not yet approved or cleared by the [place] FDA and has been authorized for detection and/or diagnosis of SARS-CoV-2 by FDA under an Emergency Use Authorization (EUA). This EUA will remain in effect (meaning this test can be used) for the duration of the COVID-19 declaration under Section 564(b)(1) of the Act, 21 U.S.C. section 360bbb-3(b)(1), unless  the authorization is terminated or revoked.  Performed at [facility], [address], [place], [place] [address]     Radiology Studies: No results found.    Shareef Eddinger T. Chrissi Crow [facility]  If 7PM-7AM, please contact night-coverage [email or web address] 12/29/2021, 11:46 AM

## 2021-12-29 NOTE — Progress Notes (Signed)
2 Days Post-Op   Subjective/Chief Complaint: Sitting up in chair. Feeling a little better this am - using PCA for pain control. No bowel function yet. Voiding at baseline following foley removal   Objective: Vital signs in last 24 hours: Temp:  [98.2 F (36.8 C)-98.4 F (36.9 C)] 98.4 F (36.9 C) (12/13 0554) Pulse Rate:  [92-108] 97 (12/13 0554) Resp:  [16-20] 20 (12/13 0554) BP: (116-145)/(73-97) 116/73 (12/13 0554) SpO2:  [94 %-99 %] 98 % (12/13 0554) FiO2 (%):  [0 %] 0 % (12/13 0413) Weight:  [113.6 kg] 113.6 kg (12/12 0915) Last BM Date : 12/27/21  Intake/Output from previous day: 12/12 0701 - 12/13 0700 In: 1788.9 [I.V.:1538.9; NG/GT:50; IV Piggyback:200] Out: 1900 [Urine:1700; Emesis/NG output:200] Intake/Output this shift: No intake/output data recorded.  Abd - moderate distension. Soft. Mild TTP over incision without peritonitis. Dressing c/d/I without drainage- dressing change by me this am. Wound base intact with appropriate bleeding and no drainage. Blister from prior adhesive along left lateral margin covered with gauze. NGT with bilious drainage in tubing   Lab Results:  Recent Labs    12/27/21 0511 12/28/21 0459  WBC 10.5 10.4  HGB 14.1 14.0  HCT 42.6 42.0  PLT 315 330    BMET Recent Labs    12/28/21 0459 12/29/21 0220  NA 141 139  K 4.6 3.6  CL 105 107  CO2 30 27  GLUCOSE 124* 121*  BUN 16 17  CREATININE 0.94 0.83  CALCIUM 8.3* 7.9*    PT/INR No results for input(s): "LABPROT", "INR" in the last 72 hours. ABG No results for input(s): "PHART", "HCO3" in the last 72 hours.  Invalid input(s): "PCO2", "PO2"  Studies/Results: [place] EKG SITE RITE  Result Date: 12/28/2021 If Site Rite image not attached, placement could not be confirmed due to current cardiac rhythm.  DG Abd Portable 1V  Result Date: 12/27/2021 CLINICAL DATA:  Ileus following gastrointestinal surgery (HCC) EXAM: PORTABLE ABDOMEN - 1 VIEW COMPARISON:  Radiograph 12/22/2021,  CT 12/21/2021 FINDINGS: Recent midline skin staples. There is diffuse gaseous distention of bowel. IMPRESSION: Diffuse gaseous distention of bowel, compatible with postoperative ileus. Electronically Signed   By: Caprice Renshaw M.D.   On: 12/27/2021 11:18    Anti-infectives: Anti-infectives (From admission, onward)    Start     Dose/Rate Route Frequency Ordered Stop   12/27/21 2130  ceFAZolin (ANCEF) IVPB 2g/100 mL premix        2 g 200 mL/hr over 30 Minutes Intravenous  Once 12/27/21 2039 12/27/21 2138   12/23/21 1400  cefoTEtan (CEFOTAN) 2 g in sodium chloride 0.9 % 100 mL IVPB        2 g 200 mL/hr over 30 Minutes Intravenous 60 min pre-op 12/23/21 1040 12/23/21 1444       Assessment/Plan: Sigmoid volvulus POD 6 s/p sigmoid colectomy, mobilization of splenic flexure Dr. Dossie Der 12/7 POD 2 s/p exploratory laparotomy placement of retention sutures for fascial dehiscence Dr. 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

## 2021-12-29 NOTE — Progress Notes (Addendum)
Mobility Specialist - Progress Note   12/29/21 1502  Mobility  Activity Ambulated with assistance in hallway  Level of Assistance Standby assist, set-up cues, supervision of patient - no hands on  Assistive Device Front wheel walker  Distance Ambulated (ft) 240 ft  Activity Response Tolerated well  Mobility Referral Yes  $Mobility charge 1 Mobility   Pt received in recliner and agreeable to mobility. Pt NG tube slipped out & fell on the floor in the middle of the hallway.  No other complaints during mobility. Pt to recliner after session with all needs met.   Pre-mobility: 102bpm HR, 97% SpO2 Post-mobility: 107bpm HR, 97% SPO2   Set designer

## 2021-12-30 ENCOUNTER — Inpatient Hospital Stay (HOSPITAL_COMMUNITY): Payer: Self-pay

## 2021-12-30 LAB — CBC
HCT: 36.8 % — ABNORMAL LOW (ref 39.0–52.0)
Hemoglobin: 12.1 g/dL — ABNORMAL LOW (ref 13.0–17.0)
MCH: 28.6 pg (ref 26.0–34.0)
MCHC: 32.9 g/dL (ref 30.0–36.0)
MCV: 87 fL (ref 80.0–100.0)
Platelets: 288 10*3/uL (ref 150–400)
RBC: 4.23 MIL/uL (ref 4.22–5.81)
RDW: 14.1 % (ref 11.5–15.5)
WBC: 9.9 10*3/uL (ref 4.0–10.5)
nRBC: 0 % (ref 0.0–0.2)

## 2021-12-30 LAB — COMPREHENSIVE METABOLIC PANEL
ALT: 25 U/L (ref 0–44)
AST: 36 U/L (ref 15–41)
Albumin: 2.7 g/dL — ABNORMAL LOW (ref 3.5–5.0)
Alkaline Phosphatase: 56 U/L (ref 38–126)
Anion gap: 5 (ref 5–15)
BUN: 15 mg/dL (ref 6–20)
CO2: 26 mmol/L (ref 22–32)
Calcium: 8.3 mg/dL — ABNORMAL LOW (ref 8.9–10.3)
Chloride: 107 mmol/L (ref 98–111)
Creatinine, Ser: 0.7 mg/dL (ref 0.61–1.24)
GFR, Estimated: >60 mL/min (ref >60)
Glucose, Bld: 116 mg/dL — ABNORMAL HIGH (ref 70–99)
Potassium: 3.6 mmol/L (ref 3.5–5.1)
Sodium: 138 mmol/L (ref 135–145)
Total Bilirubin: 1.9 mg/dL — ABNORMAL HIGH (ref 0.3–1.2)
Total Protein: 5.7 g/dL — ABNORMAL LOW (ref 6.5–8.1)

## 2021-12-30 LAB — CBC WITH DIFFERENTIAL/PLATELET
Abs Immature Granulocytes: 0.03 10*3/uL (ref 0.00–0.07)
Basophils Absolute: 0 10*3/uL (ref 0.0–0.1)
Basophils Relative: 0 %
Eosinophils Absolute: 0.2 10*3/uL (ref 0.0–0.5)
Eosinophils Relative: 2 %
HCT: 37.9 % — ABNORMAL LOW (ref 39.0–52.0)
Hemoglobin: 12.1 g/dL — ABNORMAL LOW (ref 13.0–17.0)
Immature Granulocytes: 0 %
Lymphocytes Relative: 11 %
Lymphs Abs: 1 10*3/uL (ref 0.7–4.0)
MCH: 27.8 pg (ref 26.0–34.0)
MCHC: 31.9 g/dL (ref 30.0–36.0)
MCV: 86.9 fL (ref 80.0–100.0)
Monocytes Absolute: 0.8 10*3/uL (ref 0.1–1.0)
Monocytes Relative: 8 %
Neutro Abs: 7.8 10*3/uL — ABNORMAL HIGH (ref 1.7–7.7)
Neutrophils Relative %: 79 %
Platelets: 301 10*3/uL (ref 150–400)
RBC: 4.36 MIL/uL (ref 4.22–5.81)
RDW: 14.1 % (ref 11.5–15.5)
WBC: 9.9 10*3/uL (ref 4.0–10.5)
nRBC: 0 % (ref 0.0–0.2)

## 2021-12-30 LAB — GLUCOSE, CAPILLARY
Glucose-Capillary: 110 mg/dL — ABNORMAL HIGH (ref 70–99)
Glucose-Capillary: 113 mg/dL — ABNORMAL HIGH (ref 70–99)
Glucose-Capillary: 113 mg/dL — ABNORMAL HIGH (ref 70–99)
Glucose-Capillary: 117 mg/dL — ABNORMAL HIGH (ref 70–99)
Glucose-Capillary: 117 mg/dL — ABNORMAL HIGH (ref 70–99)
Glucose-Capillary: 128 mg/dL — ABNORMAL HIGH (ref 70–99)

## 2021-12-30 LAB — PHOSPHORUS: Phosphorus: 3.2 mg/dL (ref 2.5–4.6)

## 2021-12-30 LAB — TRIGLYCERIDES: Triglycerides: 120 mg/dL (ref <150)

## 2021-12-30 LAB — MAGNESIUM: Magnesium: 2 mg/dL (ref 1.7–2.4)

## 2021-12-30 MED ORDER — TRAVASOL 10 % IV SOLN
INTRAVENOUS | Status: AC
  Filled 2021-12-30: qty 1008

## 2021-12-30 MED ORDER — SODIUM CHLORIDE 0.9 % IV SOLN: INTRAVENOUS | Status: AC

## 2021-12-30 MED ORDER — PHENOL 1.4 % MT LIQD: 1.0000 | OROMUCOSAL | Status: DC | PRN

## 2021-12-30 MED ORDER — POTASSIUM CHLORIDE 10 MEQ/100ML IV SOLN
10.0000 meq | INTRAVENOUS | Status: AC
  Administered 2021-12-30 (×2): 10 meq via INTRAVENOUS
  Filled 2021-12-30 (×2): qty 100

## 2021-12-30 NOTE — Progress Notes (Signed)
   12/30/21 1050  Clinical Encounter Type  Visited With Patient  Visit Type Initial;Social support;Psychological support  Referral From Nurse  Consult/Referral To Chaplain  Spiritual Encounters  Spiritual Needs Emotional  Stress Factors  Patient Stress Factors Health changes;Loss of control   CH visited pt. per [facility] consult for support; pt. sitting in recliner awake when CH arrived.  This admission has been pt.'s first time undergoing surgery and pt. says that after being hospitalized for 11 days now he is beginning to feel discouraged.  He says the medical team have told him he is making progress but he is unsure how much longer he will need to remain in the hospital. Pt. says he lives with his brother and his sister in law but that this living situation "isn't great".  Overall pt. did not share immediate support needs but requests future check-ins by spiritual care team if possible.  Elpidio Anis PRN Chaplain Pager: [phone]

## 2021-12-30 NOTE — Progress Notes (Signed)
PHARMACY - TOTAL PARENTERAL NUTRITION CONSULT NOTE   Indication: Prolonged ileus  Patient Measurements: Height: 6\' 1"  (185.4 cm) Weight: 113.6 kg (250 lb 8 oz) IBW/kg (Calculated) : 79.9 TPN AdjBW (KG): 88.9 Body mass index is 33.05 kg/m. Usual Weight:   Assessment: Patient is a 41 y.o M who presented to the ED on 12/21/21 for evaluation after he experienced syncope and blacked out at home.  Abdominal CT on 12/21/21 showed findings consistent with sigmoid volvulus.He underwent sigmoidoscopy on 12/22/21 with decompression of the volvulus. However, he still had significant sigmoid colon dilation s/p procedure and subsequently underwent sigmoid colectomy on 12/23/21. On 12/11, he had bleeding from surgical incision site as well as n/v and was found to have "fascial dehiscence and near evisceration of his bowel."  He was taken to the OR emergently on 12/27/21 for  exp lap with irrigation of the midline incision.  Abd x-ray on 12/27/21 showed findings consistent with post-op ileus. Pharmacy has been consulted to start TPN.   Glucose / Insulin: no hx DM - on sSSI q6h (no insulin given in the past 24 hrs) - goal cbgs <150: wnl Electrolytes: K 3.6; other lytes wnl - For ileus, goal for Mag > 2, K > 4, phos ~3 Renal: scr stable at 0.94 (crcl~100) Hepatic:  - LFTs wnl with labs on 12/11 - albumin low 2.7 - TG 120 (12/14) Intake / Output; MIVF: NS at 85 ml/hr - I/O: +1063 mL - UOP: 750 mL GI Imaging: - 12/5 abd CT: Findings compatible with sigmoid volvulus. - 12/11 abd xray: Diffuse gaseous distention of bowel, compatible with postoperative ileus. GI Surgeries / Procedures:  - 12/6 Flexible Sigmoidoscopy: Volvulus. Successful complete decompression achieved - 12/7: sigmoid colectomy  - 12/11: exp lap with irrigation of midline incision - 12/11: NGT placed >> came out on 12/13  Central access: pending PICC placement  TPN start date: 12/28/21 pending PICC placement  Nutritional Goals: Goal  TPN rate is 100 mL/hr (provides 144 g of protein and 2400 kcals per day)  - 12/6: started on CLD  RD Assessment: - 10-23-1987, RD indicated that patient is at risk for refeeding syndrome and recom thiamine 100 mg for 5 days.  Estimated Needs Total Energy Estimated Needs: 2400-2750 kcals Total Protein Estimated Needs: 140-170 grams Total Fluid Estimated Needs: >/= 2.1L  Current Nutrition:  Clear liquids  - To start TPN on 12/28/21  Plan:   Now:  - potassium chloride 10 mEq IV x2 runs  At 1800: -  Increase TPN to 70  mL/hr  - Electrolytes in TPN:  Na 49mEq/L Cont 41mEq/L Ca 52mEq/L Mg 61mEq/L Phos 15 mmol/L Cl:Ac 1:1 - Add standard MVI and trace elements to TPN - Add thiamine 100 mg to TPN (start on 12/12 - today is day #3/5) - Initiate Sensitive q6h SSI and adjust as needed  - Reduce MIVF to 50 mL/hr at 1800 - Monitor TPN labs on Mon/Thurs - With risk for refeeding syndrome, will order daily CMET, phos and mag daily from 12/13- 12/15   08-19-1978, PharmD, BCPS 12/30/2021 7:20 AM

## 2021-12-30 NOTE — Plan of Care (Signed)
Patient alert and oriented  x 4. C/o pain on abdominal incision, managed by PCA. Wound dressing changed. TPN and IVF ongoing. X 1 assist transfer to bed. VS monitored. Safety and fall precautions observed.   Problem: Education: Goal: Knowledge of condition and prescribed therapy will improve Outcome: Progressing   Problem: Education: Goal: Knowledge of General Education information will improve Description: Including pain rating scale, medication(s)/side effects and non-pharmacologic comfort measures Outcome: Progressing   Problem: Clinical Measurements: Goal: Ability to maintain clinical measurements within normal limits will improve Outcome: Progressing Goal: Will remain free from infection Outcome: Progressing Goal: Respiratory complications will improve Outcome: Progressing   Problem: Pain Managment: Goal: General experience of comfort will improve Outcome: Progressing   Problem: Safety: Goal: Ability to remain free from injury will improve Outcome: Progressing   Problem: Skin Integrity: Goal: Risk for impaired skin integrity will decrease Outcome: Progressing   Problem: Respiratory: Goal: Respiratory status will improve Outcome: Progressing   Problem: Skin Integrity: Goal: Will show signs of wound healing Outcome: Progressing

## 2021-12-30 NOTE — Progress Notes (Signed)
Mobility Specialist - Progress Note   12/30/21 0943  Oxygen Therapy  O2 Device Nasal Cannula  O2 Flow Rate (L/min) 2 L/min  Mobility  Activity Ambulated with assistance in hallway;Ambulated with assistance to bathroom  Level of Assistance Standby assist, set-up cues, supervision of patient - no hands on  Assistive Device Front wheel walker  Distance Ambulated (ft) 460 ft  Activity Response Tolerated well  Mobility Referral Yes  $Mobility charge 1 Mobility   Pt received in bed and agreeable to mobility. C/o feeling dizzy once getting out of bed. Pt was still eager to ambulate despite feeling dizzy. Pt requested assistance to bathroom. No other complaints during mobility session. Pt to recliner after session with all needs met & call bell in reach.     [facility]

## 2021-12-30 NOTE — Progress Notes (Signed)
PROGRESS NOTE  Nathaniel Hanson XNT:[id number] DOB: [birth year]   PCP: Creola Corn, MD  Patient is from: Home.  DOA: 12/21/2021 LOS: 8  Chief complaints Chief Complaint  Patient presents with   Syncopal Episode     Brief Narrative / Interim history: 41 year old M with no significant past medical history presenting with what looks like a syncopal episode and found to have sigmoid volvulus in ED.  General surgery and GI consulted.  Patient underwent flex sigmoidoscopy on 12/6 but abdominal x-ray with persistent massive dilation of sigmoid colon.  Patient underwent sigmoid colectomy and mobilization of splenic flexure on 12/7 by Dr.  Dossie Der. Postoperative course complicated by ileus and fascial dehiscence requiring repeat ex lap on 12/27/2021 by Dr. Magnus Ivan. NG tube placed.  Now on TPN for nutrition and PCA Dilaudid for pain control.  General surgery following.  Syncope felt to be vasovagal in the setting of the above.  Workup including serial troponin, EKG, telemetry and echocardiogram unrevealing.   Subjective: Seen and examined earlier this morning.  No major events overnight of this morning.  Spiked fever to 100.6 overnight.  Sitting on bedside chair.  He has some nausea after his NG tube fell out yesterday.  No emesis.  Reports some abdominal pain.  No bowel movement or flatus yet.  Objective: Vitals:   12/30/21 0407 12/30/21 0412 12/30/21 0600 12/30/21 1323  BP:  130/75  122/78  Pulse:  (!) 103 93 (!) 101  Resp: 17 17  18   Temp:  (!) 100.4 F (38 C) (!) 100.8 F (38.2 C) 98.9 F (37.2 C)  TempSrc:  Oral Oral Oral  SpO2: 98% 97%  99%  Weight:      Height:        Examination:  GENERAL: Sitting up bedside chair.  No apparent distress. HEENT: MMM.  Vision and hearing grossly intact.  NGT in place. NECK: Supple.  No apparent JVD.  RESP:  No IWOB.  Fair aeration bilaterally. CVS:  RRR. Heart sounds normal.  ABD/GI/GU: Bowel sounds quiet.  Abdominal binder in place.   No rebound tenderness. MSK/EXT:  Moves extremities. No apparent deformity. No edema.  SKIN: no apparent skin lesion or wound NEURO: Awake and alert. Oriented appropriately.  No apparent focal neuro deficit. PSYCH: Calm. Normal affect.   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 -Encourage mobilization and incentive spirometry  Vasovagal syncope: Work-up including serial troponin, EKG, telemetry and echocardiogram reassuring.  No significant cardiopulmonary finding on CT chest.  No significant personal or family history of cardiac disease.  No exertional dyspnea, syncope or chest pain.  Could be vasovagal from sigmoid volvulus.  -Continue telemetry monitoring  Sinus tachycardia: Likely from dehydration and pain.  Resolved.  Hyponatremia: Likely from dehydration.  Resolved.  Nocturnal cough: Due to allergy or/and/GERD?  Seems to have resolved. -Start IV PPI given n.p.o.  Mild fever: Spiked fever to 100.6 overnight.  No leukocytosis, respiratory or UTI symptoms.  Abdominal exam reassuring. -Monitor off antibiotics. -Encourage incentive spirometry  Obesity/prolonged  n.p.o. Body mass index is 33.05 kg/m. Nutrition Problem: Inadequate oral intake Etiology: altered GI function (ileus) Signs/Symptoms: NPO status, other (comment) (starting TPN) Interventions: Refer to RD note for  recommendations, TPN, MVI   DVT prophylaxis:  enoxaparin (LOVENOX) injection 40 mg Start: 12/24/21 1000 SCDs Start: 12/22/21 0028  Code Status: Full code Family Communication: None at bedside today.  Level of care: Med-Surg Status is: Inpatient The patient will remain inpatient because: Sigmoid volvulus and fascial dehiscence   Final disposition: TBD Consultants:  General surgery Gastroenterology  Sch Meds:  Scheduled Meds:  Chlorhexidine Gluconate Cloth  6 each Topical Daily   enoxaparin (LOVENOX) injection  40 mg Subcutaneous Q24H   insulin aspart  0-9 Units Subcutaneous Q6H   morphine   Intravenous Q4H   pantoprazole (PROTONIX) IV  40 mg Intravenous Q24H   sodium chloride flush  10-40 mL Intracatheter Q12H   sodium chloride flush  3 mL Intravenous Q12H   Continuous Infusions:  sodium chloride 85 mL/hr at 12/29/21 2316   sodium chloride     methocarbamol (ROBAXIN) IV     promethazine (PHENERGAN) injection (IM or IVPB) 6.25 mg (12/27/21 2021)   TPN ADULT (ION) 40 mL/hr at 12/29/21 1722   TPN ADULT (ION)     PRN Meds:.  Antimicrobials: Anti-infectives (From admission, onward)    Start     Dose/Rate Route Frequency Ordered Stop   12/27/21 2130  ceFAZolin (ANCEF) IVPB 2g/100 mL premix        2 g 200 mL/hr over 30 Minutes Intravenous  Once 12/27/21 2039 12/27/21 2138   12/23/21 1400  cefoTEtan (CEFOTAN) 2 g in sodium chloride 0.9 % 100 mL IVPB        2 g 200 mL/hr over 30 Minutes Intravenous 60 min pre-op 12/23/21 1040 12/23/21 1444        I have personally reviewed the following labs and images: CBC: Recent Labs  Lab 12/25/21 0625 12/27/21 0511 12/28/21 0459 12/30/21 1237 12/30/21 1238  WBC 11.1* 10.5 10.4 9.9 9.9  NEUTROABS  --   --   --   --  7.8*  HGB 14.2 14.1 14.0 12.1* 12.1*  HCT 43.1 42.6 42.0 36.8* 37.9*  MCV 85.3 85.2 85.4 87.0 86.9  PLT 263 315 330 288 301   BMP &GFR Recent Labs  Lab 12/25/21 0625 12/27/21 0511 12/28/21 0459  12/28/21 0613 12/29/21 0220 12/30/21 0427  NA 136 138 141  --  139 138  K 4.4 3.7 4.6  --  3.6 3.6  CL 104 106 105  --  107 107  CO2 25 25 30   --  27 26  GLUCOSE 125* 121* 124*  --  121* 116*  BUN 10 16 16   --  17 15  CREATININE 0.97 0.93 0.94  --  0.83 0.70  CALCIUM 8.5* 8.5* 8.3*  --  7.9* 8.3*  MG 1.9 2.0  --  1.8 2.3 2.0  PHOS 2.9 2.6 4.3  --  3.0 3.2   Estimated Creatinine Clearance: 162.2 mL/min (by C-G formula based on SCr of 0.7 mg/dL). Liver & Pancreas: Recent Labs  Lab 12/25/21 0625 12/27/21 0511 12/28/21 0459 12/29/21 0220 12/30/21 0427  AST 33 18  --  32 36  ALT 27 18  --  18 25  ALKPHOS 64 60  --  44 56  BILITOT 1.1 0.9  --  1.0 1.9*  PROT 6.2* 6.1*  --  5.0* 5.7*  ALBUMIN 3.1* 2.8* 2.6* 2.3* 2.7*   No  results for input(s): "LIPASE", "AMYLASE" in the last 168 hours. No results for input(s): "AMMONIA" in the last 168 hours. Diabetic: No results for input(s): "HGBA1C" in the last 72 hours. Recent Labs  Lab 12/29/21 0512 12/29/21 1216 12/30/21 0014 12/30/21 0511 12/30/21 1127  GLUCAP 105* 107* 113* 110* 117*  117*   Cardiac Enzymes: No results for input(s): "CKTOTAL", "CKMB", "CKMBINDEX", "TROPONINI" in the last 168 hours. No results for input(s): "PROBNP" in the last 8760 hours. Coagulation Profile: No results for input(s): "INR", "PROTIME" in the last 168 hours. Thyroid Function Tests: No results for input(s): "TSH", "T4TOTAL", "FREET4", "T3FREE", "THYROIDAB" in the last 72 hours.   Lipid Profile: Recent Labs    12/29/21 0220 12/30/21 0427  TRIG 147 120   Anemia Panel: No results for input(s): "VITAMINB12", "FOLATE", "FERRITIN", "TIBC", "IRON", "RETICCTPCT" in the last 72 hours. Urine analysis:    Component Value Date/Time   COLORURINE YELLOW 12/21/2021 0631   APPEARANCEUR CLEAR 12/21/2021 0631   LABSPEC >1.046 (H) 12/21/2021 0631   PHURINE 5.0 12/21/2021 0631   GLUCOSEU NEGATIVE 12/21/2021 0631   HGBUR NEGATIVE 12/21/2021 0631    BILIRUBINUR NEGATIVE 12/21/2021 0631   KETONESUR NEGATIVE 12/21/2021 0631   PROTEINUR NEGATIVE 12/21/2021 0631   NITRITE NEGATIVE 12/21/2021 0631   LEUKOCYTESUR NEGATIVE 12/21/2021 0631   Sepsis Labs: Invalid input(s): "PROCALCITONIN", "LACTICIDVEN"  Microbiology: Recent Results (from the past 240 hour(s))  Resp Panel by RT-PCR (Flu A&B, Covid) Anterior Nasal Swab     Status: None   Collection Time: 12/21/21 12:29 AM   Specimen: Anterior Nasal Swab  Result Value Ref Range Status   SARS Coronavirus 2 by RT PCR NEGATIVE NEGATIVE Final    Comment: (NOTE) SARS-CoV-2 target nucleic acids are NOT DETECTED.  The SARS-CoV-2 RNA is generally detectable in upper respiratory specimens during the acute phase of infection. The lowest concentration of SARS-CoV-2 viral copies this assay can detect is 138 copies/mL. A negative result does not preclude SARS-Cov-2 infection and should not be used as the sole basis for treatment or other patient management decisions. A negative result may occur with  improper specimen collection/handling, submission of specimen other than nasopharyngeal swab, presence of viral mutation(s) within the areas targeted by this assay, and inadequate number of viral copies(<138 copies/mL). A negative result must be combined with clinical observations, patient history, and epidemiological information. The expected result is Negative.  Fact Sheet for Patients:  BloggerCourse.com  Fact Sheet for Healthcare Providers:  SeriousBroker.it  This test is no t yet approved or cleared by the [place] FDA and  has been authorized for detection and/or diagnosis of SARS-CoV-2 by FDA under an Emergency Use Authorization (EUA). This EUA will remain  in effect (meaning this test can be used) for the duration of the COVID-19 declaration under Section 564(b)(1) of the Act, 21 U.S.C.section 360bbb-3(b)(1), unless the authorization  is terminated  or revoked sooner.       Influenza A by PCR NEGATIVE NEGATIVE Final   Influenza B by PCR NEGATIVE NEGATIVE Final    Comment: (NOTE) The Xpert Xpress SARS-CoV-2/FLU/RSV plus assay is intended as an aid in the diagnosis of influenza from Nasopharyngeal swab specimens and should not be used as a sole basis for treatment. Nasal washings and aspirates are unacceptable for Xpert Xpress SARS-CoV-2/FLU/RSV testing.  Fact Sheet for Patients: BloggerCourse.com  Fact Sheet for Healthcare Providers: SeriousBroker.it  This test is not yet approved or cleared by the [place] FDA and has been authorized for detection and/or diagnosis  of SARS-CoV-2 by FDA under an Emergency Use Authorization (EUA). This EUA will remain in effect (meaning this test can be used) for the duration of the COVID-19 declaration under Section 564(b)(1) of the Act, 21 U.S.C. section 360bbb-3(b)(1), unless the authorization is terminated or revoked.  Performed at [facility], [address] [place], [place] [address]     Radiology Studies: DG Abd Portable 1V  Result Date: 12/30/2021 CLINICAL DATA:  NG tube placement EXAM: PORTABLE ABDOMEN - 1 VIEW COMPARISON:  Same-day KUB FINDINGS: The enteric catheter is coiled in the stomach with the tip projecting over the expected location of the pylorus/proximal duodenum. There is unchanged diffuse gaseous distention of the bowel. There is no definite free intraperitoneal air, within the confines of portable semi-erect technique. IMPRESSION: Enteric catheter coiled in the stomach with tip in the expected location of the pylorus/proximal duodenum. Electronically Signed   By: Lesia HausenPeter  Noone M.D.   On: 12/30/2021 11:45   DG Abd Portable 1V  Result Date: 12/30/2021 CLINICAL DATA:  Ileus following gastrointestinal surgery EXAM: PORTABLE ABDOMEN - 1 VIEW COMPARISON:  12/27/2021 FINDINGS:  Persistent diffuse gaseous distension of bowel in the abdomen, with increased small bowel distension in comparison to prior. Skin staples have been removed. IMPRESSION: Persistent diffuse gaseous distension of bowel, compatible with ileus, with increased small bowel distension in comparison to prior. Recommend continued follow-up. Electronically Signed   By: Caprice RenshawJacob  Kahn M.D.   On: 12/30/2021 09:07      Dymon Summerhill T. Daegon Deiss [facility]  If 7PM-7AM, please contact night-coverage [email or web address] 12/30/2021, 4:04 PM

## 2021-12-30 NOTE — Progress Notes (Addendum)
3 Days Post-Op   Subjective/Chief Complaint: NGT fell out during ambulation yesterday. He denies any nausea or increased abdominal pain since. Still no bowel function. In poorer spirits today - he is understandably discouraged by his length of stay in the hospital   Objective: Vital signs in last 24 hours: Temp:  [98.3 F (36.8 C)-100.8 F (38.2 C)] 100.8 F (38.2 C) (12/14 0600) Pulse Rate:  [93-109] 93 (12/14 0600) Resp:  [17-22] 17 (12/14 0412) BP: (124-130)/(75-83) 130/75 (12/14 0412) SpO2:  [96 %-100 %] 97 % (12/14 0412) FiO2 (%):  [0 %] 0 % (12/14 0407) Last BM Date : 12/27/21  Intake/Output from previous day: 12/13 0701 - 12/14 0700 In: 1813.5 [I.V.:1813.5] Out: 750 [Urine:750] Intake/Output this shift: No intake/output data recorded.  Abd - moderate distension and slightly worse from yesterday. Soft. Mild TTP over incision without peritonitis. Dressing c/d/I without drainage- dressing change by me this am. Wound base intact with appropriate bleeding and no drainage. Blister from prior adhesive along left lateral margin covered with gauze.   Lab Results:  Recent Labs    12/28/21 0459  WBC 10.4  HGB 14.0  HCT 42.0  PLT 330    BMET Recent Labs    12/29/21 0220 12/30/21 0427  NA 139 138  K 3.6 3.6  CL 107 107  CO2 27 26  GLUCOSE 121* 116*  BUN 17 15  CREATININE 0.83 0.70  CALCIUM 7.9* 8.3*    PT/INR No results for input(s): "LABPROT", "INR" in the last 72 hours. ABG No results for input(s): "PHART", "HCO3" in the last 72 hours.  Invalid input(s): "PCO2", "PO2"  Studies/Results: [place] EKG SITE RITE  Result Date: 12/28/2021 If Site Rite image not attached, placement could not be confirmed due to current cardiac rhythm.   Anti-infectives: Anti-infectives (From admission, onward)    Start     Dose/Rate Route Frequency Ordered Stop   12/27/21 2130  ceFAZolin (ANCEF) IVPB 2g/100 mL premix        2 g 200 mL/hr over 30 Minutes Intravenous  Once  12/27/21 2039 12/27/21 2138   12/23/21 1400  cefoTEtan (CEFOTAN) 2 g in sodium chloride 0.9 % 100 mL IVPB        2 g 200 mL/hr over 30 Minutes Intravenous 60 min pre-op 12/23/21 1040 12/23/21 1444       Assessment/Plan: Sigmoid volvulus POD 7 s/p sigmoid colectomy, mobilization of splenic flexure Dr. Dossie Der 12/7 POD 3 s/p exploratory laparotomy placement of retention sutures for fascial dehiscence Dr. 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 Can remain out for now but strict NPO and should be replaced if any nausea, worsening abdominal pain or distension. He does look slightly more distended to me this am and discussed with patient and RN low threshold for NG replacement. - t max 100.27F overnight. Will check cbc -  PICC/TPN - Continue PCA - BID wet to dry dressing changes to midline - tolerating well - abdominal binder - encouraged mobilization and IS use - have asked chaplain to see him for emotional support   FEN: NPO ID: [email or web address] periop, ancef periop VTE: lovenox Foley: place periop 12/11, dc 12/12, voiding   LOS: 8 days    Nathaniel Hanson, [facility] Surgery 12/30/2021, 7:26 AM Please see Amion for pager number during day hours 7:00am-4:30pm

## 2021-12-31 LAB — COMPREHENSIVE METABOLIC PANEL
ALT: 83 U/L — ABNORMAL HIGH (ref 0–44)
AST: 81 U/L — ABNORMAL HIGH (ref 15–41)
Albumin: 2.8 g/dL — ABNORMAL LOW (ref 3.5–5.0)
Alkaline Phosphatase: 76 U/L (ref 38–126)
Anion gap: 7 (ref 5–15)
BUN: 14 mg/dL (ref 6–20)
CO2: 26 mmol/L (ref 22–32)
Calcium: 8.7 mg/dL — ABNORMAL LOW (ref 8.9–10.3)
Chloride: 107 mmol/L (ref 98–111)
Creatinine, Ser: 0.64 mg/dL (ref 0.61–1.24)
GFR, Estimated: >60 mL/min (ref >60)
Glucose, Bld: 123 mg/dL — ABNORMAL HIGH (ref 70–99)
Potassium: 3.9 mmol/L (ref 3.5–5.1)
Sodium: 140 mmol/L (ref 135–145)
Total Bilirubin: 2.1 mg/dL — ABNORMAL HIGH (ref 0.3–1.2)
Total Protein: 6.4 g/dL — ABNORMAL LOW (ref 6.5–8.1)

## 2021-12-31 LAB — PHOSPHORUS: Phosphorus: 3.2 mg/dL (ref 2.5–4.6)

## 2021-12-31 LAB — GLUCOSE, CAPILLARY
Glucose-Capillary: 130 mg/dL — ABNORMAL HIGH (ref 70–99)
Glucose-Capillary: 114 mg/dL — ABNORMAL HIGH (ref 70–99)
Glucose-Capillary: 124 mg/dL — ABNORMAL HIGH (ref 70–99)

## 2021-12-31 LAB — MAGNESIUM: Magnesium: 2.1 mg/dL (ref 1.7–2.4)

## 2021-12-31 MED ORDER — SODIUM CHLORIDE 0.9 % IV SOLN: INTRAVENOUS | Status: DC

## 2021-12-31 MED ORDER — TRAVASOL 10 % IV SOLN
INTRAVENOUS | Status: AC
  Filled 2021-12-31: qty 1440

## 2021-12-31 NOTE — Progress Notes (Signed)
Nutrition Follow-up  INTERVENTION:   -TPN management per Pharmacy -Continue 100 mg Thiamine x 5 days (last dose 12/16)  - Monitor weights daily while on TPN.   NUTRITION DIAGNOSIS:   Inadequate oral intake related to altered GI function (ileus) as evidenced by NPO status, other (comment) (starting TPN).  Ongoing.  GOAL:   Patient will meet greater than or equal to 90% of their needs  Progressing with TPN.  MONITOR:   Labs, Weight trends, Diet advancement, TPN  ASSESSMENT:   41 y.o. 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  12/5: admitted 12/6: flex sigmoidoscopy, CLD 12/7: NPO for sigmoid colectomy and mobilization of splenic flexure, ->CLD 12/11: FLD -> NPO for ex lap, NGT placed 12/12: PICC placed, TPN initiated 12/13: NGT out 12/14: NGT replaced  TPN advancing to goal rate of 100 ml/hr today. Providing ~2400 kcals and 144g protein.  Pt continues to be NPO. NGT in place, set to LIS. Output: 300 ml.  Admission weight: 254 lbs Current weight: 250 lbs  Medications reviewed.  Labs reviewed: CBGs: 110-130   Diet Order:   Diet Order             Diet NPO time specified Except for: Ice Chips, Other (See Comments)  Diet effective now                   EDUCATION NEEDS:   Education needs have been addressed  Skin:  Skin Assessment: Skin Integrity Issues: Skin Integrity Issues:: Incisions Incisions: Abdomen  Last BM:  12/11  Height:   Ht Readings from Last 1 Encounters:  12/23/21 6\' 1"  (1.854 m)    Weight:   Wt Readings from Last 1 Encounters:  12/28/21 113.6 kg    Ideal Body Weight:  83.6 kg  BMI:  Body mass index is 33.05 kg/m.  Estimated Nutritional Needs:   Kcal:  2400-2750 kcals  Protein:  140-170 grams  Fluid:  >/= 2.1L  14/12/23, MS, RD, LDN Inpatient Clinical Dietitian Contact information available via Amion

## 2021-12-31 NOTE — Progress Notes (Signed)
Mobility Specialist - Progress Note   12/31/21 1325  Mobility  Activity Ambulated with assistance in hallway  Level of Assistance Modified independent, requires aide device or extra time  Assistive Device Front wheel walker  Distance Ambulated (ft) 375 ft  Activity Response Tolerated well  Mobility Referral Yes  $Mobility charge 1 Mobility   Pt received in chair and agreed to mobility, had no c/o pain nor discomfort during session. Pt has a slow gait, using RW to balance himself. Pt back in chair with all needs met.   Roderick Pee Mobility Specialist

## 2021-12-31 NOTE — Progress Notes (Signed)
PHARMACY - TOTAL PARENTERAL NUTRITION CONSULT NOTE   Indication: Prolonged ileus  Patient Measurements: Height: 6\' 1"  (185.4 cm) Weight: 113.6 kg (250 lb 8 oz) IBW/kg (Calculated) : 79.9 TPN AdjBW (KG): 88.9 Body mass index is 33.05 kg/m. Usual Weight:   Assessment: Patient is a 41 y.o M who presented to the ED on 12/21/21 for evaluation after he experienced syncope and blacked out at home.  Abdominal CT on 12/21/21 showed findings consistent with sigmoid volvulus.He underwent sigmoidoscopy on 12/22/21 with decompression of the volvulus. However, he still had significant sigmoid colon dilation s/p procedure and subsequently underwent sigmoid colectomy on 12/23/21. On 12/11, he had bleeding from surgical incision site as well as n/v and was found to have "fascial dehiscence and near evisceration of his bowel."  He was taken to the OR emergently on 12/27/21 for  exp lap with irrigation of the midline incision.  Abd x-ray on 12/27/21 showed findings consistent with post-op ileus. Pharmacy has been consulted to start TPN.   Glucose / Insulin: no hx DM - on sSSI q6h (no insulin given in the past 24 hrs) - goal cbgs <150: wnl Electrolytes: K 3.9; other lytes wnl including CorrCa - For ileus, goal for Mag > 2, K > 4, phos ~3 Renal: scr 0.64 (crcl~100) Hepatic:  - AST/ALT increased to 81/83; Tbili increased to 2.1; per pt's RN, no sign of jaundice noted - albumin low 2.8 - TG 120 (12/14) Intake / Output; MIVF: NS at 50 ml/hr - I/O: +125 mL - UOP: 950 mL GI Imaging: - 12/5 abd CT: Findings compatible with sigmoid volvulus. - 12/11 abd xray: Diffuse gaseous distention of bowel, compatible with postoperative ileus. - 12/14 abd xray: Persistent diffuse gaseous distension of bowel, compatible with ileus, with increased small bowel distension in comparison to prior. GI Surgeries / Procedures:  - 12/6 Flexible Sigmoidoscopy: Volvulus. Successful complete decompression achieved - 12/7: sigmoid  colectomy  - 12/11: exp lap with irrigation of midline incision - 12/11: NGT placed >> came out on 12/13   Central access: pending PICC placement  TPN start date: 12/28/21 pending PICC placement  Nutritional Goals: Goal TPN rate is 100 mL/hr (provides 144 g of protein and 2400 kcals per day)  - 12/6: started on CLD - 12/12: NPO  RD Assessment: - 10-23[birth year]1987, RD indicated that patient is at risk for refeeding syndrome and recom thiamine 100 mg for 5 days.  Estimated Needs Total Energy Estimated Needs: 2400-2750 kcals Total Protein Estimated Needs: 140-170 grams Total Fluid Estimated Needs: >/= 2.1L  Current Nutrition:  - 12/6: started on CLD - 12/12: NPO  - 12/12: started on TPN >>   Plan:   At 1800: -  Increase TPN to goal rate of 100 mL/hr  - Electrolytes in TPN:  Na 53mEq/L Cont K 60mEq/L Ca 41mEq/L Mg 38mEq/L Phos 15 mmol/L Cl:Ac 1:1 - Add standard MVI and trace elements to TPN - Add thiamine 100 mg to TPN (start on 12/12 - today is day #4/5) - Continue Sensitive q6h SSI and adjust as needed  - Reduce MIVF to 20 mL/hr at 1800 - Monitor TPN labs on Mon/Thurs - CMET, phos and mag on 12/16 and 12/17   1/18, PharmD, BCPS 12/31/2021 7:14 AM

## 2021-12-31 NOTE — Progress Notes (Signed)
PROGRESS NOTE  Nathaniel Hanson WIO:[id number] DOB: [birth year]   PCP: Creola Corn, MD  Patient is from: Home.  DOA: 12/21/2021 LOS: 9  Chief complaints Chief Complaint  Patient presents with   Syncopal Episode     Brief Narrative / Interim history: 41 year old M with no significant past medical history presenting with what looks like a syncopal episode and found to have sigmoid volvulus in ED.  General surgery and GI consulted.  Syncope workup unrevealing.  Patient underwent flex sigmoidoscopy on 12/6 but abdominal x-ray with persistent massive dilation of sigmoid colon.  Patient underwent sigmoid colectomy and mobilization of splenic flexure on 12/7 by Dr.  Dossie Der. Postoperative course complicated by ileus and fascial dehiscence requiring repeat ex lap on 12/27/2021 by Dr. Magnus Ivan. NG tube placed.  Now on TPN for nutrition and PCA Dilaudid for pain control.  General surgery following.  Subjective: Seen and examined earlier this morning.  No major events overnight of this morning.  No complaints other than some abdominal pain.  No nausea or vomiting.  NG tube in place.  No flatus or bowel movement yet.  Objective: Vitals:   12/31/21 0356 12/31/21 0553 12/31/21 1324 12/31/21 1600  BP:  126/79 132/84   Pulse:  (!) 101 100   Resp: (!) 23 20 20 14   Temp:  98.9 F (37.2 C) 98.4 F (36.9 C)   TempSrc:  Oral Oral   SpO2: 96% 95% 98%   Weight:      Height:        Examination:  GENERAL: No apparent distress.  Nontoxic. HEENT: MMM.  Vision and hearing grossly intact.  NECK: Supple.  No apparent JVD.  RESP:  No IWOB.  Fair aeration bilaterally. CVS:  RRR. Heart sounds normal.  ABD/GI/GU: BS diminished.  Abdominal binder and dressing in place. MSK/EXT:  Moves extremities. No apparent deformity. No edema.  SKIN: no apparent skin lesion or wound NEURO: Awake and alert. Oriented appropriately.  No apparent focal neuro deficit. PSYCH: Calm. Normal affect.   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 -Encourage mobilization and incentive spirometry  Vasovagal syncope: Likely due to the above.  Work-up including serial troponin, EKG, telemetry and echocardiogram reassuring.  No significant cardiopulmonary finding on CT chest.    Sinus tachycardia: Likely from dehydration and pain.  Resolved.  Hyponatremia: Likely from dehydration.  Resolved.  Nocturnal cough: Due to allergy or/and/GERD?  Seems to have resolved. -Continue PPI.  Mild fever: Spiked fever to 100.6 the night of 12/13.  No leukocytosis, respiratory or UTI symptoms.  No leukocytosis.  Abdominal exam reassuring. -Encourage incentive spirometry  Obesity/prolonged n.p.o. Body mass index is 33.05 kg/m. Nutrition Problem: Inadequate oral intake Etiology: altered GI function (ileus) Signs/Symptoms: NPO status, other (comment) (starting TPN) Interventions: Refer to RD note for recommendations, TPN, MVI   DVT prophylaxis:  enoxaparin (LOVENOX) injection 40 mg Start: 12/24/21 1000 SCDs Start: 12/22/21 0028  Code Status: Full code Family Communication: None at bedside today.  Level of care: Med-Surg Status  is: Inpatient The patient will remain inpatient because: Sigmoid volvulus and fascial dehiscence   Final disposition: TBD Consultants:  General surgery GI-signed off.  Sch Meds:   Scheduled Meds:  Chlorhexidine Gluconate Cloth  6 each Topical Daily   enoxaparin (LOVENOX) injection  40 mg Subcutaneous Q24H   insulin aspart  0-9 Units Subcutaneous Q6H   morphine   Intravenous Q4H   pantoprazole (PROTONIX) IV  40 mg Intravenous Q24H   sodium chloride flush  10-40 mL Intracatheter Q12H   sodium chloride flush  3 mL Intravenous Q12H   Continuous Infusions:  sodium chloride     sodium chloride     methocarbamol (ROBAXIN) IV     promethazine (PHENERGAN) injection (IM or IVPB) 6.25 mg (12/27/21 2021)   TPN ADULT (ION) 70 mL/hr at 12/30/21 1813   TPN ADULT (ION)     PRN Meds:.  Antimicrobials: Anti-infectives (From admission, onward)    Start     Dose/Rate Route Frequency Ordered Stop   12/27/21 2130  ceFAZolin (ANCEF) IVPB 2g/100 mL premix        2 g 200 mL/hr over 30 Minutes Intravenous  Once 12/27/21 2039 12/27/21 2138   12/23/21 1400  cefoTEtan (CEFOTAN) 2 g in sodium chloride 0.9 % 100 mL IVPB        2 g 200 mL/hr over 30 Minutes Intravenous 60 min pre-op 12/23/21 1040 12/23/21 1444        I have personally reviewed the following labs and images: CBC: Recent Labs  Lab 12/25/21 0625 12/27/21 0511 12/28/21 0459 12/30/21 1237 12/30/21 1238  WBC 11.1* 10.5 10.4 9.9 9.9  NEUTROABS  --   --   --   --  7.8*  HGB 14.2 14.1 14.0 12.1* 12.1*  HCT 43.1 42.6 42.0 36.8* 37.9*  MCV 85.3 85.2 85.4 87.0 86.9  PLT 263 315 330 288 301   BMP &GFR Recent Labs  Lab 12/27/21 0511 12/28/21 0459 12/28/21 0613 12/29/21 0220 12/30/21 0427 12/31/21 0343  NA 138 141  --  139 138 140  K 3.7 4.6  --  3.6 3.6 3.9  CL 106 105  --  107 107 107  CO2 25 30  --  27 26 26   GLUCOSE 121* 124*  --  121* 116* 123*  BUN 16 16  --  17 15 14   CREATININE 0.93 0.94  --  0.83 0.70 0.64  CALCIUM 8.5* 8.3*  --  7.9* 8.3* 8.7*  MG 2.0  --  1.8 2.3 2.0 2.1  PHOS 2.6 4.3  --  3.0 3.2 3.2   Estimated Creatinine Clearance: 162.2 mL/min (by C-G formula based on SCr of 0.64  mg/dL). Liver & Pancreas: Recent Labs  Lab 12/25/21 0625 12/27/21 0511 12/28/21 0459 12/29/21 0220 12/30/21 0427 12/31/21 0343  AST 33 18  --  32 36 81*  ALT 27 18  --  18 25 83*  ALKPHOS 64 60  --  44 56 76  BILITOT 1.1 0.9  --  1.0 1.9* 2.1*  PROT 6.2* 6.1*  --  5.0* 5.7* 6.4*  ALBUMIN 3.1* 2.8* 2.6* 2.3* 2.7* 2.8*   No results for input(s): "LIPASE", "AMYLASE" in the last 168 hours. No results for input(s): "AMMONIA" in the last 168 hours. Diabetic: No results for input(s): "HGBA1C" in the last 72 hours. Recent Labs  Lab 12/30/21 1127 12/30/21 1849 12/30/21 2329 12/31/21 0544 12/31/21 1214  GLUCAP 117*  117* 128* 113* 130* 114*   Cardiac Enzymes: No results for  input(s): "CKTOTAL", "CKMB", "CKMBINDEX", "TROPONINI" in the last 168 hours. No results for input(s): "PROBNP" in the last 8760 hours. Coagulation Profile: No results for input(s): "INR", "PROTIME" in the last 168 hours. Thyroid Function Tests: No results for input(s): "TSH", "T4TOTAL", "FREET4", "T3FREE", "THYROIDAB" in the last 72 hours.   Lipid Profile: Recent Labs    12/29/21 0220 12/30/21 0427  TRIG 147 120   Anemia Panel: No results for input(s): "VITAMINB12", "FOLATE", "FERRITIN", "TIBC", "IRON", "RETICCTPCT" in the last 72 hours. Urine analysis:    Component Value Date/Time   COLORURINE YELLOW 12/21/2021 0631   APPEARANCEUR CLEAR 12/21/2021 0631   LABSPEC >1.046 (H) 12/21/2021 0631   PHURINE 5.0 12/21/2021 0631   GLUCOSEU NEGATIVE 12/21/2021 0631   HGBUR NEGATIVE 12/21/2021 0631   BILIRUBINUR NEGATIVE 12/21/2021 0631   KETONESUR NEGATIVE 12/21/2021 0631   PROTEINUR NEGATIVE 12/21/2021 0631   NITRITE NEGATIVE 12/21/2021 0631   LEUKOCYTESUR NEGATIVE 12/21/2021 0631   Sepsis Labs: Invalid input(s): "PROCALCITONIN", "LACTICIDVEN"  Microbiology: No results found for this or any previous visit (from the past 240 hour(s)).   Radiology Studies: No results found.    Charlie Char T.  Kieryn Burtis [facility]  If 7PM-7AM, please contact night-coverage [email or web address] 12/31/2021, 4:12 PM

## 2021-12-31 NOTE — Progress Notes (Signed)
4 Days Post-Op   Subjective/Chief Complaint: NGT back in yesterday afternoon and less distended this am. No nausea. Ambulating. Still no bowel function   Objective: Vital signs in last 24 hours: Temp:  [98.9 F (37.2 C)-99.4 F (37.4 C)] 98.9 F (37.2 C) (12/15 0553) Pulse Rate:  [96-101] 101 (12/15 0553) Resp:  [16-23] 20 (12/15 0553) BP: (122-130)/(75-79) 126/79 (12/15 0553) SpO2:  [95 %-99 %] 95 % (12/15 0553) FiO2 (%):  [0 %] 0 % (12/14 2343) Last BM Date : 12/27/21  Intake/Output from previous day: 12/14 0701 - 12/15 0700 In: 1374.8 [P.O.:120; I.V.:1254.8] Out: 1250 [Urine:950; Emesis/NG output:300] Intake/Output this shift: No intake/output data recorded.  Abd - mild distension. Soft. Mild TTP over incision without peritonitis. NGT with dark bilious drainage. Dressing c/d/I without drainage- dressing change by me this am. Wound base intact with appropriate bleeding and no drainage. Blister from prior adhesive along left lateral margin covered with gauze.     Lab Results:  Recent Labs    12/30/21 1237 12/30/21 1238  WBC 9.9 9.9  HGB 12.1* 12.1*  HCT 36.8* 37.9*  PLT 288 301    BMET Recent Labs    12/30/21 0427 12/31/21 0343  NA 138 140  K 3.6 3.9  CL 107 107  CO2 26 26  GLUCOSE 116* 123*  BUN 15 14  CREATININE 0.70 0.64  CALCIUM 8.3* 8.7*    PT/INR No results for input(s): "LABPROT", "INR" in the last 72 hours. ABG No results for input(s): "PHART", "HCO3" in the last 72 hours.  Invalid input(s): "PCO2", "PO2"  Studies/Results: DG Abd Portable 1V  Result Date: 12/30/2021 CLINICAL DATA:  NG tube placement EXAM: PORTABLE ABDOMEN - 1 VIEW COMPARISON:  Same-day KUB FINDINGS: The enteric catheter is coiled in the stomach with the tip projecting over the expected location of the pylorus/proximal duodenum. There is unchanged diffuse gaseous distention of the bowel. There is no definite free intraperitoneal air, within the confines of portable  semi-erect technique. IMPRESSION: Enteric catheter coiled in the stomach with tip in the expected location of the pylorus/proximal duodenum. Electronically Signed   By: Lesia Hausen M.D.   On: 12/30/2021 11:45   DG Abd Portable 1V  Result Date: 12/30/2021 CLINICAL DATA:  Ileus following gastrointestinal surgery EXAM: PORTABLE ABDOMEN - 1 VIEW COMPARISON:  12/27/2021 FINDINGS: Persistent diffuse gaseous distension of bowel in the abdomen, with increased small bowel distension in comparison to prior. Skin staples have been removed. IMPRESSION: Persistent diffuse gaseous distension of bowel, compatible with ileus, with increased small bowel distension in comparison to prior. Recommend continued follow-up. Electronically Signed   By: Caprice Renshaw M.D.   On: 12/30/2021 09:07    Anti-infectives: Anti-infectives (From admission, onward)    Start     Dose/Rate Route Frequency Ordered Stop   12/27/21 2130  ceFAZolin (ANCEF) IVPB 2g/100 mL premix        2 g 200 mL/hr over 30 Minutes Intravenous  Once 12/27/21 2039 12/27/21 2138   12/23/21 1400  cefoTEtan (CEFOTAN) 2 g in sodium chloride 0.9 % 100 mL IVPB        2 g 200 mL/hr over 30 Minutes Intravenous 60 min pre-op 12/23/21 1040 12/23/21 1444       Assessment/Plan: Sigmoid volvulus POD 8 s/p sigmoid colectomy, mobilization of splenic flexure Dr. Dossie Der 12/7 POD 4 s/p exploratory laparotomy placement of retention sutures for fascial dehiscence Dr. 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 WBC normal yesterday -  PICC/TPN - Continue PCA - BID wet to dry dressing changes to midline - tolerating well - abdominal binder - encouraged mobilization and IS use   FEN: NPO, NGT LIWS, ice chips/popsicle ID: [email or web address] periop, ancef periop VTE: lovenox Foley: place periop 12/11, dc  12/12, voiding   LOS: 9 days    Eric Form, [facility] 12/31/2021, 9:35 AM Please see Amion for pager number during day hours 7:00am-4:30pm

## 2021-12-31 NOTE — Plan of Care (Signed)
Patient alert and oriented x 4. On PCA. Wound dressing on abdomen changed, binder in placed. TPN and IVF on going. NG tube in placed, on ILWS, draining to greenish output. VS monitored. Safety and fall precautions observed. Call bell within reach.  Problem: Education: Goal: Knowledge of condition and prescribed therapy will improve Outcome: Progressing   Problem: Cardiac: Goal: Will achieve and/or maintain adequate cardiac output Outcome: Progressing   Problem: Clinical Measurements: Goal: Ability to maintain clinical measurements within normal limits will improve Outcome: Progressing Goal: Will remain free from infection Outcome: Progressing Goal: Respiratory complications will improve Outcome: Progressing   Problem: Nutrition: Goal: Adequate nutrition will be maintained Outcome: Progressing   Problem: Pain Managment: Goal: General experience of comfort will improve Outcome: Progressing   Problem: Safety: Goal: Ability to remain free from injury will improve Outcome: Progressing   Problem: Respiratory: Goal: Respiratory status will improve Outcome: Progressing

## 2022-01-01 DIAGNOSIS — K56609 Unspecified intestinal obstruction, unspecified as to partial versus complete obstruction: Secondary | ICD-10-CM

## 2022-01-01 LAB — GLUCOSE, CAPILLARY
Glucose-Capillary: 119 mg/dL — ABNORMAL HIGH (ref 70–99)
Glucose-Capillary: 122 mg/dL — ABNORMAL HIGH (ref 70–99)
Glucose-Capillary: 145 mg/dL — ABNORMAL HIGH (ref 70–99)

## 2022-01-01 LAB — COMPREHENSIVE METABOLIC PANEL
ALT: 88 U/L — ABNORMAL HIGH (ref 0–44)
AST: 50 U/L — ABNORMAL HIGH (ref 15–41)
Albumin: 2.9 g/dL — ABNORMAL LOW (ref 3.5–5.0)
Alkaline Phosphatase: 96 U/L (ref 38–126)
Anion gap: 8 (ref 5–15)
BUN: 16 mg/dL (ref 6–20)
CO2: 26 mmol/L (ref 22–32)
Calcium: 8.7 mg/dL — ABNORMAL LOW (ref 8.9–10.3)
Chloride: 104 mmol/L (ref 98–111)
Creatinine, Ser: 0.75 mg/dL (ref 0.61–1.24)
GFR, Estimated: >60 mL/min (ref >60)
Glucose, Bld: 118 mg/dL — ABNORMAL HIGH (ref 70–99)
Potassium: 3.9 mmol/L (ref 3.5–5.1)
Sodium: 138 mmol/L (ref 135–145)
Total Bilirubin: 1.5 mg/dL — ABNORMAL HIGH (ref 0.3–1.2)
Total Protein: 6.5 g/dL (ref 6.5–8.1)

## 2022-01-01 LAB — MAGNESIUM: Magnesium: 2.4 mg/dL (ref 1.7–2.4)

## 2022-01-01 LAB — PHOSPHORUS: Phosphorus: 3.4 mg/dL (ref 2.5–4.6)

## 2022-01-01 MED ORDER — TRAVASOL 10 % IV SOLN
INTRAVENOUS | Status: AC
  Filled 2022-01-01: qty 1440

## 2022-01-01 NOTE — Progress Notes (Signed)
  Progress Note   Patient: Nathaniel Hanson NTI:[id number] DOB: [birth year] DOA: 12/21/2021     10 DOS: the patient was seen and examined on 01/01/2022   Brief hospital course: 41 year old M with no significant past medical history presenting with what looks like a syncopal episode and found to have sigmoid volvulus in ED.  General surgery and GI consulted.  Syncope workup unrevealing.  Patient underwent flex sigmoidoscopy on 12/6 but abdominal x-ray with persistent massive dilation of sigmoid colon.  Patient underwent sigmoid colectomy and mobilization of splenic flexure on 12/7 by Dr.  Dossie Der. Postoperative course complicated by ileus and fascial dehiscence requiring repeat ex lap on 12/27/2021 by Dr. Magnus Ivan. NG tube placed.  Now on TPN for nutrition and PCA Dilaudid for pain control.  General surgery following.  Assessment and Plan: Sigmoid volvulus: Incidental finding after presentation with possible syncope Postoperative ileus Fascial dehiscence -S/p flex sigmoidoscopy without success on 12/6 -S/p sigmoid colectomy in 12/7 -s/p emergent ex lap on 12/11 -General surgery managing-WTD twice daily, abdominal binder, NG tube, TPN and PCA for pain. -Encourage mobilization and incentive spirometry -Per gen surg, rec to continue NG today   Vasovagal syncope: Likely due to the above.  Work-up including serial troponin, EKG, telemetry and echocardiogram reassuring.  No significant cardiopulmonary finding on CT chest.     Sinus tachycardia: Likely from dehydration and pain.  Resolved.   Hyponatremia: Likely from dehydration.  Resolved.   Nocturnal cough: Due to allergy or/and/GERD?  Seems to have resolved. -Continue PPI.   Mild fever: Spiked fever to 100.6 the night of 12/13.  No leukocytosis, respiratory or UTI symptoms.  No leukocytosis.  Abdominal exam reassuring. -Encourage incentive spirometry   Obesity/prolonged n.p.o. Body mass index is 33.05 kg/m. Nutrition Problem:  Inadequate oral intake Etiology: altered GI function (ileus) Signs/Symptoms: NPO status, other (comment) (starting TPN) Interventions: Refer to RD note for recommendations, TPN, MVI     Subjective: Eager to have NG removed soon  Physical Exam: Vitals:   01/01/22 0545 01/01/22 0938 01/01/22 1504 01/01/22 1832  BP:  132/82 136/87 131/88  Pulse:  96 96 (!) 104  Resp: 18 18 20 18   Temp:  98.4 F (36.9 C) 98.5 F (36.9 C) 99.3 F (37.4 C)  TempSrc:  Oral Oral Oral  SpO2: 97% 98% 97% 99%  Weight:      Height:       General exam: Awake, laying in bed, in nad Respiratory system: Normal respiratory effort, no wheezing Cardiovascular system: regular rate, s1, s2 Gastrointestinal system: Soft, decreased BS, NG in place Central nervous system: CN2-12 grossly intact, strength intact Extremities: Perfused, no clubbing Skin: Normal skin turgor, no notable skin lesions seen Psychiatry: Mood normal // no visual hallucinations   Data Reviewed:  Labs reviewed: Na 138, K 3.9, Cr 0.75  Family Communication: Pt in room, family not at bedside  Disposition: Status is: Inpatient Remains inpatient appropriate because: Severity of illness  Planned Discharge Destination: Home    Author: , MD 01/01/2022 6:45 PM  For on call review [email or web address].

## 2022-01-01 NOTE — Hospital Course (Addendum)
40 year old male who presented to the hospital after sigmoid volvulus which caused syncope. He underwent a flex sigmoidoscopy on 12/6 without success. On 12/7, the patient underwent a sigmoid colectomy and mobilization of the splenic flexure.  His postop course has been complicated by a persistent ileus.  He was noted to have fascial dehiscence and return to the OR on 12/11 for placement of retention sutures.

## 2022-01-01 NOTE — Progress Notes (Signed)
PHARMACY - TOTAL PARENTERAL NUTRITION CONSULT NOTE   Indication: Prolonged ileus  Patient Measurements: Height: 6\' 1"  (185.4 cm) Weight: 113.6 kg (250 lb 8 oz) IBW/kg (Calculated) : 79.9 TPN AdjBW (KG): 88.9 Body mass index is 33.05 kg/m. Usual Weight:   Assessment: Patient is a 41 y.o M who presented to the ED on 12/21/21 for evaluation after he experienced syncope and blacked out at home.  Abdominal CT on 12/21/21 showed findings consistent with sigmoid volvulus.He underwent sigmoidoscopy on 12/22/21 with decompression of the volvulus. However, he still had significant sigmoid colon dilation s/p procedure and subsequently underwent sigmoid colectomy on 12/23/21. On 12/11, he had bleeding from surgical incision site as well as n/v and was found to have "fascial dehiscence and near evisceration of his bowel."  He was taken to the OR emergently on 12/27/21 for  exp lap with irrigation of the midline incision.  Abd x-ray on 12/27/21 showed findings consistent with post-op ileus. Pharmacy has been consulted to start TPN.  Glucose / Insulin: no hx DM - on sSSI q6h (2 units insulin given in the past 24 hrs) - goal cbgs <150: wnl Electrolytes: K 3.9; other lytes wnl including CorrCa - For ileus, goal for Mag > 2, K > 4, phos ~3 Renal: Scr <1, UOP unmeasured? Hepatic:  - AST down to 50 but ALT up to 88; Tbili 2.1>1.5; per pt's RN, no sign of jaundice noted - albumin low 2.9 - TG 120 (12/14) Intake / Output; MIVF: NS at 20 ml/hr - NG 06-30-1992 out, thick and bilious. Pt noted flatus - LBM 12/16 - UOP: unmeasured  GI Imaging: - 12/5 abd CT: Findings compatible with sigmoid volvulus. - 12/11 abd xray: Diffuse gaseous distention of bowel, compatible with postoperative ileus. - 12/14 abd xray: Persistent diffuse gaseous distension of bowel, compatible with ileus, with increased small bowel distension in comparison to prior.  GI Surgeries / Procedures:  - 12/6 Flexible Sigmoidoscopy: Volvulus.  Successful complete decompression achieved - 12/7: sigmoid colectomy  - 12/11: exp lap with irrigation of midline incision - 12/11: NGT placed >> came out on 12/13  Central access:  PICC placement  TPN start date: 12/28/21 PICC placement  Nutritional Goals: Goal TPN rate is 100 mL/hr (provides 144 g of protein and 2400 kcals per day)  - 12/6: started on CLD - 12/12: NPO  RD Assessment: - 10-23-1987, RD indicated that patient is at risk for refeeding syndrome and recom thiamine 100 mg for 5 days.  Estimated Needs Total Energy Estimated Needs: 2400-2750 kcals Total Protein Estimated Needs: 140-170 grams Total Fluid Estimated Needs: >/= 2.1L  Current Nutrition:  - 12/6: started on CLD - 12/12: NPO, started on TPN >> ice chips/popsicle   Plan:  No change to formula today TPN to goal rate of 100 mL/hr  - Electrolytes in TPN:  Na 21mEq/L Cont K 72mEq/L Ca 37mEq/L Mg 54mEq/L Phos 15 mmol/L Cl:Ac 1:1 - Add standard MVI and trace elements to TPN - Add thiamine 100 mg to TPN (start on 12/12 - today is day #5/5) - Continue Sensitive q6h SSI and adjust as needed  - MIVF at 20 mL/hr - Monitor TPN labs on Mon/Thurs and PRN   Merry [place]ond S. 14/12, PharmD, BCPS Clinical Staff Pharmacist [email or web address] 01/01/2022 8:29 AM

## 2022-01-01 NOTE — Progress Notes (Signed)
5 Days Post-Op   Subjective/Chief Complaint: Pt denies n/v.  States that he had an episode of flatus, "like a real one."  Pain is tolerable.  PCA doesn't appear to be reading end tidal CO2 correctly.     Objective: Vital signs in last 24 hours: Temp:  [98.4 F (36.9 C)-99 F (37.2 C)] 98.8 F (37.1 C) (12/16 0539) Pulse Rate:  [99-110] 110 (12/16 0539) Resp:  [14-20] 18 (12/16 0545) BP: (126-134)/(83-89) 134/89 (12/16 0539) SpO2:  [97 %-98 %] 97 % (12/16 0545) FiO2 (%):  [0 %] 0 % (12/16 0545) Last BM Date : 12/27/21  Intake/Output from previous day: 12/15 0701 - 12/16 0700 In: 1047.5 [I.V.:1047.5] Out: 1500 [Emesis/NG output:1500] Intake/Output this shift: No intake/output data recorded.  Abd - mild distension.  Abdominal binder in place.  Approp tender. Dressing not taken down this AM.  Appearance yesterday below Ext: tr edema.      Lab Results:  Recent Labs    12/30/21 1237 12/30/21 1238  WBC 9.9 9.9  HGB 12.1* 12.1*  HCT 36.8* 37.9*  PLT 288 301   BMET Recent Labs    12/31/21 0343 01/01/22 0427  NA 140 138  K 3.9 3.9  CL 107 104  CO2 26 26  GLUCOSE 123* 118*  BUN 14 16  CREATININE 0.64 0.75  CALCIUM 8.7* 8.7*   PT/INR No results for input(s): "LABPROT", "INR" in the last 72 hours. ABG No results for input(s): "PHART", "HCO3" in the last 72 hours.  Invalid input(s): "PCO2", "PO2"  Studies/Results: DG Abd Portable 1V  Result Date: 12/30/2021 CLINICAL DATA:  NG tube placement EXAM: PORTABLE ABDOMEN - 1 VIEW COMPARISON:  Same-day KUB FINDINGS: The enteric catheter is coiled in the stomach with the tip projecting over the expected location of the pylorus/proximal duodenum. There is unchanged diffuse gaseous distention of the bowel. There is no definite free intraperitoneal air, within the confines of portable semi-erect technique. IMPRESSION: Enteric catheter coiled in the stomach with tip in the expected location of the pylorus/proximal duodenum.  Electronically Signed   By: Lesia Hausen M.D.   On: 12/30/2021 11:45   DG Abd Portable 1V  Result Date: 12/30/2021 CLINICAL DATA:  Ileus following gastrointestinal surgery EXAM: PORTABLE ABDOMEN - 1 VIEW COMPARISON:  12/27/2021 FINDINGS: Persistent diffuse gaseous distension of bowel in the abdomen, with increased small bowel distension in comparison to prior. Skin staples have been removed. IMPRESSION: Persistent diffuse gaseous distension of bowel, compatible with ileus, with increased small bowel distension in comparison to prior. Recommend continued follow-up. Electronically Signed   By: Caprice Renshaw M.D.   On: 12/30/2021 09:07    Anti-infectives: Anti-infectives (From admission, onward)    Start     Dose/Rate Route Frequency Ordered Stop   12/27/21 2130  ceFAZolin (ANCEF) IVPB 2g/100 mL premix        2 g 200 mL/hr over 30 Minutes Intravenous  Once 12/27/21 2039 12/27/21 2138   12/23/21 1400  cefoTEtan (CEFOTAN) 2 g in sodium chloride 0.9 % 100 mL IVPB        2 g 200 mL/hr over 30 Minutes Intravenous 60 min pre-op 12/23/21 1040 12/23/21 1444       Assessment/Plan: Sigmoid volvulus POD 9 s/p sigmoid colectomy, mobilization of splenic flexure Dr. Dossie Der 12/7 POD 5 s/p exploratory laparotomy placement of retention sutures for fascial dehiscence Dr. 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  Had an episode of flatus this AM, but NGT output high and still thick and bilious.  Would not d/c ngt today.   - afebrile. WBC normal -  PICC/TPN - Continue PCA. Have asked nursing to see if a replacement machine can be found.   - BID wet to dry dressing changes to midline - tolerating well - abdominal binder - encouraged mobilization and IS use   FEN: NPO, NGT LIWS, ice chips/popsicle ID: [email or web address] periop, ancef periop VTE: lovenox Foley: place periop 12/11, dc 12/12,  voiding   LOS: 10 days    Almond Lint, MD [facility] 01/01/2022, 7:52 AM Please see Amion for pager number during day hours 7:00am-4:30pm

## 2022-01-02 LAB — GLUCOSE, CAPILLARY
Glucose-Capillary: 124 mg/dL — ABNORMAL HIGH (ref 70–99)
Glucose-Capillary: 129 mg/dL — ABNORMAL HIGH (ref 70–99)
Glucose-Capillary: 133 mg/dL — ABNORMAL HIGH (ref 70–99)
Glucose-Capillary: 142 mg/dL — ABNORMAL HIGH (ref 70–99)

## 2022-01-02 LAB — COMPREHENSIVE METABOLIC PANEL
ALT: 85 U/L — ABNORMAL HIGH (ref 0–44)
AST: 43 U/L — ABNORMAL HIGH (ref 15–41)
Albumin: 2.9 g/dL — ABNORMAL LOW (ref 3.5–5.0)
Alkaline Phosphatase: 108 U/L (ref 38–126)
Anion gap: 8 (ref 5–15)
BUN: 14 mg/dL (ref 6–20)
CO2: 25 mmol/L (ref 22–32)
Calcium: 8.9 mg/dL (ref 8.9–10.3)
Chloride: 105 mmol/L (ref 98–111)
Creatinine, Ser: 0.78 mg/dL (ref 0.61–1.24)
GFR, Estimated: >60 mL/min (ref >60)
Glucose, Bld: 131 mg/dL — ABNORMAL HIGH (ref 70–99)
Potassium: 4.3 mmol/L (ref 3.5–5.1)
Sodium: 138 mmol/L (ref 135–145)
Total Bilirubin: 1.7 mg/dL — ABNORMAL HIGH (ref 0.3–1.2)
Total Protein: 6.8 g/dL (ref 6.5–8.1)

## 2022-01-02 LAB — MAGNESIUM: Magnesium: 2.2 mg/dL (ref 1.7–2.4)

## 2022-01-02 LAB — PHOSPHORUS: Phosphorus: 4.1 mg/dL (ref 2.5–4.6)

## 2022-01-02 MED ORDER — TRAVASOL 10 % IV SOLN
INTRAVENOUS | Status: AC
  Filled 2022-01-02: qty 1440

## 2022-01-02 MED ORDER — MORPHINE SULFATE (PF) 2 MG/ML IV SOLN
1.0000 mg | INTRAVENOUS | Status: DC | PRN
  Administered 2022-01-02 – 2022-01-03 (×3): 2 mg via INTRAVENOUS
  Filled 2022-01-02 (×3): qty 1

## 2022-01-02 MED ORDER — ACETAMINOPHEN 650 MG RE SUPP: 325.0000 mg | Freq: Four times a day (QID) | RECTAL | Status: DC | PRN

## 2022-01-02 NOTE — Progress Notes (Signed)
Mobility Specialist - Progress Note   01/02/22 1224  Mobility  Activity Ambulated with assistance in hallway  Level of Assistance Modified independent, requires aide device or extra time  Assistive Device Front wheel walker  Distance Ambulated (ft) 260 ft  Activity Response Tolerated well  Mobility Referral Yes  $Mobility charge 1 Mobility   Pt received in recliner and agreeable to mobility. No complaints during mobility. Pt to recliner after session with all needs met.    [facility]

## 2022-01-02 NOTE — Progress Notes (Signed)
PCA discontinued per provider order at 0845. Patient was educated on new PRN medications available. 16 mL of morphine wasted in medication room with charge nurse Toniann Fail, RN present.   Smiley Houseman, RN

## 2022-01-02 NOTE — Progress Notes (Signed)
6 Days Post-Op   Subjective/Chief Complaint: Pt denies any appreciable flatus. No BMs. No n/v.     Objective: Vital signs in last 24 hours: Temp:  [98.4 F (36.9 C)-99.5 F (37.5 C)] 98.7 F (37.1 C) (12/17 0435) Pulse Rate:  [96-105] 102 (12/17 0435) Resp:  [16-21] 16 (12/17 0730) BP: (131-138)/(82-92) 131/87 (12/17 0435) SpO2:  [97 %-99 %] 97 % (12/17 0730) FiO2 (%):  [0 %] 0 % (12/17 0400) Last BM Date : 01/01/22  Intake/Output from previous day: 12/16 0701 - 12/17 0700 In: 3590.2 [I.V.:3590.2] Out: 4875 [Urine:3475; Emesis/NG output:1400] Intake/Output this shift: No intake/output data recorded.  Abd - mild distension.  Abdominal binder in place.  Approp tender. Slightly bloated today compared to yesterday.   Dressing not taken down this AM.   Ext: tr edema.     Lab Results:  Recent Labs    12/30/21 1237 12/30/21 1238  WBC 9.9 9.9  HGB 12.1* 12.1*  HCT 36.8* 37.9*  PLT 288 301   BMET Recent Labs    01/01/22 0427 01/02/22 0220  NA 138 138  K 3.9 4.3  CL 104 105  CO2 26 25  GLUCOSE 118* 131*  BUN 16 14  CREATININE 0.75 0.78  CALCIUM 8.7* 8.9   PT/INR No results for input(s): "LABPROT", "INR" in the last 72 hours. ABG No results for input(s): "PHART", "HCO3" in the last 72 hours.  Invalid input(s): "PCO2", "PO2"  Studies/Results: No results found.  Anti-infectives: Anti-infectives (From admission, onward)    Start     Dose/Rate Route Frequency Ordered Stop   12/27/21 2130  ceFAZolin (ANCEF) IVPB 2g/100 mL premix        2 g 200 mL/hr over 30 Minutes Intravenous  Once 12/27/21 2039 12/27/21 2138   12/23/21 1400  cefoTEtan (CEFOTAN) 2 g in sodium chloride 0.9 % 100 mL IVPB        2 g 200 mL/hr over 30 Minutes Intravenous 60 min pre-op 12/23/21 1040 12/23/21 1444       Assessment/Plan: Sigmoid volvulus POD 10 s/p sigmoid colectomy, mobilization of splenic flexure Dr. Dossie Der 12/7 POD 7 s/p exploratory laparotomy placement of  retention sutures for fascial dehiscence Dr. 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  Persistent post op ileus - afebrile. WBC has been normal -  PICC/TPN D/c PCA- switch to prn meds.  D/c continuous pulse ox also.   - BID wet to dry dressing changes to midline - tolerating well. Ok to go to qday.   - abdominal binder - encouraged mobilization and IS use   FEN: NPO, NGT LIWS, ice chips/popsicle ID: cefotetan periop, ancef periop VTE: lovenox Foley: place periop 12/11, dc 12/12, voiding   LOS: 11 days    Almond Lint, MD [facility] 01/02/2022, 8:13 AM Please see Amion for pager number during day hours 7:00am-4:30pm

## 2022-01-02 NOTE — Progress Notes (Signed)
  Progress Note   Patient: Nathaniel Hanson EHU:[id number] DOB: [birth year] DOA: 12/21/2021     11 DOS: the patient was seen and examined on 01/02/2022   Brief hospital course: 41 year old M with no significant past medical history presenting with what looks like a syncopal episode and found to have sigmoid volvulus in ED.  General surgery and GI consulted.  Syncope workup unrevealing.  Patient underwent flex sigmoidoscopy on 12/6 but abdominal x-ray with persistent massive dilation of sigmoid colon.  Patient underwent sigmoid colectomy and mobilization of splenic flexure on 12/7 by Dr.  Dossie Der. Postoperative course complicated by ileus and fascial dehiscence requiring repeat ex lap on 12/27/2021 by Dr. Magnus Ivan. NG tube placed.  Now on TPN for nutrition and PCA Dilaudid for pain control.  General surgery following.  Assessment and Plan: Sigmoid volvulus: Incidental finding after presentation with possible syncope Postoperative ileus Fascial dehiscence -S/p flex sigmoidoscopy without success on 12/6 -S/p sigmoid colectomy in 12/7 -s/p emergent ex lap on 12/11 -General surgery managing-WTD twice daily, abdominal binder, NG tube, TPN  -Analgesia weaned to PRN meds per surgery -Encourage mobilization and incentive spirometry -Per gen surg, rec to continue NG today   Vasovagal syncope: Likely due to the above.  Work-up including serial troponin, EKG, telemetry and echocardiogram reassuring.  No significant cardiopulmonary finding on CT chest.     Sinus tachycardia: Likely from dehydration and pain.  Resolved.   Hyponatremia: Likely from dehydration.  Resolved.   Nocturnal cough: Due to allergy or/and/GERD?  Seems to have resolved. -Continue PPI.   Mild fever: Spiked fever to 100.6 the night of 12/13.  No leukocytosis, respiratory or UTI symptoms.  No leukocytosis.  Abdominal exam reassuring. -Encourage incentive spirometry   Obesity/prolonged n.p.o. Body mass index is 33.05  kg/m. Nutrition Problem: Inadequate oral intake Etiology: altered GI function (ileus) Signs/Symptoms: NPO status, other (comment) (starting TPN) Interventions: Refer to RD note for recommendations, TPN, MVI     Subjective: complaining of increased "gas" discomfort this AM  Physical Exam: Vitals:   01/02/22 0730 01/02/22 1313 01/02/22 1511 01/02/22 1722  BP:  123/89 (!) 134/91 (!) 154/95  Pulse:  (!) 116 (!) 117 (!) 108  Resp: 16 20 20 20   Temp:  (!) 100.5 F (38.1 C) 99.5 F (37.5 C) 99.5 F (37.5 C)  TempSrc:  Oral Oral Oral  SpO2: 97% 94% 95% 94%  Weight:      Height:       General exam: Conversant, in no acute distress Respiratory system: normal chest rise, clear, no audible wheezing Cardiovascular system: regular rhythm, s1-s2 Gastrointestinal system: Nondistended, nontender, pos BS Central nervous system: No seizures, no tremors Extremities: No cyanosis, no joint deformities Skin: No rashes, no pallor Psychiatry: Affect normal // no auditory hallucinations   Data Reviewed:  Labs reviewed: Na 138, K 4.3, Cr 0.78  Family Communication: Pt in room, family not at bedside  Disposition: Status is: Inpatient Remains inpatient appropriate because: Severity of illness  Planned Discharge Destination: Home    Author: , MD 01/02/2022 5:59 PM  For on call review [email or web address].

## 2022-01-02 NOTE — Progress Notes (Signed)
Mobility Specialist - Progress Note   01/02/22 1622  Mobility  Activity Ambulated with assistance in hallway;Ambulated with assistance to bathroom  Level of Assistance Modified independent, requires aide device or extra time  Assistive Device Front wheel walker  Distance Ambulated (ft) 420 ft  Activity Response Tolerated well  Mobility Referral Yes  $Mobility charge 1 Mobility   Pt received in recliner and agreeable to mobility. Pt requested assistance to bathroom for BM. Nurse notified. No complaints during mobility. Pt to bathroom after session with all needs met & nurse in room.   [facility]

## 2022-01-02 NOTE — Progress Notes (Signed)
PHARMACY - TOTAL PARENTERAL NUTRITION CONSULT NOTE   Indication: Prolonged ileus  Patient Measurements: Height: 6\' 1"  (185.4 cm) Weight: 113.6 kg (250 lb 8 oz) IBW/kg (Calculated) : 79.9 TPN AdjBW (KG): 88.9 Body mass index is 33.05 kg/m. Usual Weight:   Assessment: Patient is a 41 y.o M who presented to the ED on 12/21/21 for evaluation after he experienced syncope and blacked out at home.  Abdominal CT on 12/21/21 showed findings consistent with sigmoid volvulus.He underwent sigmoidoscopy on 12/22/21 with decompression of the volvulus. However, he still had significant sigmoid colon dilation s/p procedure and subsequently underwent sigmoid colectomy on 12/23/21. On 12/11, he had bleeding from surgical incision site as well as n/v and was found to have "fascial dehiscence and near evisceration of his bowel."  He was taken to the OR emergently on 12/27/21 for  exp lap with irrigation of the midline incision.  Abd x-ray on 12/27/21 showed findings consistent with post-op ileus. Fascial dehiscence. Pharmacy has been consulted to start TPN.  Glucose / Insulin: no hx DM - on sSSI q6h (2 units insulin given in the past 24 hrs) - goal cbgs <150: wnl Electrolytes: Lytes wnl including CorrCa - For ileus, goal for Mag > 2, K > 4, phos ~3 Renal: Scr <1, UOP good Hepatic:  - AST down to 43 but ALT up to 85; Tbili 2.1>1.7; per pt's RN, no sign of jaundice noted - albumin low 2.9 - TG 120 (12/14) Intake / Output; MIVF: NS at 20 ml/hr - NG 06-30-1992 out, thick and bilious. Pt noted flatus - LBM 12/16 - UOP: 1/17  GI Imaging: - 12/5 abd CT: Findings compatible with sigmoid volvulus. - 12/11 abd xray: Diffuse gaseous distention of bowel, compatible with postoperative ileus. - 12/14 abd xray: Persistent diffuse gaseous distension of bowel, compatible with ileus, with increased small bowel distension in comparison to prior.  GI Surgeries / Procedures:  - 12/6 Flexible Sigmoidoscopy: Volvulus. Successful  complete decompression achieved - 12/7: sigmoid colectomy  - 12/11: exp lap with irrigation of midline incision - 12/11: NGT placed >> came out on 12/13  Central access:  PICC placement  TPN start date: 12/28/21 PICC placement  Nutritional Goals: Goal TPN rate is 100 mL/hr (provides 144 g of protein and 2400 kcals per day)  - 12/6: started on CLD - 12/12: NPO  RD Assessment: - 10-23-1987, RD indicated that patient is at risk for refeeding syndrome and recom thiamine 100 mg for 5 days.  Estimated Needs Total Energy Estimated Needs: 2400-2750 kcals Total Protein Estimated Needs: 140-170 grams Total Fluid Estimated Needs: >/= 2.1L  Current Nutrition:  - 12/6: started on CLD - 12/12: NPO, started on TPN >> ice chips/popsicle   Plan:  TPN to goal rate of 100 mL/hr  - Electrolytes in TPN:  Na 69mEq/L Cont K 78mEq/L Ca 55mEq/L Mg 48mEq/L Phos 15 mmol/L Cl:Ac 1:1 - Add standard MVI and trace elements to TPN - Add thiamine 100 mg to TPN (start on 12/12 - 12/17)-remove - Continue Sensitive q6h SSI and adjust as needed  - MIVF at 20 mL/hr - Monitor TPN labs on Mon/Thurs and PRN   Dollie Bressi S. 14/12, PharmD, BCPS Clinical Staff Pharmacist [email or web address] 01/02/2022 7:43 AM

## 2022-01-03 ENCOUNTER — Inpatient Hospital Stay (HOSPITAL_COMMUNITY): Payer: Self-pay

## 2022-01-03 ENCOUNTER — Encounter (HOSPITAL_COMMUNITY): Payer: Self-pay | Admitting: Surgery

## 2022-01-03 LAB — COMPREHENSIVE METABOLIC PANEL
ALT: 82 U/L — ABNORMAL HIGH (ref 0–44)
AST: 37 U/L (ref 15–41)
Albumin: 3.2 g/dL — ABNORMAL LOW (ref 3.5–5.0)
Alkaline Phosphatase: 122 U/L (ref 38–126)
Anion gap: 8 (ref 5–15)
BUN: 19 mg/dL (ref 6–20)
CO2: 25 mmol/L (ref 22–32)
Calcium: 9.3 mg/dL (ref 8.9–10.3)
Chloride: 104 mmol/L (ref 98–111)
Creatinine, Ser: 0.82 mg/dL (ref 0.61–1.24)
GFR, Estimated: >60 mL/min (ref >60)
Glucose, Bld: 155 mg/dL — ABNORMAL HIGH (ref 70–99)
Potassium: 4.4 mmol/L (ref 3.5–5.1)
Sodium: 137 mmol/L (ref 135–145)
Total Bilirubin: 2.2 mg/dL — ABNORMAL HIGH (ref 0.3–1.2)
Total Protein: 7.6 g/dL (ref 6.5–8.1)

## 2022-01-03 LAB — URINALYSIS, ROUTINE W REFLEX MICROSCOPIC
Bacteria, UA: NONE SEEN
Bilirubin Urine: NEGATIVE
Glucose, UA: NEGATIVE mg/dL
Ketones, ur: NEGATIVE mg/dL
Leukocytes,Ua: NEGATIVE
Nitrite: NEGATIVE
Protein, ur: 30 mg/dL — AB
Specific Gravity, Urine: 1.016 (ref 1.005–1.030)
pH: 5 (ref 5.0–8.0)

## 2022-01-03 LAB — TRIGLYCERIDES: Triglycerides: 142 mg/dL (ref <150)

## 2022-01-03 LAB — GLUCOSE, CAPILLARY
Glucose-Capillary: 125 mg/dL — ABNORMAL HIGH (ref 70–99)
Glucose-Capillary: 140 mg/dL — ABNORMAL HIGH (ref 70–99)
Glucose-Capillary: 143 mg/dL — ABNORMAL HIGH (ref 70–99)
Glucose-Capillary: 155 mg/dL — ABNORMAL HIGH (ref 70–99)
Glucose-Capillary: 156 mg/dL — ABNORMAL HIGH (ref 70–99)

## 2022-01-03 LAB — CBC
HCT: 40.5 % (ref 39.0–52.0)
Hemoglobin: 13.3 g/dL (ref 13.0–17.0)
MCH: 28.6 pg (ref 26.0–34.0)
MCHC: 32.8 g/dL (ref 30.0–36.0)
MCV: 87.1 fL (ref 80.0–100.0)
Platelets: 395 10*3/uL (ref 150–400)
RBC: 4.65 MIL/uL (ref 4.22–5.81)
RDW: 13.8 % (ref 11.5–15.5)
WBC: 13.4 10*3/uL — ABNORMAL HIGH (ref 4.0–10.5)
nRBC: 0 % (ref 0.0–0.2)

## 2022-01-03 LAB — MAGNESIUM: Magnesium: 2.5 mg/dL — ABNORMAL HIGH (ref 1.7–2.4)

## 2022-01-03 LAB — PHOSPHORUS: Phosphorus: 4.6 mg/dL (ref 2.5–4.6)

## 2022-01-03 MED ORDER — IOHEXOL 300 MG/ML  SOLN
100.0000 mL | Freq: Once | INTRAMUSCULAR | Status: AC | PRN
  Administered 2022-01-03: 100 mL via INTRAVENOUS

## 2022-01-03 MED ORDER — IOHEXOL 9 MG/ML PO SOLN
ORAL | Status: AC
  Administered 2022-01-03: 500 mL
  Filled 2022-01-03: qty 1000

## 2022-01-03 MED ORDER — SODIUM CHLORIDE (PF) 0.9 % IJ SOLN
INTRAMUSCULAR | Status: AC
  Filled 2022-01-03: qty 50

## 2022-01-03 MED ORDER — TRAVASOL 10 % IV SOLN
INTRAVENOUS | Status: AC
  Filled 2022-01-03: qty 1440

## 2022-01-03 MED ORDER — IOHEXOL 9 MG/ML PO SOLN
500.0000 mL | ORAL | Status: AC
  Administered 2022-01-03 (×2): 500 mL via ORAL

## 2022-01-03 NOTE — Progress Notes (Signed)
7 Days Post-Op   Subjective/Chief Complaint: Had a BM yesterday. Still not passing much flatus but no nausea or distension. Has been ambulating. Tube in his nose is most bothersome.   Objective: Vital signs in last 24 hours: Temp:  [97.8 F (36.6 C)-100.5 F (38.1 C)] 97.8 F (36.6 C) (12/18 0527) Pulse Rate:  [103-117] 105 (12/18 0527) Resp:  [16-20] 16 (12/18 0527) BP: (123-154)/(84-95) 131/90 (12/18 0527) SpO2:  [94 %-95 %] 94 % (12/18 0527) Last BM Date : 01/01/22  Intake/Output from previous day: 12/17 0701 - 12/18 0700 In: 0  Out: 2800 [Urine:2300; Emesis/NG output:500] Intake/Output this shift: No intake/output data recorded.  Abd - mild distension.  Abdominal binder in place.  Approp tender.  Dressing change by me this am as below Ext: tr edema.      Lab Results:  Recent Labs    01/03/22 0215  WBC 13.4*  HGB 13.3  HCT 40.5  PLT 395    BMET Recent Labs    01/02/22 0220 01/03/22 0215  NA 138 137  K 4.3 4.4  CL 105 104  CO2 25 25  GLUCOSE 131* 155*  BUN 14 19  CREATININE 0.78 0.82  CALCIUM 8.9 9.3    PT/INR No results for input(s): "LABPROT", "INR" in the last 72 hours. ABG No results for input(s): "PHART", "HCO3" in the last 72 hours.  Invalid input(s): "PCO2", "PO2"  Studies/Results: No results found.  Anti-infectives: Anti-infectives (From admission, onward)    Start     Dose/Rate Route Frequency Ordered Stop   12/27/21 2130  ceFAZolin (ANCEF) IVPB 2g/100 mL premix        2 g 200 mL/hr over 30 Minutes Intravenous  Once 12/27/21 2039 12/27/21 2138   12/23/21 1400  cefoTEtan (CEFOTAN) 2 g in sodium chloride 0.9 % 100 mL IVPB        2 g 200 mL/hr over 30 Minutes Intravenous 60 min pre-op 12/23/21 1040 12/23/21 1444       Assessment/Plan: Sigmoid volvulus POD 11 s/p sigmoid colectomy, mobilization of splenic flexure Dr. Dossie Der 12/7 POD 7 s/p exploratory laparotomy placement of retention sutures for fascial dehiscence Dr.  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 Clamp NGT this am and continue ice chips - t max 100.5/24h, WBC 13.4. will discuss with MD getting a CT scan today -  PICC/TPN - PCA d/c'd 12/18 - pain controlled - daily wet to dry dressing changes to midline - tolerating well - abdominal binder - encouraged mobilization and IS use   FEN: NPO, ice chips/popsicle. Clamp NGT ID: [email or web address] periop, ancef periop VTE: lovenox Foley: place periop 12/11, dc 12/12, voiding   LOS: 12 days    Eric Form, [facility] 01/03/2022, 7:34 AM Please see Amion for pager number during day hours 7:00am-4:30pm

## 2022-01-03 NOTE — Progress Notes (Signed)
PHARMACY - TOTAL PARENTERAL NUTRITION CONSULT NOTE   Indication: Prolonged ileus  Patient Measurements: Height: 6\' 1"  (185.4 cm) Weight: 113.6 kg (250 lb 8 oz) IBW/kg (Calculated) : 79.9 TPN AdjBW (KG): 88.9 Body mass index is 33.05 kg/m. Usual Weight:   Assessment: Patient is a 41 y.o M who presented to the ED on 12/21/21 for evaluation after he experienced syncope and blacked out at home.  Abdominal CT on 12/21/21 showed findings consistent with sigmoid volvulus.He underwent sigmoidoscopy on 12/22/21 with decompression of the volvulus. However, he still had significant sigmoid colon dilation s/p procedure and subsequently underwent sigmoid colectomy on 12/23/21. On 12/11, he had bleeding from surgical incision site as well as n/v and was found to have "fascial dehiscence and near evisceration of his bowel."  He was taken to the OR emergently on 12/27/21 for  exp lap with irrigation of the midline incision.  Abd x-ray on 12/27/21 showed findings consistent with post-op ileus. Fascial dehiscence. Pharmacy has been consulted to start TPN.  Glucose / Insulin: no hx DM - on sSSI q6h (5 units insulin given in the past 24 hrs) - goal cbgs <150 Electrolytes: Lytes wnl including CorrCa - For ileus, goal for Mag > 2, K > 4, phos ~3 Renal: Scr <1, UOP good Hepatic:  - AST down to 43 but ALT up to 82; Tbili 2.2; per pt's RN, no sign of jaundice noted - albumin 3.2 - TG 142 (12/18) Intake / Output; MIVF: NS at 20 ml/hr - NG 09-01-1995 out, thick and bilious. Pt noted flatus - LBM 12/17 - UOP: 1/18  GI Imaging: - 12/5 abd CT: Findings compatible with sigmoid volvulus. - 12/11 abd xray: Diffuse gaseous distention of bowel, compatible with postoperative ileus. - 12/14 abd xray: Persistent diffuse gaseous distension of bowel, compatible with ileus, with increased small bowel distension in comparison to prior.  GI Surgeries / Procedures:  - 12/6 Flexible Sigmoidoscopy: Volvulus. Successful complete  decompression achieved - 12/7: sigmoid colectomy  - 12/11: exp lap with irrigation of midline incision - 12/11: NGT placed >> came out on 12/13, replaced 12/14  Central access:  PICC placement  TPN start date: 12/28/21 PICC placement  Nutritional Goals: Goal TPN rate is 100 mL/hr (provides 144 g of protein and 2400 kcals per day)  - 12/6: started on CLD - 12/12: NPO  RD Assessment: - 10-23-1987, RD indicated that patient is at risk for refeeding syndrome and recom thiamine 100 mg for 5 days.  Estimated Needs Total Energy Estimated Needs: 2400-2750 kcals Total Protein Estimated Needs: 140-170 grams Total Fluid Estimated Needs: >/= 2.1L  Current Nutrition:  - 12/6: started on CLD - 12/12: NPO, started on TPN >> ice chips/popsicle   Plan:  TPN to goal rate of 100 mL/hr  - Electrolytes in TPN:  Na 34mEq/L K 95mEq/L (decrease) Ca 84mEq/L Mg 73mEq/L (decrease) Phos 10 mmol/L (decrease) Cl:Ac 1:1 - Add standard MVI and trace elements to TPN - Completed 5 days thiamine 100 mg in TPN (12/12 - 12/17) - Continue Sensitive q6h SSI and adjust as needed  - MIVF at 20 mL/hr - Monitor TPN labs on Mon/Thurs and PRN   12-24-1987, PharmD, BCPS Pharmacy: [phone] 01/03/2022 7:11 AM

## 2022-01-03 NOTE — Progress Notes (Signed)
Mobility Specialist - Progress Note   01/03/22 1249  Mobility  Activity Ambulated with assistance in hallway  Level of Assistance Standby assist, set-up cues, supervision of patient - no hands on  Assistive Device Front wheel walker  Distance Ambulated (ft) 450 ft  Activity Response Tolerated well  Mobility Referral Yes  $Mobility charge 1 Mobility   Pt received in chair and agreed to mobility, had no issues during session. Slow gait, pt returned to chair with all needs met.   Roderick Pee Mobility Specialist

## 2022-01-03 NOTE — Progress Notes (Signed)
Patient tried to remove NG tube himself. NG tube placed back to its original spot, hooked to suction.

## 2022-01-03 NOTE — Progress Notes (Signed)
  Progress Note   Patient: Nathaniel Hanson MWN:[id number] DOB: Dec 13, 1980 DOA: 12/21/2021     12 DOS: the patient was seen and examined on 01/03/2022   Brief hospital course: 41 year old M with no significant past medical history presenting with what looks like a syncopal episode and found to have sigmoid volvulus in ED.  General surgery and GI consulted.  Syncope workup unrevealing.  Patient underwent flex sigmoidoscopy on 12/6 but abdominal x-ray with persistent massive dilation of sigmoid colon.  Patient underwent sigmoid colectomy and mobilization of splenic flexure on 12/7 by Dr.  Dossie Der. Postoperative course complicated by ileus and fascial dehiscence requiring repeat ex lap on 12/27/2021 by Dr. Magnus Ivan. NG tube placed.  Now on TPN for nutrition and PCA Dilaudid for pain control.  General surgery following.  Assessment and Plan: Sigmoid volvulus: Incidental finding after presentation with possible syncope Postoperative ileus Fascial dehiscence -S/p flex sigmoidoscopy without success on 12/6 -S/p sigmoid colectomy in 12/7 -s/p emergent ex lap on 12/11 -General surgery managing-WTD twice daily, abdominal binder, NG tube, TPN  -Analgesia weaned to PRN meds per surgery -Encourage mobilization and incentive spirometry -Per gen surg, rec to continue NG today   Vasovagal syncope: Likely due to the above.  Work-up including serial troponin, EKG, telemetry and echocardiogram reassuring.  No significant cardiopulmonary finding on CT chest.     Sinus tachycardia: Likely from dehydration and pain.  Resolved.   Hyponatremia: Likely from dehydration.  Resolved.   Nocturnal cough: Due to allergy or/and/GERD?  Seems to have resolved. -Continue PPI.   Fever: Spiked fever to 100.6 the night of 12/13 and cont to be febrile. -Ordered and reviewed CXR, clear -UA reviewed, neg -blood cx pending -CT abd on 12/18 reviewed with 4.5 x 3.7cm loculated fluid collection noted. Gen Surg following    Obesity/prolonged n.p.o. Body mass index is 33.05 kg/m. Nutrition Problem: Inadequate oral intake Etiology: altered GI function (ileus) Signs/Symptoms: NPO status, other (comment) (starting TPN) Interventions: Refer to RD note for recommendations, TPN, MVI     Subjective: Reports some abd discomfort. Hoping to get NG out soon  Physical Exam: Vitals:   01/03/22 0113 01/03/22 0527 01/03/22 1523 01/03/22 1621  BP: 129/88 (!) 131/90 (!) 125/100 113/84  Pulse: (!) 103 (!) 105 (!) 117 (!) 121  Resp: 20 16 16 17   Temp: 100 F (37.8 C) 97.8 F (36.6 C) 97.8 F (36.6 C) 98.7 F (37.1 C)  TempSrc: Oral Oral  Oral  SpO2: 94% 94% 91% 95%  Weight:      Height:       General exam: Awake, laying in bed, in nad Respiratory system: Normal respiratory effort, no wheezing Cardiovascular system: regular rate, s1, s2 Gastrointestinal system: Soft, nondistended, decreased BS Central nervous system: CN2-12 grossly intact, strength intact Extremities: Perfused, no clubbing Skin: Normal skin turgor, no notable skin lesions seen Psychiatry: Mood normal // no visual hallucinations   Data Reviewed:  Labs reviewed: Na 137, K 4.4, Cr 0.82  Family Communication: Pt in room, family not at bedside  Disposition: Status is: Inpatient Remains inpatient appropriate because: Severity of illness  Planned Discharge Destination: Home    Author: , MD 01/03/2022 6:23 PM  For on call review [email or web address].

## 2022-01-03 NOTE — Progress Notes (Addendum)
NG tube clamped at 08:50 per surgery request. 1000cc output from NG tube.

## 2022-01-04 LAB — COMPREHENSIVE METABOLIC PANEL
ALT: 109 U/L — ABNORMAL HIGH (ref 0–44)
AST: 49 U/L — ABNORMAL HIGH (ref 15–41)
Albumin: 3.3 g/dL — ABNORMAL LOW (ref 3.5–5.0)
Alkaline Phosphatase: 134 U/L — ABNORMAL HIGH (ref 38–126)
Anion gap: 7 (ref 5–15)
BUN: 22 mg/dL — ABNORMAL HIGH (ref 6–20)
CO2: 25 mmol/L (ref 22–32)
Calcium: 9.6 mg/dL (ref 8.9–10.3)
Chloride: 109 mmol/L (ref 98–111)
Creatinine, Ser: 0.85 mg/dL (ref 0.61–1.24)
GFR, Estimated: >60 mL/min (ref >60)
Glucose, Bld: 155 mg/dL — ABNORMAL HIGH (ref 70–99)
Potassium: 4.6 mmol/L (ref 3.5–5.1)
Sodium: 141 mmol/L (ref 135–145)
Total Bilirubin: 1.9 mg/dL — ABNORMAL HIGH (ref 0.3–1.2)
Total Protein: 7.9 g/dL (ref 6.5–8.1)

## 2022-01-04 LAB — GLUCOSE, CAPILLARY
Glucose-Capillary: 144 mg/dL — ABNORMAL HIGH (ref 70–99)
Glucose-Capillary: 148 mg/dL — ABNORMAL HIGH (ref 70–99)
Glucose-Capillary: 151 mg/dL — ABNORMAL HIGH (ref 70–99)
Glucose-Capillary: 177 mg/dL — ABNORMAL HIGH (ref 70–99)

## 2022-01-04 LAB — MAGNESIUM: Magnesium: 2.3 mg/dL (ref 1.7–2.4)

## 2022-01-04 LAB — PHOSPHORUS: Phosphorus: 5 mg/dL — ABNORMAL HIGH (ref 2.5–4.6)

## 2022-01-04 MED ORDER — TRAVASOL 10 % IV SOLN
INTRAVENOUS | Status: AC
  Filled 2022-01-04: qty 1440

## 2022-01-04 MED ORDER — METOCLOPRAMIDE HCL 5 MG/ML IJ SOLN
10.0000 mg | Freq: Four times a day (QID) | INTRAMUSCULAR | Status: DC
  Administered 2022-01-04 – 2022-01-11 (×27): 10 mg via INTRAVENOUS
  Filled 2022-01-04 (×27): qty 2

## 2022-01-04 NOTE — Progress Notes (Signed)
Mobility Specialist - Progress Note   01/04/22 1118  Mobility  Activity Ambulated with assistance in hallway  Level of Assistance Modified independent, requires aide device or extra time  Assistive Device Front wheel walker  Distance Ambulated (ft) 460 ft  Activity Response Tolerated well  Mobility Referral Yes  $Mobility charge 1 Mobility   Pt received in bathroom and agreeable to mobility. No complaints during mobility. Pt to recliner after session with all needs met.    [facility]

## 2022-01-04 NOTE — Progress Notes (Signed)
8 Days Post-Op   Subjective/Chief Complaint: BM 2 days ago but none yesterday and still denies passing flatus. No abdominal pain, nausea, or worsening bloating with NGT clamped yesterday but did become tachycardic. Resumed to suction over night and unclear how much has come out. Has been ambulating. Pain controlled   Objective: Vital signs in last 24 hours: Temp:  [97.8 F (36.6 C)-98.7 F (37.1 C)] 98.6 F (37 C) (12/19 0602) Pulse Rate:  [109-121] 110 (12/19 0602) Resp:  [16-19] 18 (12/19 0602) BP: (113-133)/(84-100) 133/85 (12/19 0602) SpO2:  [91 %-96 %] 94 % (12/19 0602) Last BM Date : 12/20/21  Intake/Output from previous day: 12/18 0701 - 12/19 0700 In: 3098.3 [P.O.:240; I.V.:2858.3] Out: 700 [Urine:700] Intake/Output this shift: Total I/O In: -  Out: 350 [Urine:350]  Gen - laying comfortably in bed. NAD Resp - effort nonlabored on room air Abd - mild distension. Abdominal binder in place.  Approp tender.  Dressing change by me this am as below. Wound base intact Skin - warm and dry    Lab Results:  Recent Labs    01/03/22 0215  WBC 13.4*  HGB 13.3  HCT 40.5  PLT 395    BMET Recent Labs    01/03/22 0215 01/04/22 0505  NA 137 141  K 4.4 4.6  CL 104 109  CO2 25 25  GLUCOSE 155* 155*  BUN 19 22*  CREATININE 0.82 0.85  CALCIUM 9.3 9.6    PT/INR No results for input(s): "LABPROT", "INR" in the last 72 hours. ABG No results for input(s): "PHART", "HCO3" in the last 72 hours.  Invalid input(s): "PCO2", "PO2"  Studies/Results: CT ABDOMEN PELVIS W CONTRAST  Result Date: 01/03/2022 CLINICAL DATA:  abdominal pain, postoperative assessment in a 41 year old male presenting with distal colonic narrowing. Post sigmoid resection also post LEFT thoracotomy for fascial dehiscence. EXAM: CT ABDOMEN AND PELVIS WITH CONTRAST TECHNIQUE: Multidetector CT imaging of the abdomen and pelvis was performed using the standard protocol following bolus administration of  intravenous contrast. RADIATION DOSE REDUCTION: This exam was performed according to the departmental dose-optimization program which includes automated exposure control, adjustment of the mA and/or kV according to patient size and/or use of iterative reconstruction technique. CONTRAST:  OMNIPAQUE IOHEXOL 300 MG/ML  SOLN COMPARISON:  Chest December 5th 2023 CT evaluation also FINDINGS: Lower chest: Basilar volume loss.  No pleural effusion. Hepatobiliary: No focal, suspicious hepatic lesion. No pericholecystic stranding. No biliary duct dilation. Portal vein is patent. Mild hepatic steatosis. Pancreas: Normal, without mass, inflammation or ductal dilatation. Spleen: Normal. Adrenals/Urinary Tract: Adrenal glands are unremarkable. Symmetric renal enhancement. No sign of hydronephrosis. No suspicious renal lesion or perinephric stranding. Urinary bladder is grossly unremarkable. Stomach/Bowel: Gastric tube in place. Stomach largely decompressed. Small bowel without signs of obstruction. Mild stranding in the small bowel mesentery following recent surgery is nonspecific. No gross small bowel inflammation. Colon with distension proximal to the anastomotic site potentially due to mild ileus, not as dilated as on previous imaging. Appendix is normal. Anastomotic site in the LEFT upper pelvis. No adjacent gas. Scalloped margins of fluid in the pelvis raising the question of mild loculation but without peripheral enhancement. Density values less than 20 Hounsfield units. Density of 12 Hounsfield units. This fluid is increased over imaging from December 5th measuring 4.5 x 3.7 cm in the RIGHT pelvis flowing to the LEFT pelvis and up into the LEFT lower quadrant. Mild pericolonic stranding in under distension versus thickening of the splenic flexure of the  colon. No signs of pneumoperitoneum. Vascular/Lymphatic: Aorta with smooth contours. IVC with smooth contours. No aneurysmal dilation of the abdominal aorta. There is  no gastrohepatic or hepatoduodenal ligament lymphadenopathy. No retroperitoneal or mesenteric lymphadenopathy. No pelvic sidewall lymphadenopathy. Reproductive: Unremarkable by CT. Other: Fluid in the pelvis and LEFT lower quadrant. Musculoskeletal: Midline abdominal wound. Mild separation of rectus musculature near the umbilicus (image [id number]) no acute or destructive bone process. IMPRESSION: 1. Fluid potentially mildly loculated but without peripheral enhancement in the pelvis is nonspecific. Correlate with any signs of infection. This could also be due to underlying inflammation. 2. Mild pericolonic stranding in under distension versus thickening of the splenic flexure of the colon. Correlate with any signs of colitis. Would also correlate with lactate given location at the splenic flexure to exclude developing low flow ischemia or similar process. Findings are nonspecific and could also be related to recent surgical manipulation. 3. Mild stranding in the small bowel mesentery following recent surgery is nonspecific. No gross small bowel inflammation. 4. Midline abdominal wound. Mild separation of rectus musculature near the umbilicu, s attention on follow-up. 5. Mild hepatic steatosis. Electronically Signed   By: Zetta Bills M.D.   On: 01/03/2022 16:38   DG CHEST PORT 1 VIEW  Result Date: 01/03/2022 CLINICAL DATA:  Fever. EXAM: PORTABLE CHEST 1 VIEW COMPARISON:  12/21/2021 FINDINGS: Low volume film. Subsegmental atelectasis noted left base. Curvilinear opacity in the parahilar right lung is probably atelectatic. No focal consolidation or evidence of pulmonary edema. No substantial pleural effusion. Right PICC line and NG tube are new in the interval. IMPRESSION: 1. Low volume film with bilateral atelectasis. 2. New right PICC line and NG tube. Electronically Signed   By: Misty Stanley M.D.   On: 01/03/2022 10:11   DG Abd Portable 1V  Result Date: 01/03/2022 CLINICAL DATA:  Nasogastric tube placement.  EXAM: PORTABLE ABDOMEN - 1 VIEW COMPARISON:  Plain films of the abdomen dated 12/30/2021. FINDINGS: Nasogastric tube appears well positioned in the stomach with tip directed towards the stomach pylorus/duodenal bulb. The earlier small-bowel distention has resolved. Large bowel is of normal caliber. No evidence of free intraperitoneal air or abnormal fluid collection. No evidence of renal or ureteral stone. Visualized osseous structures are unremarkable. IMPRESSION: Nasogastric tube appears well positioned in the stomach with tip directed towards the stomach pylorus/duodenal bulb. Electronically Signed   By: Franki Cabot M.D.   On: 01/03/2022 08:49    Anti-infectives: Anti-infectives (From admission, onward)    Start     Dose/Rate Route Frequency Ordered Stop   12/27/21 2130  ceFAZolin (ANCEF) IVPB 2g/100 mL premix        2 g 200 mL/hr over 30 Minutes Intravenous  Once 12/27/21 2039 12/27/21 2138   12/23/21 1400  cefoTEtan (CEFOTAN) 2 g in sodium chloride 0.9 % 100 mL IVPB        2 g 200 mL/hr over 30 Minutes Intravenous 60 min pre-op 12/23/21 1040 12/23/21 1444       Assessment/Plan: Sigmoid volvulus POD 12 s/p sigmoid colectomy, mobilization of splenic flexure Dr. Thermon Leyland 12/7 POD 8 s/p exploratory laparotomy placement of retention sutures for fascial dehiscence Dr. 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 Resumed to suction overnight and unclear how much output - 800 ml was emptied from the cannister this am. Since he still  isnt passing flatus and no BM yesterday will repeat clamping trial today and allow CLD. Low threshold for resuming LIWS - afebrile, WBC pending. [facility] - CT 12/18 with fluid collection in pelvis not definitively abscess. Pericolonic stranding of the splenic flexure and SB mesentery.  Continue to monitor abdominal exam and CBC - PICC/TPN - PCA d/c'd 12/18 - pain controlled - daily wet to dry dressing changes to midline - tolerating well and wound base intact - abdominal binder - encouraged mobilization and IS use   FEN: clamp NGT, CLD, TPN ID: cefotetan periop, ancef periop VTE: lovenox Foley: place periop 12/11, dc 12/12, voiding   LOS: 13 days    Winferd Humphrey, [facility] 01/04/2022, 8:47 AM Please see Amion for pager number during day hours 7:00am-4:30pm

## 2022-01-04 NOTE — Progress Notes (Signed)
PHARMACY - TOTAL PARENTERAL NUTRITION CONSULT NOTE   Indication: Prolonged ileus  Patient Measurements: Height: _0  (185.4 cm) Weight: 113.6 kg (250 lb 8 oz) IBW/kg (Calculated) : 79.9 TPN AdjBW (KG): 88.9 Body mass index is 33.05 kg/m. Usual Weight:   Assessment: Patient is a 41 y.o M who presented to the ED on 12/21/21 for evaluation after he experienced syncope and blacked out at home.  Abdominal CT on 12/21/21 showed findings consistent with sigmoid volvulus.He underwent sigmoidoscopy on 12/22/21 with decompression of the volvulus. However, he still had significant sigmoid colon dilation s/p procedure and subsequently underwent sigmoid colectomy on 12/23/21. On 12/11, he had bleeding from surgical incision site as well as n/v and was found to have "fascial dehiscence and near evisceration of his bowel."  He was taken to the OR emergently on 12/27/21 for  exp lap with irrigation of the midline incision.  Abd x-ray on 12/27/21 showed findings consistent with post-op ileus. Fascial dehiscence. Pharmacy has been consulted to start TPN.  Glucose / Insulin: no hx DM - on sSSI q6h (7 units insulin given in the past 24 hrs) - goal cbgs <150 Electrolytes: K at upper end of goal, Phos elevated, CorrCa WNL - For ileus, goal for Mag > 2, K > 4, phos ~3 Renal: Scr <1, BUN sl elevated, UOP good Hepatic:  - AST sl up, ALT up to 109; Tbili 1.9; Alk Phos up 134 - albumin 3.3 - TG 142 (12/18) Intake / Output; MIVF: NS at 20 ml/hr ordered but not charted - NGT to low intermittent suction 12/18 - LBM 12/17 - UOP: 1257m  GI Imaging: - 12/5 abd CT: Findings compatible with sigmoid volvulus. - 12/11 abd xray: Diffuse gaseous distention of bowel, compatible with postoperative ileus. - 12/14 abd xray: Persistent diffuse gaseous distension of bowel, compatible with ileus, with increased small bowel distension in comparison to prior. - 12/18 CT:  GI Surgeries / Procedures:  - 12/6 Flexible  Sigmoidoscopy: Volvulus. Successful complete decompression achieved - 12/7: sigmoid colectomy  - 12/11: exp lap with irrigation of midline incision - 12/11: NGT placed >> came out on 12/13, replaced 12/14  Central access:  PICC placement  TPN start date: 12/28/21 PICC placement  Nutritional Goals: Goal TPN rate is 100 mL/hr (provides 144 g of protein and 2400 kcals per day)  - 12/6: started on CLD - 12/12: NPO  RD Assessment: - ASamson Hanson RD indicated that patient is at risk for refeeding syndrome and recom thiamine 100 mg for 5 days.  Estimated Needs Total Energy Estimated Needs: 28719-5974kcals Total Protein Estimated Needs: 140-170 grams Total Fluid Estimated Needs: >/= 2.1L  Current Nutrition:  - 12/6: started on CLD - 12/12: NPO, started on TPN >> ice chips/popsicle   Plan:  TPN to goal rate of 100 mL/hr  - Electrolytes in TPN:  Na 535m/L K 4552mL (decrease) Ca 2mE57m (decrease) Mg 2mEq51m Phos (remove) Cl:Ac 1:1 - Add standard MVI and trace elements to TPN - Completed 5 days thiamine 100 mg in TPN (12/12 - 12/17) - Continue Sensitive q6h SSI and adjust as needed  - MIVF at 20 mL/hr - Monitor TPN labs on Mon/Thurs and PRN   Felecia Stanfill Peggyann JubarmD, BCPS Pharmacy: [phone]/2023 6:49 AM

## 2022-01-04 NOTE — Progress Notes (Signed)
  Progress Note   Patient: Nathaniel Hanson BZJ:[id number] DOB: 17-Jan-1981 DOA: 12/21/2021     13 DOS: the patient was seen and examined on 01/04/2022   Brief hospital course: 41 year old M with no significant past medical history presenting with what looks like a syncopal episode and found to have sigmoid volvulus in ED.  General surgery and GI consulted.  Syncope workup unrevealing.  Patient underwent flex sigmoidoscopy on 12/6 but abdominal x-ray with persistent massive dilation of sigmoid colon.  Patient underwent sigmoid colectomy and mobilization of splenic flexure on 12/7 by Dr.  Dossie Der. Postoperative course complicated by ileus and fascial dehiscence requiring repeat ex lap on 12/27/2021 by Dr. Magnus Ivan. NG tube placed.  Now on TPN for nutrition and PCA Dilaudid for pain control.  General surgery following.  Assessment and Plan: Sigmoid volvulus: Incidental finding after presentation with possible syncope Postoperative ileus Fascial dehiscence -S/p flex sigmoidoscopy without success on 12/6 -S/p sigmoid colectomy in 12/7 -s/p emergent ex lap on 12/11 -General surgery managing, cont on TPN. To clamp NG today and trial of clears -Encourage mobilization and incentive spirometry   Vasovagal syncope: Likely due to the above.  Work-up including serial troponin, EKG, telemetry and echocardiogram reassuring.  No significant cardiopulmonary finding on CT chest.     Sinus tachycardia: Likely from dehydration and pain.  Resolved.   Hyponatremia: Likely from dehydration.  Resolved.   Nocturnal cough: Due to allergy or/and/GERD?  Seems to have resolved. -Continue PPI.   Fever: Spiked fever to 100.6 the night of 12/13 and cont to be febrile. -Ordered and reviewed CXR, clear -UA reviewed, neg -blood cx neg thus far -CT abd on 12/18 reviewed with 4.5 x 3.7cm loculated fluid collection noted, per Surgery, not abscess   Obesity/prolonged n.p.o. Body mass index is 33.05 kg/m. Nutrition  Problem: Inadequate oral intake Etiology: altered GI function (ileus) Signs/Symptoms: NPO status, other (comment) (starting TPN) Interventions: Refer to RD note for recommendations, TPN, MVI     Subjective: Looking forward to have NG removed soon  Physical Exam: Vitals:   01/03/22 1523 01/03/22 1621 01/03/22 1959 01/04/22 0602  BP: (!) 125/100 113/84 (!) 125/92 133/85  Pulse: (!) 117 (!) 121 (!) 109 (!) 110  Resp: 16 17 19 18   Temp: 97.8 F (36.6 C) 98.7 F (37.1 C) 98.2 F (36.8 C) 98.6 F (37 C)  TempSrc:  Oral    SpO2: 91% 95% 96% 94%  Weight:      Height:       General exam: Conversant, in no acute distress Respiratory system: normal chest rise, clear, no audible wheezing Cardiovascular system: regular rhythm, s1-s2 Gastrointestinal system: Nondistended, nontender, pos BS Central nervous system: No seizures, no tremors Extremities: No cyanosis, no joint deformities Skin: No rashes, no pallor Psychiatry: Affect normal // no auditory hallucinations   Data Reviewed:  Labs reviewed: Na 141, K 4.6, Cr 0.85  Family Communication: Pt in room, family not at bedside  Disposition: Status is: Inpatient Remains inpatient appropriate because: Severity of illness  Planned Discharge Destination: Home    Author: , MD 01/04/2022 3:24 PM  For on call review [email or web address].

## 2022-01-05 LAB — CBC
HCT: 41.3 % (ref 39.0–52.0)
Hemoglobin: 13.2 g/dL (ref 13.0–17.0)
MCH: 28.1 pg (ref 26.0–34.0)
MCHC: 32 g/dL (ref 30.0–36.0)
MCV: 87.9 fL (ref 80.0–100.0)
Platelets: 391 10*3/uL (ref 150–400)
RBC: 4.7 MIL/uL (ref 4.22–5.81)
RDW: 14 % (ref 11.5–15.5)
WBC: 14.6 10*3/uL — ABNORMAL HIGH (ref 4.0–10.5)
nRBC: 0 % (ref 0.0–0.2)

## 2022-01-05 LAB — COMPREHENSIVE METABOLIC PANEL
ALT: 138 U/L — ABNORMAL HIGH (ref 0–44)
AST: 58 U/L — ABNORMAL HIGH (ref 15–41)
Albumin: 3.2 g/dL — ABNORMAL LOW (ref 3.5–5.0)
Alkaline Phosphatase: 149 U/L — ABNORMAL HIGH (ref 38–126)
Anion gap: 9 (ref 5–15)
BUN: 22 mg/dL — ABNORMAL HIGH (ref 6–20)
CO2: 25 mmol/L (ref 22–32)
Calcium: 9.1 mg/dL (ref 8.9–10.3)
Chloride: 106 mmol/L (ref 98–111)
Creatinine, Ser: 0.99 mg/dL (ref 0.61–1.24)
GFR, Estimated: >60 mL/min (ref >60)
Glucose, Bld: 156 mg/dL — ABNORMAL HIGH (ref 70–99)
Potassium: 4.1 mmol/L (ref 3.5–5.1)
Sodium: 140 mmol/L (ref 135–145)
Total Bilirubin: 1.6 mg/dL — ABNORMAL HIGH (ref 0.3–1.2)
Total Protein: 7.9 g/dL (ref 6.5–8.1)

## 2022-01-05 LAB — PHOSPHORUS: Phosphorus: 4.2 mg/dL (ref 2.5–4.6)

## 2022-01-05 LAB — GLUCOSE, CAPILLARY
Glucose-Capillary: 139 mg/dL — ABNORMAL HIGH (ref 70–99)
Glucose-Capillary: 156 mg/dL — ABNORMAL HIGH (ref 70–99)
Glucose-Capillary: 157 mg/dL — ABNORMAL HIGH (ref 70–99)
Glucose-Capillary: 166 mg/dL — ABNORMAL HIGH (ref 70–99)

## 2022-01-05 LAB — MAGNESIUM: Magnesium: 2.3 mg/dL (ref 1.7–2.4)

## 2022-01-05 MED ORDER — TRAVASOL 10 % IV SOLN
INTRAVENOUS | Status: AC
  Filled 2022-01-05: qty 1440

## 2022-01-05 MED ORDER — INSULIN ASPART 100 UNIT/ML IJ SOLN
0.0000 [IU] | Freq: Four times a day (QID) | INTRAMUSCULAR | Status: DC
  Administered 2022-01-05 (×2): 3 [IU] via SUBCUTANEOUS
  Administered 2022-01-06 (×2): 2 [IU] via SUBCUTANEOUS

## 2022-01-05 NOTE — Progress Notes (Signed)
[facility] Progress Note  Patient: Nathaniel Hanson     EBR:[id number]  DOA: 12/21/2021   PCP: Creola Corn, MD       Brief hospital course: This is a 41 year old male who presented to the hospital after sigmoid volvulus which caused syncope. He underwent a flex sigmoidoscopy on 12/6 without success. On 12/7, the patient underwent a sigmoid colectomy and mobilization of the splenic flexure.  His postop course has been complicated by a persistent ileus.  He was noted to have fascial dehiscence and return to the OR on 12/11 for placement of retention sutures.  Currently he continues to have an ileus.  Subjective:  The patient has no complaints of pain.  No other complaints.  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 with an NG tube and on TPN - Continue management per general surgery  Active Problems: Mildly elevated LFTs - LFTs might be elevated in relation to his procedure-he has no prior history of elevation    Syncope -Cause unclear but suspected to be vasovagal  Hyponatremia, sinus tachycardia and dehydration - Resolved  Obesity Body mass index is 33.05 kg/m.    Code Status: Full Code Consultants: General surgery Level of Care: Level of care: Med-Surg Total time on patient care: 35 minutes DVT prophylaxis:  enoxaparin (LOVENOX) injection 40 mg Start: 12/24/21 1000 SCDs Start: 12/22/21 0028     Objective:   Vitals:   01/04/22 1533 01/04/22 1623 01/04/22 2121 01/05/22 0557  BP: (!) 116/96  134/88 129/82  Pulse: (!) 111 (!) 110 88 (!) 107  Resp: 20  16 20   Temp: 98.3 F (36.8 C)  98.9 F (37.2 C) 98.6 F (37 C)  TempSrc: Oral  Oral Oral  SpO2: 96% 98% 96% 96%  Weight:      Height:       Filed Weights   12/27/21 0500 12/28/21 0533 12/28/21 0915  Weight: 117.8 kg 119.4 kg 113.6 kg   Exam: General exam: Appears comfortable  HEENT: oral mucosa  moist Respiratory system: Clear to auscultation.  Cardiovascular system: S1 & S2 heard  Gastrointestinal system: Abdomen soft, mildly distended.  NG tube is present.  Poor bowel sounds Extremities: No cyanosis, clubbing or edema Psychiatry: Very flat affect     CBC: Recent Labs  Lab 12/30/21 1237 12/30/21 1238 01/03/22 0215 01/05/22 0503  WBC 9.9 9.9 13.4* 14.6*  NEUTROABS  --  7.8*  --   --   HGB 12.1* 12.1* 13.3 13.2  HCT 36.8* 37.9* 40.5 41.3  MCV 87.0 86.9 87.1 87.9  PLT 288 301 395 391   Basic Metabolic Panel: Recent Labs  Lab 01/01/22 0427 01/02/22 0220 01/03/22 0215 01/04/22 0505 01/05/22 0503  NA 138 138 137 141 140  K 3.9 4.3 4.4 4.6 4.1  CL 104 105 104 109 106  CO2 26 25 25 25 25   GLUCOSE 118* 131* 155* 155* 156*  BUN 16 14 19  22* 22*  CREATININE 0.75 0.78 0.82 0.85 0.99  CALCIUM 8.7* 8.9 9.3 9.6 9.1  MG 2.4 2.2 2.5* 2.3 2.3  PHOS 3.4 4.1 4.6 5.0* 4.2   GFR: Estimated Creatinine Clearance: 131 mL/min (by C-G formula based on SCr of 0.99 mg/dL).  Scheduled Meds:  Chlorhexidine Gluconate Cloth  6 each Topical Daily   enoxaparin (LOVENOX) injection  40 mg Subcutaneous Q24H   insulin aspart  0-15 Units Subcutaneous Q6H   metoCLOPramide (REGLAN) injection  10 mg  Intravenous Q6H   pantoprazole (PROTONIX) IV  40 mg Intravenous Q24H   sodium chloride flush  10-40 mL Intracatheter Q12H   sodium chloride flush  3 mL Intravenous Q12H   Continuous Infusions:  sodium chloride 20 mL/hr at 12/31/21 1639   methocarbamol (ROBAXIN) IV     promethazine (PHENERGAN) injection (IM or IVPB) 6.25 mg (12/27/21 2021)   TPN ADULT (ION) 100 mL/hr at 01/04/22 1734   TPN ADULT (ION)     Imaging and lab data was personally reviewed CT ABDOMEN PELVIS W CONTRAST  Result Date: 01/03/2022 CLINICAL DATA:  abdominal pain, postoperative assessment in a 41 year old male presenting with distal colonic narrowing. Post sigmoid resection also post LEFT thoracotomy for fascial  dehiscence. EXAM: CT ABDOMEN AND PELVIS WITH CONTRAST TECHNIQUE: Multidetector CT imaging of the abdomen and pelvis was performed using the standard protocol following bolus administration of intravenous contrast. RADIATION DOSE REDUCTION: This exam was performed according to the departmental dose-optimization program which includes automated exposure control, adjustment of the mA and/or kV according to patient size and/or use of iterative reconstruction technique. CONTRAST:  OMNIPAQUE IOHEXOL 300 MG/ML  SOLN COMPARISON:  Chest December 5th 2023 CT evaluation also FINDINGS: Lower chest: Basilar volume loss.  No pleural effusion. Hepatobiliary: No focal, suspicious hepatic lesion. No pericholecystic stranding. No biliary duct dilation. Portal vein is patent. Mild hepatic steatosis. Pancreas: Normal, without mass, inflammation or ductal dilatation. Spleen: Normal. Adrenals/Urinary Tract: Adrenal glands are unremarkable. Symmetric renal enhancement. No sign of hydronephrosis. No suspicious renal lesion or perinephric stranding. Urinary bladder is grossly unremarkable. Stomach/Bowel: Gastric tube in place. Stomach largely decompressed. Small bowel without signs of obstruction. Mild stranding in the small bowel mesentery following recent surgery is nonspecific. No gross small bowel inflammation. Colon with distension proximal to the anastomotic site potentially due to mild ileus, not as dilated as on previous imaging. Appendix is normal. Anastomotic site in the LEFT upper pelvis. No adjacent gas. Scalloped margins of fluid in the pelvis raising the question of mild loculation but without peripheral enhancement. Density values less than 20 Hounsfield units. Density of 12 Hounsfield units. This fluid is increased over imaging from December 5th measuring 4.5 x 3.7 cm in the RIGHT pelvis flowing to the LEFT pelvis and up into the LEFT lower quadrant. Mild pericolonic stranding in under distension versus thickening of  the splenic flexure of the colon. No signs of pneumoperitoneum. Vascular/Lymphatic: Aorta with smooth contours. IVC with smooth contours. No aneurysmal dilation of the abdominal aorta. There is no gastrohepatic or hepatoduodenal ligament lymphadenopathy. No retroperitoneal or mesenteric lymphadenopathy. No pelvic sidewall lymphadenopathy. Reproductive: Unremarkable by CT. Other: Fluid in the pelvis and LEFT lower quadrant. Musculoskeletal: Midline abdominal wound. Mild separation of rectus musculature near the umbilicus (image 72/2) no acute or destructive bone process. IMPRESSION: 1. Fluid potentially mildly loculated but without peripheral enhancement in the pelvis is nonspecific. Correlate with any signs of infection. This could also be due to underlying inflammation. 2. Mild pericolonic stranding in under distension versus thickening of the splenic flexure of the colon. Correlate with any signs of colitis. Would also correlate with lactate given location at the splenic flexure to exclude developing low flow ischemia or similar process. Findings are nonspecific and could also be related to recent surgical manipulation. 3. Mild stranding in the small bowel mesentery following recent surgery is nonspecific. No gross small bowel inflammation. 4. Midline abdominal wound. Mild separation of rectus musculature near the umbilicu, s attention on follow-up. 5. Mild hepatic steatosis.  Electronically Signed   By: Donzetta Kohut M.D.   On: 01/03/2022 16:38    LOS: 14 days   Author: Ladell Heads Britton Perkinson  01/05/2022 12:45 PM  To contact [facility]>   Check the care team in [facility] and look for the attending/consulting [facility] provider listed  Log into [email or web address] and use [facility]'s universal password   Go to> "[facility]"  and find provider  If you still have difficulty reaching the provider, please page the [facility] (Director on Call) for the Hospitalists listed on amion

## 2022-01-05 NOTE — Progress Notes (Signed)
Nutrition Follow-up  INTERVENTION:   -TPN management per Pharmacy  - Monitor weights daily while on TPN.    NUTRITION DIAGNOSIS:   Inadequate oral intake related to altered GI function (ileus) as evidenced by NPO status, other (comment) (starting TPN).  Ongoing.  GOAL:   Patient will meet greater than or equal to 90% of their needs  Meeting with TPN.  MONITOR:   Labs, Weight trends, Diet advancement, TPN   ASSESSMENT:   41 y.o. 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  12/5: admitted 12/6: flex sigmoidoscopy, CLD 12/7: NPO for sigmoid colectomy and mobilization of splenic flexure, ->CLD 12/11: FLD -> NPO for ex lap, NGT placed 12/12: PICC placed, TPN initiated 12/13: NGT out 12/14: NGT replaced  Continues to be NPO. NGT still in place, set to suction, output: 1L. TPN continues at goal rate of 100 ml/hr, providing ~2400 kcals and 144g protein.  Admission weight: 254 lbs Current weight: 250 lbs  Medications: Reglan,  Labs reviewed: CBGs: 125-177   Diet Order:   Diet Order             Diet NPO time specified Except for: Ice Chips, Other (See Comments)  Diet effective now                   EDUCATION NEEDS:   Education needs have been addressed  Skin:  Skin Assessment: Skin Integrity Issues: Skin Integrity Issues:: Incisions Incisions: Abdomen  Last BM:  12/19 -type 7  Height:   Ht Readings from Last 1 Encounters:  12/23/21 6\' 1"  (1.854 m)    Weight:   Wt Readings from Last 1 Encounters:  12/28/21 113.6 kg    Ideal Body Weight:  83.6 kg  BMI:  Body mass index is 33.05 kg/m.  Estimated Nutritional Needs:   Kcal:  2400-2750 kcals  Protein:  140-170 grams  Fluid:  >/= 2.1L   14/12/23, MS, RD, LDN Inpatient Clinical Dietitian Contact information available via Amion

## 2022-01-05 NOTE — Progress Notes (Signed)
9 Days Post-Op   Subjective/Chief Complaint: States he had a BM yesterday but still not passing flatus. NGT with thick bilious output and over 1 liter overnight. Did not feel nauseas with it clamped but did appear more distended. Pain well controlled   Objective: Vital signs in last 24 hours: Temp:  [98.3 F (36.8 C)-98.9 F (37.2 C)] 98.6 F (37 C) (12/20 0557) Pulse Rate:  [88-111] 107 (12/20 0557) Resp:  [16-20] 20 (12/20 0557) BP: (116-134)/(82-96) 129/82 (12/20 0557) SpO2:  [96 %-98 %] 96 % (12/20 0557) Last BM Date : 12/20/21  Intake/Output from previous day: 12/19 0701 - 12/20 0700 In: 960 [P.O.:960] Out: 2700 [Urine:900; Emesis/NG output:1800] Intake/Output this shift: Total I/O In: 0  Out: 300 [Urine:300]  Gen - laying comfortably in bed. NAD Resp - effort nonlabored on room air Abd - mild distension. Abdominal binder in place.  Approp tender.  Dressing changed by RN 0600 - c/d/i  NGT with dark bilious output - 1 L overnight Skin - warm and dry    Lab Results:  Recent Labs    01/03/22 0215 01/05/22 0503  WBC 13.4* 14.6*  HGB 13.3 13.2  HCT 40.5 41.3  PLT 395 391    BMET Recent Labs    01/04/22 0505 01/05/22 0503  NA 141 140  K 4.6 4.1  CL 109 106  CO2 25 25  GLUCOSE 155* 156*  BUN 22* 22*  CREATININE 0.85 0.99  CALCIUM 9.6 9.1    PT/INR No results for input(s): "LABPROT", "INR" in the last 72 hours. ABG No results for input(s): "PHART", "HCO3" in the last 72 hours.  Invalid input(s): "PCO2", "PO2"  Studies/Results: CT ABDOMEN PELVIS W CONTRAST  Result Date: 01/03/2022 CLINICAL DATA:  abdominal pain, postoperative assessment in a 41 year old male presenting with distal colonic narrowing. Post sigmoid resection also post LEFT thoracotomy for fascial dehiscence. EXAM: CT ABDOMEN AND PELVIS WITH CONTRAST TECHNIQUE: Multidetector CT imaging of the abdomen and pelvis was performed using the standard protocol following bolus administration of  intravenous contrast. RADIATION DOSE REDUCTION: This exam was performed according to the departmental dose-optimization program which includes automated exposure control, adjustment of the mA and/or kV according to patient size and/or use of iterative reconstruction technique. CONTRAST:  OMNIPAQUE IOHEXOL 300 MG/ML  SOLN COMPARISON:  Chest December 5th 2023 CT evaluation also FINDINGS: Lower chest: Basilar volume loss.  No pleural effusion. Hepatobiliary: No focal, suspicious hepatic lesion. No pericholecystic stranding. No biliary duct dilation. Portal vein is patent. Mild hepatic steatosis. Pancreas: Normal, without mass, inflammation or ductal dilatation. Spleen: Normal. Adrenals/Urinary Tract: Adrenal glands are unremarkable. Symmetric renal enhancement. No sign of hydronephrosis. No suspicious renal lesion or perinephric stranding. Urinary bladder is grossly unremarkable. Stomach/Bowel: Gastric tube in place. Stomach largely decompressed. Small bowel without signs of obstruction. Mild stranding in the small bowel mesentery following recent surgery is nonspecific. No gross small bowel inflammation. Colon with distension proximal to the anastomotic site potentially due to mild ileus, not as dilated as on previous imaging. Appendix is normal. Anastomotic site in the LEFT upper pelvis. No adjacent gas. Scalloped margins of fluid in the pelvis raising the question of mild loculation but without peripheral enhancement. Density values less than 20 Hounsfield units. Density of 12 Hounsfield units. This fluid is increased over imaging from December 5th measuring 4.5 x 3.7 cm in the RIGHT pelvis flowing to the LEFT pelvis and up into the LEFT lower quadrant. Mild pericolonic stranding in under distension versus thickening of  the splenic flexure of the colon. No signs of pneumoperitoneum. Vascular/Lymphatic: Aorta with smooth contours. IVC with smooth contours. No aneurysmal dilation of the abdominal aorta. There is  no gastrohepatic or hepatoduodenal ligament lymphadenopathy. No retroperitoneal or mesenteric lymphadenopathy. No pelvic sidewall lymphadenopathy. Reproductive: Unremarkable by CT. Other: Fluid in the pelvis and LEFT lower quadrant. Musculoskeletal: Midline abdominal wound. Mild separation of rectus musculature near the umbilicus (image 72/2) no acute or destructive bone process. IMPRESSION: 1. Fluid potentially mildly loculated but without peripheral enhancement in the pelvis is nonspecific. Correlate with any signs of infection. This could also be due to underlying inflammation. 2. Mild pericolonic stranding in under distension versus thickening of the splenic flexure of the colon. Correlate with any signs of colitis. Would also correlate with lactate given location at the splenic flexure to exclude developing low flow ischemia or similar process. Findings are nonspecific and could also be related to recent surgical manipulation. 3. Mild stranding in the small bowel mesentery following recent surgery is nonspecific. No gross small bowel inflammation. 4. Midline abdominal wound. Mild separation of rectus musculature near the umbilicu, s attention on follow-up. 5. Mild hepatic steatosis. Electronically Signed   By: Donzetta Kohut M.D.   On: 01/03/2022 16:38   DG CHEST PORT 1 VIEW  Result Date: 01/03/2022 CLINICAL DATA:  Fever. EXAM: PORTABLE CHEST 1 VIEW COMPARISON:  12/21/2021 FINDINGS: Low volume film. Subsegmental atelectasis noted left base. Curvilinear opacity in the parahilar right lung is probably atelectatic. No focal consolidation or evidence of pulmonary edema. No substantial pleural effusion. Right PICC line and NG tube are new in the interval. IMPRESSION: 1. Low volume film with bilateral atelectasis. 2. New right PICC line and NG tube. Electronically Signed   By: Kennith Center M.D.   On: 01/03/2022 10:11    Anti-infectives: Anti-infectives (From admission, onward)    Start     Dose/Rate Route  Frequency Ordered Stop   12/27/21 2130  ceFAZolin (ANCEF) IVPB 2g/100 mL premix        2 g 200 mL/hr over 30 Minutes Intravenous  Once 12/27/21 2039 12/27/21 2138   12/23/21 1400  cefoTEtan (CEFOTAN) 2 g in sodium chloride 0.9 % 100 mL IVPB        2 g 200 mL/hr over 30 Minutes Intravenous 60 min pre-op 12/23/21 1040 12/23/21 1444       Assessment/Plan: Sigmoid volvulus POD 13 s/p sigmoid colectomy, mobilization of splenic flexure Dr. Dossie Der 12/7 POD 9 s/p exploratory laparotomy placement of retention sutures for fascial dehiscence Dr. 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 Output remains high when LIWS resumed - 1 L overnight dark bilious and 100 ml already in cannister this am. Given output continue to LIWS today - started scheduled reglan yesterday - risks and benefits discussed with patient and he stated understanding and agreement to proceed - afebrile, WBC 14.6 BCX NGTD. monitor - CT 12/18 with fluid collection in pelvis not definitively abscess. Pericolonic stranding of the splenic flexure and SB mesentery. Continue to monitor abdominal exam and CBC - PICC/TPN - daily wet to dry dressing changes to midline - tolerating well and wound base intact - abdominal binder - encouraged mobilization and IS use   FEN: NGT LIWS, TPN ID: cefotetan periop, ancef periop VTE: lovenox Foley: place periop 12/11, dc 12/12, voiding  LOS: 14 days    Eric Form, [facility] 01/05/2022, 8:43 AM Please see Amion for pager number during day hours 7:00am-4:30pm

## 2022-01-05 NOTE — Plan of Care (Signed)
  Problem: Education: Goal: Knowledge of condition and prescribed therapy will improve Outcome: Progressing   Problem: Cardiac: Goal: Will achieve and/or maintain adequate cardiac output Outcome: Progressing   Problem: Physical Regulation: Goal: Complications related to the disease process, condition or treatment will be avoided or minimized Outcome: Progressing   Problem: Education: Goal: Knowledge of General Education information will improve Description: Including pain rating scale, medication(s)/side effects and non-pharmacologic comfort measures Outcome: Progressing   Problem: Health Behavior/Discharge Planning: Goal: Ability to manage health-related needs will improve Outcome: Progressing   Problem: Clinical Measurements: Goal: Ability to maintain clinical measurements within normal limits will improve Outcome: Progressing Goal: Will remain free from infection Outcome: Progressing Goal: Diagnostic test results will improve Outcome: Progressing Goal: Respiratory complications will improve Outcome: Progressing Goal: Cardiovascular complication will be avoided Outcome: Progressing   Problem: Activity: Goal: Risk for activity intolerance will decrease Outcome: Progressing   Problem: Nutrition: Goal: Adequate nutrition will be maintained Outcome: Progressing   Problem: Coping: Goal: Level of anxiety will decrease Outcome: Progressing   Problem: Elimination: Goal: Will not experience complications related to bowel motility Outcome: Progressing Goal: Will not experience complications related to urinary retention Outcome: Progressing   Problem: Pain Managment: Goal: General experience of comfort will improve Outcome: Progressing   Problem: Safety: Goal: Ability to remain free from injury will improve Outcome: Progressing   Problem: Skin Integrity: Goal: Risk for impaired skin integrity will decrease Outcome: Progressing   Problem: Education: Goal:  Understanding of discharge needs will improve Outcome: Progressing Goal: Verbalization of understanding of the causes of altered bowel function will improve Outcome: Progressing   Problem: Activity: Goal: Ability to tolerate increased activity will improve Outcome: Progressing   Problem: Bowel/Gastric: Goal: Gastrointestinal status for postoperative course will improve Outcome: Progressing   Problem: Health Behavior/Discharge Planning: Goal: Identification of community resources to assist with postoperative recovery needs will improve Outcome: Progressing   Problem: Nutritional: Goal: Will attain and maintain optimal nutritional status will improve Outcome: Progressing   Problem: Clinical Measurements: Goal: Postoperative complications will be avoided or minimized Outcome: Progressing   Problem: Respiratory: Goal: Respiratory status will improve Outcome: Progressing   Problem: Skin Integrity: Goal: Will show signs of wound healing Outcome: Progressing   

## 2022-01-05 NOTE — Progress Notes (Signed)
PHARMACY - TOTAL PARENTERAL NUTRITION CONSULT NOTE   Indication: Prolonged ileus  Patient Measurements: Height: _0  (185.4 cm) Weight: 113.6 kg (250 lb 8 oz) IBW/kg (Calculated) : 79.9 TPN AdjBW (KG): 88.9 Body mass index is 33.05 kg/m. Usual Weight:   Assessment: Patient is a 41 y.o M who presented to the ED on 12/21/21 for evaluation after he experienced syncope and blacked out at home.  Abdominal CT on 12/21/21 showed findings consistent with sigmoid volvulus.He underwent sigmoidoscopy on 12/22/21 with decompression of the volvulus. However, he still had significant sigmoid colon dilation s/p procedure and subsequently underwent sigmoid colectomy on 12/23/21. On 12/11, he had bleeding from surgical incision site as well as n/v and was found to have "fascial dehiscence and near evisceration of his bowel."  He was taken to the OR emergently on 12/27/21 for  exp lap with irrigation of the midline incision.  Abd x-ray on 12/27/21 showed findings consistent with post-op ileus. Fascial dehiscence. Pharmacy has been consulted to start TPN.  Glucose / Insulin: no hx DM - on sSSI q6h (7 units insulin given in the past 24 hrs) - goal cbgs <150 Electrolytes: WNL including CorrCa - For ileus, goal for Mag > 2, K > 4, phos ~3 Renal: Scr <1, BUN sl elevated, UOP good Hepatic:  - all rising, AST 58, ALT 138; Tbili 1.6, Alk Phos 149 - albumin 3.2 - TG 142 (12/18) Intake / Output; MIVF: NS at 20 ml/hr ordered but not charted - NGT clamped 12/19 - LBM 12/17, no flatus per MD note; reglan added 12/19 - UOP: 967m  GI Imaging: - 12/5 abd CT: Findings compatible with sigmoid volvulus. - 12/11 abd xray: Diffuse gaseous distention of bowel, compatible with postoperative ileus. - 12/14 abd xray: Persistent diffuse gaseous distension of bowel, compatible with ileus, with increased small bowel distension in comparison to prior. - 12/18 CT: fluid collection in pelvis not definitively abscess. Pericolonic  stranding of the splenic flexure and SB mesentery.   GI Surgeries / Procedures:  - 12/6 Flexible Sigmoidoscopy: Volvulus. Successful complete decompression achieved - 12/7: sigmoid colectomy  - 12/11: exp lap with irrigation of midline incision - 12/11: NGT placed >> came out on 12/13, replaced 12/14  Central access:  PICC placement  TPN start date: 12/28/21 PICC placement  Nutritional Goals: Goal TPN rate is 100 mL/hr (provides 144 g of protein and 2400 kcals per day)  RD Assessment: - ASamson Frederic RD indicated that patient is at risk for refeeding syndrome and recom thiamine 100 mg for 5 days.  Estimated Needs Total Energy Estimated Needs: 28811-[address]15kcals Total Protein Estimated Needs: 140-170 grams Total Fluid Estimated Needs: >/= 2.1L  Current Nutrition:  - 12/6: started on CLD - 12/12: NPO, started on TPN >> ice chips/popsicle   Plan:  TPN to goal rate of 100 mL/hr  - Electrolytes in TPN:  Na 540m/L K 4538mL  Ca 2mE41m  Mg 2mEq27m Phos 0 mmol/L  Cl:Ac 1:1 - Add standard MVI and trace elements to TPN - Completed 5 days thiamine 100 mg in TPN (12/12 - 12/17) - Continue q6h SSI change to moderate scale - MIVF at 20 mL/hr - Monitor TPN labs on Mon/Thurs and PRN  Aaryav Hopfensperger Peggyann JubarmD, BCPS Pharmacy: [phone]/2023 6:55 AM

## 2022-01-06 ENCOUNTER — Inpatient Hospital Stay (HOSPITAL_COMMUNITY): Payer: Self-pay

## 2022-01-06 LAB — GLUCOSE, CAPILLARY
Glucose-Capillary: 134 mg/dL — ABNORMAL HIGH (ref 70–99)
Glucose-Capillary: 152 mg/dL — ABNORMAL HIGH (ref 70–99)
Glucose-Capillary: 149 mg/dL — ABNORMAL HIGH (ref 70–99)
Glucose-Capillary: 137 mg/dL — ABNORMAL HIGH (ref 70–99)

## 2022-01-06 LAB — CBC
HCT: 42.3 % (ref 39.0–52.0)
Hemoglobin: 13.2 g/dL (ref 13.0–17.0)
MCH: 27.6 pg (ref 26.0–34.0)
MCHC: 31.2 g/dL (ref 30.0–36.0)
MCV: 88.5 fL (ref 80.0–100.0)
Platelets: 392 10*3/uL (ref 150–400)
RBC: 4.78 MIL/uL (ref 4.22–5.81)
RDW: 14.1 % (ref 11.5–15.5)
WBC: 13.6 10*3/uL — ABNORMAL HIGH (ref 4.0–10.5)
nRBC: 0 % (ref 0.0–0.2)

## 2022-01-06 LAB — HEMOGLOBIN A1C
Hgb A1c MFr Bld: 5.7 % — ABNORMAL HIGH (ref 4.8–5.6)
Mean Plasma Glucose: 117 mg/dL

## 2022-01-06 LAB — COMPREHENSIVE METABOLIC PANEL
ALT: 135 U/L — ABNORMAL HIGH (ref 0–44)
AST: 52 U/L — ABNORMAL HIGH (ref 15–41)
Albumin: 3.1 g/dL — ABNORMAL LOW (ref 3.5–5.0)
Alkaline Phosphatase: 136 U/L — ABNORMAL HIGH (ref 38–126)
Anion gap: 8 (ref 5–15)
BUN: 27 mg/dL — ABNORMAL HIGH (ref 6–20)
CO2: 25 mmol/L (ref 22–32)
Calcium: 9.4 mg/dL (ref 8.9–10.3)
Chloride: 110 mmol/L (ref 98–111)
Creatinine, Ser: 1.04 mg/dL (ref 0.61–1.24)
GFR, Estimated: >60 mL/min (ref >60)
Glucose, Bld: 131 mg/dL — ABNORMAL HIGH (ref 70–99)
Potassium: 4.2 mmol/L (ref 3.5–5.1)
Sodium: 143 mmol/L (ref 135–145)
Total Bilirubin: 1.6 mg/dL — ABNORMAL HIGH (ref 0.3–1.2)
Total Protein: 7.7 g/dL (ref 6.5–8.1)

## 2022-01-06 LAB — MAGNESIUM: Magnesium: 2.4 mg/dL (ref 1.7–2.4)

## 2022-01-06 LAB — TRIGLYCERIDES: Triglycerides: 134 mg/dL (ref <150)

## 2022-01-06 LAB — PHOSPHORUS: Phosphorus: 4 mg/dL (ref 2.5–4.6)

## 2022-01-06 MED ORDER — INSULIN ASPART 100 UNIT/ML IJ SOLN
0.0000 [IU] | INTRAMUSCULAR | Status: DC
  Administered 2022-01-06: 3 [IU] via SUBCUTANEOUS
  Administered 2022-01-06 (×2): 2 [IU] via SUBCUTANEOUS
  Administered 2022-01-07: 3 [IU] via SUBCUTANEOUS
  Administered 2022-01-07 (×2): 2 [IU] via SUBCUTANEOUS
  Administered 2022-01-07: 3 [IU] via SUBCUTANEOUS
  Administered 2022-01-07 – 2022-01-08 (×4): 2 [IU] via SUBCUTANEOUS
  Administered 2022-01-08: 3 [IU] via SUBCUTANEOUS
  Administered 2022-01-08 (×2): 2 [IU] via SUBCUTANEOUS
  Administered 2022-01-09 – 2022-01-10 (×8): 3 [IU] via SUBCUTANEOUS
  Administered 2022-01-10 (×3): 2 [IU] via SUBCUTANEOUS
  Administered 2022-01-10: 3 [IU] via SUBCUTANEOUS
  Administered 2022-01-11: 2 [IU] via SUBCUTANEOUS
  Administered 2022-01-11 – 2022-01-14 (×15): 3 [IU] via SUBCUTANEOUS
  Administered 2022-01-14 – 2022-01-16 (×9): 2 [IU] via SUBCUTANEOUS

## 2022-01-06 MED ORDER — BISACODYL 10 MG RE SUPP
10.0000 mg | Freq: Once | RECTAL | Status: DC
  Filled 2022-01-06: qty 1

## 2022-01-06 MED ORDER — TRAVASOL 10 % IV SOLN
INTRAVENOUS | Status: AC
  Filled 2022-01-06: qty 1440

## 2022-01-06 NOTE — Progress Notes (Signed)
[facility] Progress Note  Patient: Nathaniel Hanson     QGB:[id number]  DOA: 12/21/2021   PCP: Creola Corn, MD       Brief hospital course: This is a 41 year old male who presented to the hospital after sigmoid volvulus which caused syncope. He underwent a flex sigmoidoscopy on 12/6 without success. On 12/7, the patient underwent a sigmoid colectomy and mobilization of the splenic flexure.  His postop course has been complicated by a persistent ileus.  He was noted to have fascial dehiscence and return to the OR on 12/11 for placement of retention sutures.  Currently he continues to have an ileus.  Subjective:  He has no complaints today.  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 with an NG tube and on TPN - Continue management per general surgery  Active Problems: Mildly elevated LFTs - LFTs might be elevated in relation to his procedure-he has no prior history of elevation    Syncope -Cause unclear but suspected to be vasovagal  Hyponatremia, sinus tachycardia and dehydration - Resolved  Intolerance - Hemoglobin A1c 5.7 - Will need to discuss a carbohydrate modified diet when he is able to tolerate orals again  Obesity Body mass index is 33.05 kg/m.    Code Status: Full Code Consultants: General surgery Level of Care: Level of care: Med-Surg Total time on patient care: 35 minutes DVT prophylaxis:  enoxaparin (LOVENOX) injection 40 mg Start: 12/24/21 1000 SCDs Start: 12/22/21 0028     Objective:   Vitals:   01/05/22 1411 01/05/22 2034 01/06/22 0502 01/06/22 1155  BP: 135/81 (!) 138/101 (!) 147/92 (!) 126/90  Pulse: (!) 110 (!) 106 (!) 109 (!) 110  Resp: 16 18 20 18   Temp: (!) 97.5 F (36.4 C) 98.4 F (36.9 C) 98.4 F (36.9 C) 98 F (36.7 C)  TempSrc: Oral Oral Oral Oral  SpO2: 96% 95% 97% 98%  Weight:      Height:       Filed Weights   12/27/21  0500 12/28/21 0533 12/28/21 0915  Weight: 117.8 kg 119.4 kg 113.6 kg   Exam: General exam: Appears comfortable  HEENT: oral mucosa moist Respiratory system: Clear to auscultation.  Cardiovascular system: S1 & S2 heard  Gastrointestinal system: Abdomen soft, mildly distended.  NG tube is present.  Poor bowel sounds Extremities: No cyanosis, clubbing or edema Psychiatry: Very flat affect     CBC: Recent Labs  Lab 01/03/22 0215 01/05/22 0503 01/06/22 0408  WBC 13.4* 14.6* 13.6*  HGB 13.3 13.2 13.2  HCT 40.5 41.3 42.3  MCV 87.1 87.9 88.5  PLT 395 391 392    Basic Metabolic Panel: Recent Labs  Lab 01/02/22 0220 01/03/22 0215 01/04/22 0505 01/05/22 0503 01/06/22 0408  NA 138 137 141 140 143  K 4.3 4.4 4.6 4.1 4.2  CL 105 104 109 106 110  CO2 25 25 25 25 25   GLUCOSE 131* 155* 155* 156* 131*  BUN 14 19 22* 22* 27*  CREATININE 0.78 0.82 0.85 0.99 1.04  CALCIUM 8.9 9.3 9.6 9.1 9.4  MG 2.2 2.5* 2.3 2.3 2.4  PHOS 4.1 4.6 5.0* 4.2 4.0    GFR: Estimated Creatinine Clearance: 124.7 mL/min (by C-G formula based on SCr of 1.04 mg/dL).  Scheduled Meds:  bisacodyl  10 mg Rectal Once   Chlorhexidine Gluconate Cloth  6 each Topical Daily   enoxaparin (LOVENOX) injection  40 mg Subcutaneous Q24H  insulin aspart  0-15 Units Subcutaneous Q4H   metoCLOPramide (REGLAN) injection  10 mg Intravenous Q6H   pantoprazole (PROTONIX) IV  40 mg Intravenous Q24H   sodium chloride flush  10-40 mL Intracatheter Q12H   sodium chloride flush  3 mL Intravenous Q12H   Continuous Infusions:  sodium chloride 20 mL/hr at 12/31/21 1639   methocarbamol (ROBAXIN) IV     promethazine (PHENERGAN) injection (IM or IVPB) 6.25 mg (12/27/21 2021)   TPN ADULT (ION) 100 mL/hr at 01/05/22 1801   TPN ADULT (ION)     Imaging and lab data was personally reviewed DG Abd Portable 2V  Result Date: 01/06/2022 CLINICAL DATA:  Status post abdominal surgery, follow-up exam EXAM: PORTABLE ABDOMEN - 2 VIEW  COMPARISON:  CT 01/03/2022 FINDINGS: Nasogastric tube side port overlies the distal stomach, distal tip overlying the region of the proximal duodenum. There is mild gaseous distension of the colon. No evidence of bowel obstruction. Curvilinear bandlike opacity in the left lung base with underlying lucency. IMPRESSION: Curvilinear bandlike opacity and lucency in the region of the left hemidiaphragm/left lung base, favored to represent left basilar atelectasis with superimposed aerated basilar lung, as seen on recent CT. If there are peritoneal signs or if there is clinical concern for perforation, would recommend a CT. Nasogastric tube side port overlies the distal stomach, tip overlying the proximal duodenum. No evidence of bowel obstruction.  Gaseous distention of the colon. Electronically Signed   By: Caprice Renshaw M.D.   On: 01/06/2022 10:31    LOS: 15 days   Author: Calvert Cantor  01/06/2022 3:14 PM  To contact [facility]>   Check the care team in [facility] and look for the attending/consulting [facility] provider listed  Log into [email or web address] and use [facility] universal password   Go to> "[facility]"  and find provider  If you still have difficulty reaching the provider, please page the [facility] (Director on Call) for the Hospitalists listed on amion

## 2022-01-06 NOTE — Progress Notes (Signed)
PHARMACY - TOTAL PARENTERAL NUTRITION CONSULT NOTE   Indication: Prolonged ileus  Patient Measurements: Height: _0  (185.4 cm) Weight: 113.6 kg (250 lb 8 oz) IBW/kg (Calculated) : 79.9 TPN AdjBW (KG): 88.9 Body mass index is 33.05 kg/m. Usual Weight:   Assessment: Patient is a 42 y.o M who presented to the ED on 12/21/21 for evaluation after he experienced syncope and blacked out at home.  Abdominal CT on 12/21/21 showed findings consistent with sigmoid volvulus.He underwent sigmoidoscopy on 12/22/21 with decompression of the volvulus. However, he still had significant sigmoid colon dilation s/p procedure and subsequently underwent sigmoid colectomy on 12/23/21. On 12/11, he had bleeding from surgical incision site as well as n/v and was found to have "fascial dehiscence and near evisceration of his bowel."  He was taken to the OR emergently on 12/27/21 for  exp lap with irrigation of the midline incision.  Abd x-ray on 12/27/21 showed findings consistent with post-op ileus. Fascial dehiscence. Pharmacy has been consulted to start TPN.  Glucose / Insulin: no hx DM - on sSSI q6h (10 units insulin given in the past 24 hrs) - goal cbgs <150 Electrolytes: WNL including CorrCa - For ileus, goal for Mag > 2, K > 4, phos ~3 Renal: Scr slight trend up, BUN sl elevated, UOP good Hepatic:  - all rising but stable from yesterday, AST 52, ALT 135; Tbili 1.6, Alk Phos 136 - albumin 3.1 - TG 134 (12/21) Intake / Output; MIVF: NS at 20 ml/hr ordered but not charted - NGT at [facility] with dark bilious output, 2318m  - LBM 12/17, no flatus per MD note; reglan added 12/19 - UOP: 11029m GI Imaging: - 12/5 abd CT: Findings compatible with sigmoid volvulus. - 12/11 abd xray: Diffuse gaseous distention of bowel, compatible with postoperative ileus. - 12/14 abd xray: Persistent diffuse gaseous distension of bowel, compatible with ileus, with increased small bowel distension in comparison to prior. - 12/18 CT:  fluid collection in pelvis not definitively abscess. Pericolonic stranding of the splenic flexure and SB mesentery.   GI Surgeries / Procedures:  - 12/6 Flexible Sigmoidoscopy: Volvulus. Successful complete decompression achieved - 12/7: sigmoid colectomy  - 12/11: exp lap with irrigation of midline incision - 12/11: NGT placed >> came out on 12/13, replaced 12/14  Central access:  PICC placement  TPN start date: 12/28/21 PICC placement  Nutritional Goals: Goal TPN rate is 100 mL/hr (provides 144 g of protein and 2400 kcals per day)  RD Assessment: - AsSamson FredericRD indicated that patient is at risk for refeeding syndrome and recom thiamine 100 mg for 5 days.  Estimated Needs Total Energy Estimated Needs: 245701-7793cals Total Protein Estimated Needs: 140-170 grams Total Fluid Estimated Needs: >/= 2.1L  Current Nutrition:  - 12/6: started on CLD - 12/12: NPO, started on TPN >> ice chips/popsicle   Plan:  TPN to goal rate of 100 mL/hr  - Electrolytes in TPN:  Na 5022mL K 49m72m  Ca 2mEq67m Mg 2mEq/41mPhos 0 mmol/L  Cl:Ac 1:1 - Add standard MVI and trace elements to TPN - Completed 5 days thiamine 100 mg in TPN (12/12 - 12/17) - Change SSI to q4h, continue moderate scale - MIVF at 20 mL/hr - Monitor TPN labs on Mon/Thurs and PRN  Kory Panjwani WPeggyann JubamD, BCPS Pharmacy: [phone]/2023 7:04 AM

## 2022-01-06 NOTE — Plan of Care (Signed)
  Problem: Education: Goal: Knowledge of condition and prescribed therapy will improve Outcome: Progressing   Problem: Cardiac: Goal: Will achieve and/or maintain adequate cardiac output Outcome: Progressing   Problem: Physical Regulation: Goal: Complications related to the disease process, condition or treatment will be avoided or minimized Outcome: Progressing   Problem: Education: Goal: Knowledge of General Education information will improve Description: Including pain rating scale, medication(s)/side effects and non-pharmacologic comfort measures Outcome: Progressing   Problem: Health Behavior/Discharge Planning: Goal: Ability to manage health-related needs will improve Outcome: Progressing   Problem: Clinical Measurements: Goal: Ability to maintain clinical measurements within normal limits will improve Outcome: Progressing Goal: Will remain free from infection Outcome: Progressing Goal: Diagnostic test results will improve Outcome: Progressing Goal: Respiratory complications will improve Outcome: Progressing Goal: Cardiovascular complication will be avoided Outcome: Progressing   Problem: Activity: Goal: Risk for activity intolerance will decrease Outcome: Progressing   Problem: Nutrition: Goal: Adequate nutrition will be maintained Outcome: Progressing   Problem: Coping: Goal: Level of anxiety will decrease Outcome: Progressing   Problem: Elimination: Goal: Will not experience complications related to bowel motility Outcome: Progressing Goal: Will not experience complications related to urinary retention Outcome: Progressing   Problem: Pain Managment: Goal: General experience of comfort will improve Outcome: Progressing   Problem: Safety: Goal: Ability to remain free from injury will improve Outcome: Progressing   Problem: Skin Integrity: Goal: Risk for impaired skin integrity will decrease Outcome: Progressing   Problem: Education: Goal:  Understanding of discharge needs will improve Outcome: Progressing Goal: Verbalization of understanding of the causes of altered bowel function will improve Outcome: Progressing   Problem: Activity: Goal: Ability to tolerate increased activity will improve Outcome: Progressing   Problem: Bowel/Gastric: Goal: Gastrointestinal status for postoperative course will improve Outcome: Progressing   Problem: Health Behavior/Discharge Planning: Goal: Identification of community resources to assist with postoperative recovery needs will improve Outcome: Progressing   Problem: Nutritional: Goal: Will attain and maintain optimal nutritional status will improve Outcome: Progressing   Problem: Clinical Measurements: Goal: Postoperative complications will be avoided or minimized Outcome: Progressing   Problem: Respiratory: Goal: Respiratory status will improve Outcome: Progressing   Problem: Skin Integrity: Goal: Will show signs of wound healing Outcome: Progressing   

## 2022-01-06 NOTE — Progress Notes (Signed)
   01/06/22 1115  Clinical Encounter Type  Visited With Patient  Visit Type Social support;Spiritual support   Very brief visit while rounding; pt. sitting in recliner; he says medical team say he is making progress, but he is unsure when he will be able to go home.  No needs expressed at this time.

## 2022-01-06 NOTE — Progress Notes (Addendum)
10 Days Post-Op   Subjective/Chief Complaint: No flatus or BM yesterday/today. Symptoms overall stable - pain well controlled, no nausea/emesis/worsening bloating. ambulating  Objective: Vital signs in last 24 hours: Temp:  [97.5 F (36.4 C)-98.4 F (36.9 C)] 98.4 F (36.9 C) (12/21 0502) Pulse Rate:  [106-110] 109 (12/21 0502) Resp:  [16-20] 20 (12/21 0502) BP: (135-147)/(81-101) 147/92 (12/21 0502) SpO2:  [95 %-97 %] 97 % (12/21 0502) Last BM Date : 01/04/22  Intake/Output from previous day: 12/20 0701 - 12/21 0700 In: 10 [I.V.:10] Out: 2400 [Urine:1100; Emesis/NG output:1300] Intake/Output this shift: No intake/output data recorded.  Gen - sitting up in chair. NAD Resp - effort nonlabored on room air Abd - mild distension. Abdominal binder in place.  Approp tender.  Dressing changed by RN 0530 - c/d/i  NGT with light clear bilious output - 1.3 L/24hr documented  Skin - warm and dry    Lab Results:  Recent Labs    01/05/22 0503 01/06/22 0408  WBC 14.6* 13.6*  HGB 13.2 13.2  HCT 41.3 42.3  PLT 391 392    BMET Recent Labs    01/05/22 0503 01/06/22 0408  NA 140 143  K 4.1 4.2  CL 106 110  CO2 25 25  GLUCOSE 156* 131*  BUN 22* 27*  CREATININE 0.99 1.04  CALCIUM 9.1 9.4    PT/INR No results for input(s): "LABPROT", "INR" in the last 72 hours. ABG No results for input(s): "PHART", "HCO3" in the last 72 hours.  Invalid input(s): "PCO2", "PO2"  Studies/Results: No results found.  Anti-infectives: Anti-infectives (From admission, onward)    Start     Dose/Rate Route Frequency Ordered Stop   12/27/21 2130  ceFAZolin (ANCEF) IVPB 2g/100 mL premix        2 g 200 mL/hr over 30 Minutes Intravenous  Once 12/27/21 2039 12/27/21 2138   12/23/21 1400  cefoTEtan (CEFOTAN) 2 g in sodium chloride 0.9 % 100 mL IVPB        2 g 200 mL/hr over 30 Minutes Intravenous 60 min pre-op 12/23/21 1040 12/23/21 1444       Assessment/Plan: Sigmoid volvulus POD 14  s/p sigmoid colectomy, mobilization of splenic flexure Dr. Dossie Der 12/7 POD 10 s/p exploratory laparotomy placement of retention sutures for fascial dehiscence Dr. 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 Xray this am shows gas in colon. Continue LIWS for now. Suppository this am and AROBF - started scheduled reglan 12/19 - afebrile, WBC 13.6 - overall stable - CT 12/18 with fluid collection in pelvis not definitively abscess. Pericolonic stranding of the splenic flexure and SB mesentery. Continue to monitor abdominal exam and CBC - PICC/TPN - daily wet to dry dressing changes to midline - tolerating well, will recheck tomorrow am - abdominal binder - encouraged mobilization and IS use - given OR findings and long post up ileus question possible underlying motility or other GI issue. Recommend evaluation by GI outpatient once recovered from this admission   FEN: NGT LIWS - once up of ice/one cup juice per day, TPN, suppository 12/21 ID: cefotetan periop, ancef periop VTE: lovenox Foley: place periop 12/11, dc 12/12, voiding   LOS: 15 days    Eric Form, [facility] 01/06/2022, 9:45 AM Please see Amion for pager number during day hours 7:00am-4:30pm

## 2022-01-07 LAB — COMPREHENSIVE METABOLIC PANEL
ALT: 164 U/L — ABNORMAL HIGH (ref 0–44)
AST: 60 U/L — ABNORMAL HIGH (ref 15–41)
Albumin: 3.2 g/dL — ABNORMAL LOW (ref 3.5–5.0)
Alkaline Phosphatase: 134 U/L — ABNORMAL HIGH (ref 38–126)
Anion gap: 6 (ref 5–15)
BUN: 25 mg/dL — ABNORMAL HIGH (ref 6–20)
CO2: 24 mmol/L (ref 22–32)
Calcium: 8.8 mg/dL — ABNORMAL LOW (ref 8.9–10.3)
Chloride: 110 mmol/L (ref 98–111)
Creatinine, Ser: 0.78 mg/dL (ref 0.61–1.24)
GFR, Estimated: >60 mL/min (ref >60)
Glucose, Bld: 135 mg/dL — ABNORMAL HIGH (ref 70–99)
Potassium: 4.1 mmol/L (ref 3.5–5.1)
Sodium: 140 mmol/L (ref 135–145)
Total Bilirubin: 1.2 mg/dL (ref 0.3–1.2)
Total Protein: 7.5 g/dL (ref 6.5–8.1)

## 2022-01-07 LAB — GLUCOSE, CAPILLARY
Glucose-Capillary: 114 mg/dL — ABNORMAL HIGH (ref 70–99)
Glucose-Capillary: 137 mg/dL — ABNORMAL HIGH (ref 70–99)
Glucose-Capillary: 138 mg/dL — ABNORMAL HIGH (ref 70–99)
Glucose-Capillary: 146 mg/dL — ABNORMAL HIGH (ref 70–99)
Glucose-Capillary: 156 mg/dL — ABNORMAL HIGH (ref 70–99)
Glucose-Capillary: 160 mg/dL — ABNORMAL HIGH (ref 70–99)

## 2022-01-07 LAB — PHOSPHORUS: Phosphorus: 3.8 mg/dL (ref 2.5–4.6)

## 2022-01-07 LAB — MAGNESIUM: Magnesium: 2.1 mg/dL (ref 1.7–2.4)

## 2022-01-07 MED ORDER — ENSURE ENLIVE PO LIQD
237.0000 mL | Freq: Two times a day (BID) | ORAL | Status: DC
  Administered 2022-01-07 – 2022-01-09 (×3): 237 mL via ORAL

## 2022-01-07 MED ORDER — SENNOSIDES-DOCUSATE SODIUM 8.6-50 MG PO TABS
1.0000 | ORAL_TABLET | Freq: Every day | ORAL | Status: DC
  Administered 2022-01-07 – 2022-01-18 (×7): 1 via ORAL
  Filled 2022-01-07 (×9): qty 1

## 2022-01-07 MED ORDER — OXYCODONE HCL 5 MG PO TABS: 5.0000 mg | ORAL_TABLET | ORAL | Status: DC | PRN

## 2022-01-07 MED ORDER — TRAVASOL 10 % IV SOLN
INTRAVENOUS | Status: AC
  Filled 2022-01-07: qty 1440

## 2022-01-07 NOTE — TOC Progression Note (Signed)
Transition of Care [facility]) - Progression Note    Patient Details  Name: Nathaniel Hanson MRN: [id number] Date of Birth: 10-15-1980  [facility]) CM/SW Contact  Otelia Santee, LCSW Phone Number: 01/07/2022, 12:35 PM  Clinical Narrative:    Pt has concerns w/ wound care at discharge due to brother having to work and not always available to assist. [facility]. [facility] unable to [facility] for services. Pt could benefit from having additional education for woc to increase confidence/comfortability with wound care.        Expected Discharge Plan and Services                                               Social Determinants of Health (SDOH) Interventions SDOH Screenings   Food Insecurity: No Food Insecurity (12/22/2021)  Housing: Low Risk  (12/22/2021)  Transportation Needs: No Transportation Needs (12/22/2021)  Utilities: Not At Risk (12/22/2021)  Tobacco Use: Low Risk  (01/03/2022)    Readmission Risk Interventions    01/07/2022   12:34 PM 12/23/2021    8:52 AM  Readmission Risk Prevention Plan  Post Dischage Appt Complete Complete  Medication Screening Complete Complete  Transportation Screening Complete Complete

## 2022-01-07 NOTE — Progress Notes (Signed)
[facility] Progress Note  Patient: Nathaniel Hanson     IOX:[id number]  DOA: 12/21/2021   PCP: Creola Corn, MD       Brief hospital course: This is a 41 year old male who presented to the hospital after sigmoid volvulus which caused syncope. He underwent a flex sigmoidoscopy on 12/6 without success. On 12/7, the patient underwent a sigmoid colectomy and mobilization of the splenic flexure.  His postop course has been complicated by a persistent ileus.  He was noted to have fascial dehiscence and return to the OR on 12/11 for placement of retention sutures.  Currently he continues to have an ileus.  Subjective:  His NG tube was clamped yesterday and he is not having any GI complaints today.   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    Code Status: Full Code Consultants: General surgery Level of Care: Level of care: Med-Surg Total time on patient care: 35 minutes DVT prophylaxis:  enoxaparin (LOVENOX) injection 40 mg Start: 12/24/21 1000 SCDs Start: 12/22/21 0028     Objective:   Vitals:   01/06/22 0502 01/06/22 1155 01/06/22 1953 01/07/22 0430  BP: (!) 147/92 (!) 126/90 (!) 131/95 131/75  Pulse: (!) 109 (!) 110 (!) 107 100  Resp: 20 18 18 18   Temp: 98.4 F (36.9 C) 98 F (36.7 C) 98.6 F (37 C) 98.9 F (37.2 C)  TempSrc: Oral Oral Oral Oral  SpO2: 97% 98% 97% 96%  Weight:      Height:       Filed Weights   12/27/21 0500 12/28/21  0533 12/28/21 0915  Weight: 117.8 kg 119.4 kg 113.6 kg   Exam: General exam: Appears comfortable  HEENT: oral mucosa moist Respiratory system: Clear to auscultation.  Cardiovascular system: S1 & S2 heard  Gastrointestinal system: Abdomen soft, nondistended, nontender NG tube is present.  Bowel sounds present. Extremities: No cyanosis, clubbing or edema Psychiatry: Very flat affect     CBC: Recent Labs  Lab 01/03/22 0215 01/05/22 0503 01/06/22 0408  WBC 13.4* 14.6* 13.6*  HGB 13.3 13.2 13.2  HCT 40.5 41.3 42.3  MCV 87.1 87.9 88.5  PLT 395 391 392    Basic Metabolic Panel: Recent Labs  Lab 01/03/22 0215 01/04/22 0505 01/05/22 0503 01/06/22 0408 01/07/22 0439  NA 137 141 140 143 140  K 4.4 4.6 4.1 4.2 4.1  CL 104 109 106 110 110  CO2 25 25 25 25 24   GLUCOSE 155* 155* 156* 131* 135*  BUN 19 22* 22* 27* 25*  CREATININE 0.82 0.85 0.99 1.04 0.78  CALCIUM 9.3 9.6 9.1 9.4 8.8*  MG 2.5* 2.3 2.3 2.4 2.1  PHOS 4.6 5.0* 4.2 4.0 3.8    GFR: Estimated Creatinine Clearance: 162.2 mL/min (by C-G formula based on SCr of 0.78 mg/dL).  Scheduled Meds:  bisacodyl  10 mg Rectal Once   Chlorhexidine Gluconate Cloth  6 each Topical Daily   enoxaparin (LOVENOX) injection  40 mg Subcutaneous Q24H   insulin  aspart  0-15 Units Subcutaneous Q4H   metoCLOPramide (REGLAN) injection  10 mg Intravenous Q6H   pantoprazole (PROTONIX) IV  40 mg Intravenous Q24H   sodium chloride flush  10-40 mL Intracatheter Q12H   sodium chloride flush  3 mL Intravenous Q12H   Continuous Infusions:  sodium chloride 20 mL/hr at 01/07/22 0093   methocarbamol (ROBAXIN) IV     promethazine (PHENERGAN) injection (IM or IVPB) 6.25 mg (12/27/21 2021)   TPN ADULT (ION) 100 mL/hr at 01/06/22 1806   TPN ADULT (ION)     Imaging and lab data was personally reviewed DG Abd Portable 2V  Result Date: 01/06/2022 CLINICAL DATA:  Status post abdominal surgery, follow-up exam EXAM: PORTABLE ABDOMEN - 2 VIEW  COMPARISON:  CT 01/03/2022 FINDINGS: Nasogastric tube side port overlies the distal stomach, distal tip overlying the region of the proximal duodenum. There is mild gaseous distension of the colon. No evidence of bowel obstruction. Curvilinear bandlike opacity in the left lung base with underlying lucency. IMPRESSION: Curvilinear bandlike opacity and lucency in the region of the left hemidiaphragm/left lung base, favored to represent left basilar atelectasis with superimposed aerated basilar lung, as seen on recent CT. If there are peritoneal signs or if there is clinical concern for perforation, would recommend a CT. Nasogastric tube side port overlies the distal stomach, tip overlying the proximal duodenum. No evidence of bowel obstruction.  Gaseous distention of the colon. Electronically Signed   By: Caprice Renshaw M.D.   On: 01/06/2022 10:31    LOS: 16 days   Author: Calvert Cantor  01/07/2022 11:08 AM  To contact [facility]>   Check the care team in [facility] and look for the attending/consulting [facility] provider listed  Log into [email or web address] and use Felicity's universal password   Go to> "[facility]"  and find provider  If you still have difficulty reaching the provider, please page the [facility] (Director on Call) for the Hospitalists listed on amion

## 2022-01-07 NOTE — Progress Notes (Signed)
VAST consult received at 1700 to draw CBC which was ordered as an add-on this am but was unable to be added to specimens in lab. SecureChat sent to MD and RN regarding need for lab as a CBC is ordered for am. Unit Nurse to page provider.

## 2022-01-07 NOTE — Progress Notes (Addendum)
11 Days Post-Op   Subjective/Chief Complaint: NGT remained clamped yesterday afternoon/overnight. He is now passing flatus. He does appear slightly more distended and is having some gas pain but this improves when he passes flatus. He denies nausea/emesis or worsening abdominal pain otherwise  Objective: Vital signs in last 24 hours: Temp:  [98 F (36.7 C)-98.9 F (37.2 C)] 98.9 F (37.2 C) (12/22 0430) Pulse Rate:  [100-110] 100 (12/22 0430) Resp:  [18] 18 (12/22 0430) BP: (126-131)/(75-95) 131/75 (12/22 0430) SpO2:  [96 %-98 %] 96 % (12/22 0430) Last BM Date : 01/06/22  Intake/Output from previous day: 12/21 0701 - 12/22 0700 In: 120 [P.O.:120] Out: 2450 [Urine:1800; Emesis/NG output:650] Intake/Output this shift: No intake/output data recorded.  Gen - sitting up in chair. NAD Resp - effort nonlabored on room air Abd - mild distension - slightly increased from yesterday. Abdominal binder in place.  Approp tender.  Dressing changed by me this am - see below  NGT clamped  Skin - warm and dry    Lab Results:  Recent Labs    01/05/22 0503 01/06/22 0408  WBC 14.6* 13.6*  HGB 13.2 13.2  HCT 41.3 42.3  PLT 391 392    BMET Recent Labs    01/06/22 0408 01/07/22 0439  NA 143 140  K 4.2 4.1  CL 110 110  CO2 25 24  GLUCOSE 131* 135*  BUN 27* 25*  CREATININE 1.04 0.78  CALCIUM 9.4 8.8*    PT/INR No results for input(s): "LABPROT", "INR" in the last 72 hours. ABG No results for input(s): "PHART", "HCO3" in the last 72 hours.  Invalid input(s): "PCO2", "PO2"  Studies/Results: DG Abd Portable 2V  Result Date: 01/06/2022 CLINICAL DATA:  Status post abdominal surgery, follow-up exam EXAM: PORTABLE ABDOMEN - 2 VIEW COMPARISON:  CT 01/03/2022 FINDINGS: Nasogastric tube side port overlies the distal stomach, distal tip overlying the region of the proximal duodenum. There is mild gaseous distension of the colon. No evidence of bowel obstruction. Curvilinear bandlike  opacity in the left lung base with underlying lucency. IMPRESSION: Curvilinear bandlike opacity and lucency in the region of the left hemidiaphragm/left lung base, favored to represent left basilar atelectasis with superimposed aerated basilar lung, as seen on recent CT. If there are peritoneal signs or if there is clinical concern for perforation, would recommend a CT. Nasogastric tube side port overlies the distal stomach, tip overlying the proximal duodenum. No evidence of bowel obstruction.  Gaseous distention of the colon. Electronically Signed   By: Caprice Renshaw M.D.   On: 01/06/2022 10:31    Anti-infectives: Anti-infectives (From admission, onward)    Start     Dose/Rate Route Frequency Ordered Stop   12/27/21 2130  ceFAZolin (ANCEF) IVPB 2g/100 mL premix        2 g 200 mL/hr over 30 Minutes Intravenous  Once 12/27/21 2039 12/27/21 2138   12/23/21 1400  cefoTEtan (CEFOTAN) 2 g in sodium chloride 0.9 % 100 mL IVPB        2 g 200 mL/hr over 30 Minutes Intravenous 60 min pre-op 12/23/21 1040 12/23/21 1444       Assessment/Plan: Sigmoid volvulus POD 15 s/p sigmoid colectomy, mobilization of splenic flexure Dr. Dossie Der 12/7 POD 11 s/p exploratory laparotomy placement of retention sutures for fascial dehiscence Dr. 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 Does have more crampy abdominal pain and slight distension. CLD but encouraged slow intake - started scheduled reglan 12/19 - afebrile, WBC 13.6 yesterday, pending today - CT 12/18 with fluid collection in pelvis not definitively abscess. Pericolonic stranding of the splenic flexure and SB mesentery. Continue to monitor abdominal exam and CBC - daily wet to dry dressing changes to midline - tolerating well. abdominal binder - encouraged mobilization  and IS use - given OR findings and long post up ileus question possible underlying motility or other GI issue. Recommend evaluation by GI outpatient once recovered from this admission  Add PO pain meds and stool softener   FEN: NGT out. CLD, TPN, ensure ID: cefotetan periop, ancef periop VTE: lovenox Foley: place periop 12/11, dc 12/12, voiding   LOS: 16 days    Eric Form, [facility] 01/07/2022, 10:44 AM Please see Amion for pager number during day hours 7:00am-4:30pm

## 2022-01-07 NOTE — Progress Notes (Signed)
PHARMACY - TOTAL PARENTERAL NUTRITION CONSULT NOTE   Indication: Prolonged ileus  Patient Measurements: Height: _0  (185.4 cm) Weight: 113.6 kg (250 lb 8 oz) IBW/kg (Calculated) : 79.9 TPN AdjBW (KG): 88.9 Body mass index is 33.05 kg/m. Usual Weight:   Assessment: Patient is a 41 y.o M who presented to the ED on 12/21/21 for evaluation after he experienced syncope and blacked out at home.  Abdominal CT on 12/21/21 showed findings consistent with sigmoid volvulus.He underwent sigmoidoscopy on 12/22/21 with decompression of the volvulus. However, he still had significant sigmoid colon dilation s/p procedure and subsequently underwent sigmoid colectomy on 12/23/21. On 12/11, he had bleeding from surgical incision site as well as n/v and was found to have "fascial dehiscence and near evisceration of his bowel."  He was taken to the OR emergently on 12/27/21 for  exp lap with irrigation of the midline incision.  Abd x-ray on 12/27/21 showed findings consistent with post-op ileus. Fascial dehiscence. Pharmacy has been consulted to start TPN.  Glucose / Insulin: no hx DM - on mSSI q4h (11 units insulin given in the past 24 hrs) - goal cbgs <150: 135-152 Electrolytes: WNL including CorrCa - For ileus, goal for Mag > 2, K > 4, phos ~3 Renal: Scr wnl, BUN sl elevated, UOP good Hepatic:  - AST/ALT up 60/164; Tbili down 1.2; Alk phos elevated but somewhat stable 134 - albumin 3.2 - TG 134 (12/21) Intake / Output; MIVF: NS at 20 ml/hr  - I/O: - 1930 mL - NGT: 650 mL - UOP: 1400 ml - last BM on 12/21 GI Imaging: - 12/5 abd CT: Findings compatible with sigmoid volvulus. - 12/11 abd xray: Diffuse gaseous distention of bowel, compatible with postoperative ileus. - 12/14 abd xray: Persistent diffuse gaseous distension of bowel, compatible with ileus, with increased small bowel distension in comparison to prior. - 12/18 CT: fluid collection in pelvis not definitively abscess. Pericolonic stranding of  the splenic flexure and SB mesentery.  - 12/21 abd XRAY:  left basilar atelectasis with superimposed aerated basilar lung. No evidence of bowel obstruction. Gaseous distention of the colon  GI Surgeries / Procedures:  - 12/6 Flexible Sigmoidoscopy: Volvulus. Successful complete decompression achieved - 12/7: sigmoid colectomy  - 12/11: exp lap with irrigation of midline incision - 12/11: NGT placed >> came out on 12/13, replaced 12/14  Central access:  PICC placement  TPN start date: 12/28/21 PICC placement  Nutritional Goals: Goal TPN rate is 100 mL/hr (provides 144 g of protein and 2400 kcals per day)  RD Assessment: - Nathaniel Hanson, RD indicated that patient is at risk for refeeding syndrome and recom thiamine 100 mg for 5 days.  Estimated Needs Total Energy Estimated Needs: 0623-7628 kcals Total Protein Estimated Needs: 140-170 grams Total Fluid Estimated Needs: >/= 2.1L  Current Nutrition:  - 12/6: started on CLD - 12/12: NPO, started on TPN >> ice chips/popsicle   Plan:  - Continue TPN at goal rate of 100 mL/hr  - Electrolytes in TPN:  Na 95mq/L K 455m/L  Ca 13m61mL  Mg 13mE21m  Add Phos 5 mmol/L  Cl:Ac 1:1 - Add standard MVI and trace elements to TPN - Completed 5 days thiamine 100 mg in TPN (12/12 - 12/17) - Continue moderate SSI q4h - MIVF at 20 mL/hr - Monitor TPN labs on Mon/Thurs and PRN - CMET, phos and mag on 12/23  Nathaniel Hanson, BCPS 01/07/2022 7:17 AM

## 2022-01-08 LAB — GLUCOSE, CAPILLARY
Glucose-Capillary: 127 mg/dL — ABNORMAL HIGH (ref 70–99)
Glucose-Capillary: 132 mg/dL — ABNORMAL HIGH (ref 70–99)
Glucose-Capillary: 132 mg/dL — ABNORMAL HIGH (ref 70–99)
Glucose-Capillary: 133 mg/dL — ABNORMAL HIGH (ref 70–99)
Glucose-Capillary: 133 mg/dL — ABNORMAL HIGH (ref 70–99)
Glucose-Capillary: 176 mg/dL — ABNORMAL HIGH (ref 70–99)

## 2022-01-08 LAB — CBC
HCT: 37.8 % — ABNORMAL LOW (ref 39.0–52.0)
Hemoglobin: 11.8 g/dL — ABNORMAL LOW (ref 13.0–17.0)
MCH: 27.8 pg (ref 26.0–34.0)
MCHC: 31.2 g/dL (ref 30.0–36.0)
MCV: 89.2 fL (ref 80.0–100.0)
Platelets: 321 10*3/uL (ref 150–400)
RBC: 4.24 MIL/uL (ref 4.22–5.81)
RDW: 13.7 % (ref 11.5–15.5)
WBC: 9.4 10*3/uL (ref 4.0–10.5)
nRBC: 0 % (ref 0.0–0.2)

## 2022-01-08 LAB — COMPREHENSIVE METABOLIC PANEL
ALT: 149 U/L — ABNORMAL HIGH (ref 0–44)
AST: 58 U/L — ABNORMAL HIGH (ref 15–41)
Albumin: 2.9 g/dL — ABNORMAL LOW (ref 3.5–5.0)
Alkaline Phosphatase: 125 U/L (ref 38–126)
Anion gap: 5 (ref 5–15)
BUN: 20 mg/dL (ref 6–20)
CO2: 24 mmol/L (ref 22–32)
Calcium: 8.5 mg/dL — ABNORMAL LOW (ref 8.9–10.3)
Chloride: 107 mmol/L (ref 98–111)
Creatinine, Ser: 0.88 mg/dL (ref 0.61–1.24)
GFR, Estimated: >60 mL/min (ref >60)
Glucose, Bld: 130 mg/dL — ABNORMAL HIGH (ref 70–99)
Potassium: 3.8 mmol/L (ref 3.5–5.1)
Sodium: 136 mmol/L (ref 135–145)
Total Bilirubin: 0.9 mg/dL (ref 0.3–1.2)
Total Protein: 6.6 g/dL (ref 6.5–8.1)

## 2022-01-08 LAB — CULTURE, BLOOD (ROUTINE X 2)
Culture: NO GROWTH
Culture: NO GROWTH
Special Requests: ADEQUATE
Special Requests: ADEQUATE

## 2022-01-08 LAB — PHOSPHORUS: Phosphorus: 3.4 mg/dL (ref 2.5–4.6)

## 2022-01-08 LAB — MAGNESIUM: Magnesium: 1.9 mg/dL (ref 1.7–2.4)

## 2022-01-08 MED ORDER — POTASSIUM CHLORIDE 10 MEQ/50ML IV SOLN
10.0000 meq | INTRAVENOUS | Status: AC
  Administered 2022-01-08 (×2): 10 meq via INTRAVENOUS
  Filled 2022-01-08 (×2): qty 50

## 2022-01-08 MED ORDER — TRAVASOL 10 % IV SOLN
INTRAVENOUS | Status: AC
  Filled 2022-01-08: qty 1440

## 2022-01-08 MED ORDER — MAGNESIUM SULFATE IN D5W 1-5 GM/100ML-% IV SOLN
1.0000 g | Freq: Once | INTRAVENOUS | Status: AC
  Administered 2022-01-08: 1 g via INTRAVENOUS
  Filled 2022-01-08: qty 100

## 2022-01-08 MED ORDER — ACETAMINOPHEN 325 MG PO TABS: 650.0000 mg | ORAL_TABLET | Freq: Four times a day (QID) | ORAL | Status: DC | PRN

## 2022-01-08 NOTE — Plan of Care (Signed)
  Problem: Education: Goal: Knowledge of condition and prescribed therapy will improve Outcome: Progressing   Problem: Cardiac: Goal: Will achieve and/or maintain adequate cardiac output Outcome: Progressing   Problem: Physical Regulation: Goal: Complications related to the disease process, condition or treatment will be avoided or minimized Outcome: Progressing   Problem: Education: Goal: Knowledge of General Education information will improve Description: Including pain rating scale, medication(s)/side effects and non-pharmacologic comfort measures Outcome: Progressing   Problem: Health Behavior/Discharge Planning: Goal: Ability to manage health-related needs will improve Outcome: Progressing   Problem: Clinical Measurements: Goal: Ability to maintain clinical measurements within normal limits will improve Outcome: Progressing Goal: Will remain free from infection Outcome: Progressing Goal: Diagnostic test results will improve Outcome: Progressing Goal: Respiratory complications will improve Outcome: Progressing Goal: Cardiovascular complication will be avoided Outcome: Progressing   Problem: Activity: Goal: Risk for activity intolerance will decrease Outcome: Progressing   Problem: Nutrition: Goal: Adequate nutrition will be maintained Outcome: Progressing   Problem: Coping: Goal: Level of anxiety will decrease Outcome: Progressing   Problem: Elimination: Goal: Will not experience complications related to bowel motility Outcome: Progressing Goal: Will not experience complications related to urinary retention Outcome: Progressing   Problem: Pain Managment: Goal: General experience of comfort will improve Outcome: Progressing   Problem: Safety: Goal: Ability to remain free from injury will improve Outcome: Progressing   Problem: Skin Integrity: Goal: Risk for impaired skin integrity will decrease Outcome: Progressing   Problem: Education: Goal:  Understanding of discharge needs will improve Outcome: Progressing Goal: Verbalization of understanding of the causes of altered bowel function will improve Outcome: Progressing   Problem: Activity: Goal: Ability to tolerate increased activity will improve Outcome: Progressing   Problem: Bowel/Gastric: Goal: Gastrointestinal status for postoperative course will improve Outcome: Progressing   Problem: Health Behavior/Discharge Planning: Goal: Identification of community resources to assist with postoperative recovery needs will improve Outcome: Progressing   Problem: Nutritional: Goal: Will attain and maintain optimal nutritional status will improve Outcome: Progressing   Problem: Clinical Measurements: Goal: Postoperative complications will be avoided or minimized Outcome: Progressing   Problem: Respiratory: Goal: Respiratory status will improve Outcome: Progressing   Problem: Skin Integrity: Goal: Will show signs of wound healing Outcome: Progressing   

## 2022-01-08 NOTE — Progress Notes (Signed)
PHARMACY - TOTAL PARENTERAL NUTRITION CONSULT NOTE   Indication: Prolonged ileus  Patient Measurements: Height: _0  (185.4 cm) Weight: 113.6 kg (250 lb 8 oz) IBW/kg (Calculated) : 79.9 TPN AdjBW (KG): 88.9 Body mass index is 33.05 kg/m. Usual Weight:   Assessment: Patient is a 41 y.o M who presented to the ED on 12/21/21 for evaluation after he experienced syncope and blacked out at home.  Abdominal CT on 12/21/21 showed findings consistent with sigmoid volvulus.He underwent sigmoidoscopy on 12/22/21 with decompression of the volvulus. However, he still had significant sigmoid colon dilation s/p procedure and subsequently underwent sigmoid colectomy on 12/23/21. On 12/11, he had bleeding from surgical incision site as well as n/v and was found to have "fascial dehiscence and near evisceration of his bowel."  He was taken to the OR emergently on 12/27/21 for  exp lap with irrigation of the midline incision.  Abd x-ray on 12/27/21 showed findings consistent with post-op ileus. Fascial dehiscence. Pharmacy has been consulted to start TPN.  Glucose / Insulin: no hx DM - on mSSI q4h (12 units insulin given in the past 24 hrs) - goal cbgs <150: 114-160 over previous 24hrs Electrolytes: WNL including CorrCa; sodium, potassium, and magnesium trending down to lower end of normal limits - For ileus, goal for Mag > 2, K > 4, phos ~3 Renal: Scr wnl, BUN WNL, UOP good Hepatic:  - AST/ALT down 58/149; Tbili down 0.9; Alk phos no longer elevated at 125 - albumin down 2.9 - TG 134 (12/21) Intake / Output; MIVF: NS at 20 ml/hr  - I/O: - 437.4 mL - NGT: 0 mL - UOP: 900 ml - last BM on 12/21 GI Imaging: - 12/5 abd CT: Findings compatible with sigmoid volvulus. - 12/11 abd xray: Diffuse gaseous distention of bowel, compatible with postoperative ileus. - 12/14 abd xray: Persistent diffuse gaseous distension of bowel, compatible with ileus, with increased small bowel distension in comparison to prior. -  12/18 CT: fluid collection in pelvis not definitively abscess. Pericolonic stranding of the splenic flexure and SB mesentery.  - 12/21 abd XRAY:  left basilar atelectasis with superimposed aerated basilar lung. No evidence of bowel obstruction. Gaseous distention of the colon  GI Surgeries / Procedures:  - 12/6 Flexible Sigmoidoscopy: Volvulus. Successful complete decompression achieved - 12/7: sigmoid colectomy  - 12/11: exp lap with irrigation of midline incision - 12/11: NGT placed >> came out on 12/13, replaced 12/14  Central access:  PICC placement  TPN start date: 12/28/21 PICC placement  Nutritional Goals: Goal TPN rate is 100 mL/hr (provides 144 g of protein and 2400 kcals per day)  RD Assessment: - Samson Frederic, RD indicated that patient is at risk for refeeding syndrome and recom thiamine 100 mg for 5 days; completed 12/12-12/17.  Estimated Needs Total Energy Estimated Needs: 4270-6237 kcals Total Protein Estimated Needs: 140-170 grams Total Fluid Estimated Needs: >/= 2.1L  Current Nutrition:  - 12/6: started on CLD - 12/12: NPO, started on TPN >> ice chips/popsicle   Plan:  -Magnesium 1 g IV x 1 -KCl 10 mEq q1h x 2  - Continue TPN at goal rate of 100 mL/hr  - Electrolytes in TPN:  Na 70 mEq/L, increased K 50 mEq/L, increased  Ca 26mq/L  Mg 3 mEq/L, increased  Add Phos 5 mmol/L  Cl:Ac 1:1 - Add standard MVI and trace elements to TPN - Continue moderate SSI q4h - MIVF at 20 mL/hr - Monitor TPN labs on Mon/Thurs and PRN - BMET,  phos and mag in am  Thank you for allowing pharmacy to be a part of this patient's care.  Royetta Asal, PharmD, BCPS Clinical Pharmacist [place] Please utilize Amion for appropriate phone number to reach the unit pharmacist ([place]) 01/08/2022 7:29 AM

## 2022-01-08 NOTE — Progress Notes (Signed)
12 Days Post-Op   Subjective/Chief Complaint: Tolerated liquids well yesterday, no nausea but he does note a little bit of bloating.  No significant pain.  Objective: Vital signs in last 24 hours: Temp:  [98.3 F (36.8 C)-98.8 F (37.1 C)] 98.8 F (37.1 C) (12/23 0534) Pulse Rate:  [98-103] 103 (12/23 0534) Resp:  [16-22] 16 (12/23 0534) BP: (131-133)/(87-94) 131/87 (12/23 0534) SpO2:  [97 %-99 %] 97 % (12/23 0534) Last BM Date : 01/07/22  Intake/Output from previous day: 12/22 0701 - 12/23 0700 In: 462.7 [I.V.:462.7] Out: 350 [Urine:350] Intake/Output this shift: No intake/output data recorded.  Gen - sitting up in chair. NAD Resp - effort nonlabored on room air Abd -minimal distention.. Abdominal binder in place.  Approp tender.  Wound is stable, shallow and granulating without evidence of recurrent dehiscence or complication. Skin - warm and dry    Lab Results:  Recent Labs    01/06/22 0408 01/08/22 0408  WBC 13.6* 9.4  HGB 13.2 11.8*  HCT 42.3 37.8*  PLT 392 321    BMET Recent Labs    01/07/22 0439 01/08/22 0408  NA 140 136  K 4.1 3.8  CL 110 107  CO2 24 24  GLUCOSE 135* 130*  BUN 25* 20  CREATININE 0.78 0.88  CALCIUM 8.8* 8.5*    PT/INR No results for input(s): "LABPROT", "INR" in the last 72 hours. ABG No results for input(s): "PHART", "HCO3" in the last 72 hours.  Invalid input(s): "PCO2", "PO2"  Studies/Results: No results found.  Anti-infectives: Anti-infectives (From admission, onward)    Start     Dose/Rate Route Frequency Ordered Stop   12/27/21 2130  ceFAZolin (ANCEF) IVPB 2g/100 mL premix        2 g 200 mL/hr over 30 Minutes Intravenous  Once 12/27/21 2039 12/27/21 2138   12/23/21 1400  cefoTEtan (CEFOTAN) 2 g in sodium chloride 0.9 % 100 mL IVPB        2 g 200 mL/hr over 30 Minutes Intravenous 60 min pre-op 12/23/21 1040 12/23/21 1444       Assessment/Plan: Sigmoid volvulus POD 16 s/p sigmoid colectomy, mobilization of  splenic flexure Dr. Dossie Der 12/7 POD 12 s/p exploratory laparotomy placement of retention sutures for fascial dehiscence Dr. 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 Pericolonic stranding of the splenic flexure and SB mesentery. Continue to monitor abdominal exam and CBC - daily wet to dry dressing changes to midline - tolerating well. abdominal binder - encouraged mobilization and IS use - given OR findings and long post up ileus question possible underlying motility or other GI issue. Recommend evaluation by GI outpatient once recovered from this admission  Add PO pain meds and stool softener   FEN: NGT out.  Try full liquids today, TPN, ensure ID: cefotetan periop, ancef periop VTE: lovenox Foley: place periop 12/11, dc 12/12, voiding   LOS: 17 days    Berna Bue, MD [facility] Surgery 01/08/2022, 9:38 AM Please see Amion for pager number during day hours 7:00am-4:30pm

## 2022-01-08 NOTE — Progress Notes (Signed)
[facility] Progress Note  Patient: Nathaniel Hanson     NIO:[id number]  DOA: 12/21/2021   PCP: Creola Corn, MD       Brief hospital course: This is a 41 year old male who presented to the hospital after sigmoid volvulus which caused syncope. He underwent a flex sigmoidoscopy on 12/6 without success. On 12/7, the patient underwent a sigmoid colectomy and mobilization of the splenic flexure.  His postop course has been complicated by a persistent ileus.  He was noted to have fascial dehiscence and return to the OR on 12/11 for placement of retention sutures.  Currently he continues to have an ileus.  Subjective:  He appears to be tolerating clear liquids.  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    Code Status: Full Code Consultants: General surgery Level of Care: Level of care: Med-Surg Total time on patient care: 15 minutes DVT prophylaxis:  enoxaparin (LOVENOX) injection 40 mg Start: 12/24/21 1000 SCDs Start: 12/22/21 0028     Objective:   Vitals:   01/07/22 0430 01/07/22 1242 01/07/22 2038 01/08/22 0534  BP: 131/75 (!) 131/94 (!) 133/94 131/87  Pulse: 100 98 (!) 103 (!) 103  Resp: 18 (!) 22 16 16   Temp: 98.9 F (37.2 C) 98.3 F (36.8 C) 98.4 F (36.9 C) 98.8 F (37.1 C)  TempSrc: Oral Oral Oral Oral  SpO2: 96% 99% 98% 97%  Weight:      Height:       Filed Weights    12/27/21 0500 12/28/21 0533 12/28/21 0915  Weight: 117.8 kg 119.4 kg 113.6 kg   Exam: General exam: Appears comfortable  HEENT: oral mucosa moist Respiratory system: Clear to auscultation.  Cardiovascular system: S1 & S2 heard  Gastrointestinal system: Abdomen soft, nondistended. Normal bowel sounds   Extremities: No cyanosis, clubbing or edema Psychiatry:  Mood & affect appropriate.       CBC: Recent Labs  Lab 01/03/22 0215 01/05/22 0503 01/06/22 0408 01/08/22 0408  WBC 13.4* 14.6* 13.6* 9.4  HGB 13.3 13.2 13.2 11.8*  HCT 40.5 41.3 42.3 37.8*  MCV 87.1 87.9 88.5 89.2  PLT 395 391 392 321    Basic Metabolic Panel: Recent Labs  Lab 01/04/22 0505 01/05/22 0503 01/06/22 0408 01/07/22 0439 01/08/22 0408  NA 141 140 143 140 136  K 4.6 4.1 4.2 4.1 3.8  CL 109 106 110 110 107  CO2 25 25 25 24 24   GLUCOSE 155* 156* 131* 135* 130*  BUN 22* 22* 27* 25* 20  CREATININE 0.85 0.99 1.04 0.78 0.88  CALCIUM 9.6 9.1 9.4 8.8* 8.5*  MG 2.3 2.3 2.4 2.1 1.9  PHOS 5.0* 4.2 4.0 3.8 3.4    GFR: Estimated Creatinine Clearance: 147.4 mL/min (by C-G formula based on SCr of 0.88 mg/dL).  Scheduled Meds:  Chlorhexidine Gluconate Cloth  6 each Topical Daily   enoxaparin (LOVENOX) injection  40 mg Subcutaneous Q24H  feeding supplement  237 mL Oral BID BM   insulin aspart  0-15 Units Subcutaneous Q4H   metoCLOPramide (REGLAN) injection  10 mg Intravenous Q6H   pantoprazole (PROTONIX) IV  40 mg Intravenous Q24H   senna-docusate  1 tablet Oral QHS   sodium chloride flush  10-40 mL Intracatheter Q12H   sodium chloride flush  3 mL Intravenous Q12H   Continuous Infusions:  sodium chloride 20 mL/hr at 01/07/22 0903   methocarbamol (ROBAXIN) IV     promethazine (PHENERGAN) injection (IM or IVPB) 6.25 mg (01/07/22 2029)   TPN ADULT (ION) 100 mL/hr at 01/07/22 1813   TPN ADULT (ION)     Imaging and lab data was personally reviewed No results found.  LOS: 17 days   Author: Calvert Cantor  01/08/2022 1:12 PM  To contact [facility]>   Check the care team in [facility] and look for the attending/consulting [facility] provider listed  Log into [email or web address] and use [place]'s universal password   Go to> "[facility]"  and find provider  If you still have difficulty reaching the provider, please page the [facility] (Director on Call) for the Hospitalists listed on amion

## 2022-01-09 ENCOUNTER — Inpatient Hospital Stay (HOSPITAL_COMMUNITY): Payer: Self-pay

## 2022-01-09 LAB — PHOSPHORUS: Phosphorus: 2.1 mg/dL — ABNORMAL LOW (ref 2.5–4.6)

## 2022-01-09 LAB — BASIC METABOLIC PANEL
Anion gap: 6 (ref 5–15)
BUN: 16 mg/dL (ref 6–20)
CO2: 23 mmol/L (ref 22–32)
Calcium: 8.7 mg/dL — ABNORMAL LOW (ref 8.9–10.3)
Chloride: 107 mmol/L (ref 98–111)
Creatinine, Ser: 0.72 mg/dL (ref 0.61–1.24)
GFR, Estimated: >60 mL/min (ref >60)
Glucose, Bld: 196 mg/dL — ABNORMAL HIGH (ref 70–99)
Potassium: 4.2 mmol/L (ref 3.5–5.1)
Sodium: 136 mmol/L (ref 135–145)

## 2022-01-09 LAB — CBC
HCT: 37.5 % — ABNORMAL LOW (ref 39.0–52.0)
Hemoglobin: 12.3 g/dL — ABNORMAL LOW (ref 13.0–17.0)
MCH: 28.5 pg (ref 26.0–34.0)
MCHC: 32.8 g/dL (ref 30.0–36.0)
MCV: 86.8 fL (ref 80.0–100.0)
Platelets: 332 10*3/uL (ref 150–400)
RBC: 4.32 MIL/uL (ref 4.22–5.81)
RDW: 13.2 % (ref 11.5–15.5)
WBC: 11.3 10*3/uL — ABNORMAL HIGH (ref 4.0–10.5)
nRBC: 0 % (ref 0.0–0.2)

## 2022-01-09 LAB — GLUCOSE, CAPILLARY
Glucose-Capillary: 153 mg/dL — ABNORMAL HIGH (ref 70–99)
Glucose-Capillary: 156 mg/dL — ABNORMAL HIGH (ref 70–99)
Glucose-Capillary: 163 mg/dL — ABNORMAL HIGH (ref 70–99)
Glucose-Capillary: 175 mg/dL — ABNORMAL HIGH (ref 70–99)
Glucose-Capillary: 184 mg/dL — ABNORMAL HIGH (ref 70–99)
Glucose-Capillary: 194 mg/dL — ABNORMAL HIGH (ref 70–99)
Glucose-Capillary: 200 mg/dL — ABNORMAL HIGH (ref 70–99)

## 2022-01-09 LAB — MAGNESIUM: Magnesium: 2 mg/dL (ref 1.7–2.4)

## 2022-01-09 MED ORDER — TRAVASOL 10 % IV SOLN
INTRAVENOUS | Status: AC
  Filled 2022-01-09: qty 1440

## 2022-01-09 MED ORDER — IOHEXOL 300 MG/ML  SOLN
100.0000 mL | Freq: Once | INTRAMUSCULAR | Status: AC | PRN
  Administered 2022-01-09: 100 mL via INTRAVENOUS

## 2022-01-09 NOTE — Progress Notes (Signed)
[facility] Progress Note  Patient: Nathaniel Hanson     KNL:[id number]  DOA: 12/21/2021   PCP: Creola Corn, MD       Brief hospital course: This is a 41 year old male who presented to the hospital after sigmoid volvulus which caused syncope. He underwent a flex sigmoidoscopy on 12/6 without success. On 12/7, the patient underwent a sigmoid colectomy and mobilization of the splenic flexure.  His postop course has been complicated by a persistent ileus.  He was noted to have fascial dehiscence and return to the OR on 12/11 for placement of retention sutures.  Currently he continues to have an ileus.  Subjective:  On and off nausea. No vomiting.   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    Code Status: Full Code Consultants: General surgery Level of Care: Level of care: Med-Surg Total time on patient care: 15 minutes DVT prophylaxis:  enoxaparin (LOVENOX) injection 40 mg Start: 12/24/21 1000 SCDs Start: 12/22/21 0028     Objective:   Vitals:   01/08/22 0534 01/08/22 1538 01/08/22 2056 01/09/22 0600  BP: 131/87 (!) 141/93 (!) 138/90 (!) 150/92  Pulse: (!) 103 92 (!) 104 100  Resp: 16 16 18    Temp: 98.8 F (37.1 C) 98.6 F (37 C) 98.4 F (36.9 C) 98.4 F (36.9 C)  TempSrc: Oral Oral Oral Oral  SpO2: 97% 97% 99% 98%  Weight:      Height:       Filed Weights   12/27/21 0500 12/28/21 0533 12/28/21 0915  Weight: 117.8 kg  119.4 kg 113.6 kg   Exam: General exam: Appears comfortable  HEENT: oral mucosa moist Respiratory system: Clear to auscultation.  Cardiovascular system: S1 & S2 heard  Gastrointestinal system: Abdomen soft, nondistended. Normal bowel sounds   Extremities: No cyanosis, clubbing or edema Psychiatry:  Mood & affect appropriate.       CBC: Recent Labs  Lab 01/03/22 0215 01/05/22 0503 01/06/22 0408 01/08/22 0408 01/09/22 0200  WBC 13.4* 14.6* 13.6* 9.4 11.3*  HGB 13.3 13.2 13.2 11.8* 12.3*  HCT 40.5 41.3 42.3 37.8* 37.5*  MCV 87.1 87.9 88.5 89.2 86.8  PLT 395 391 392 321 332    Basic Metabolic Panel: Recent Labs  Lab 01/05/22 0503 01/06/22 0408 01/07/22 0439 01/08/22 0408 01/09/22 0200  NA 140 143 140 136 136  K 4.1 4.2 4.1 3.8 4.2  CL 106 110 110 107 107  CO2 25 25 24 24 23   GLUCOSE 156* 131* 135* 130* 196*  BUN 22* 27* 25* 20 16  CREATININE 0.99 1.04 0.78 0.88 0.72  CALCIUM 9.1 9.4 8.8* 8.5* 8.7*  MG 2.3 2.4 2.1 1.9 2.0  PHOS 4.2 4.0 3.8 3.4 2.1*    GFR: Estimated Creatinine Clearance: 162.2 mL/min (by C-G formula based on SCr of 0.72 mg/dL).  Scheduled Meds:  Chlorhexidine Gluconate Cloth  6 each Topical Daily   enoxaparin (LOVENOX) injection  40 mg Subcutaneous Q24H   feeding supplement  237 mL Oral BID BM   insulin aspart  0-15 Units Subcutaneous Q4H   metoCLOPramide (REGLAN) injection  10 mg Intravenous Q6H   pantoprazole (PROTONIX) IV  40 mg Intravenous Q24H   senna-docusate  1 tablet Oral QHS   sodium chloride flush  10-40 mL Intracatheter Q12H   sodium chloride flush  3 mL Intravenous Q12H   Continuous Infusions:  sodium chloride 20 mL/hr at 01/07/22 [id number]   methocarbamol (ROBAXIN) IV     promethazine (PHENERGAN) injection (IM or IVPB) 6.25 mg (01/09/22 0935)   TPN ADULT (ION) 100 mL/hr at 01/08/22 1733   TPN ADULT (ION)     Imaging and lab data was personally reviewed CT ABDOMEN PELVIS W CONTRAST  Result Date: 01/09/2022 CLINICAL DATA:   Postop.  Intra-abdominal abscess. EXAM: CT ABDOMEN AND PELVIS WITH CONTRAST TECHNIQUE: Multidetector CT imaging of the abdomen and pelvis was performed using the standard protocol following bolus administration of intravenous contrast. RADIATION DOSE REDUCTION: This exam was performed according to the departmental dose-optimization program which includes automated exposure control, adjustment of the mA and/or kV according to patient size and/or use of iterative reconstruction technique. CONTRAST:  150mL OMNIPAQUE IOHEXOL 300 MG/ML  SOLN COMPARISON:  01/03/2022.  12/21/2021. FINDINGS: Lower chest: Minor subsegmental atelectasis at the dependent lung bases, similar on the right, improved on the left compared to the prior exam. No acute findings. Hepatobiliary: No focal liver abnormality is seen. No gallstones, gallbladder wall thickening, or biliary dilatation. Pancreas: Unremarkable. No pancreatic ductal dilatation or surrounding inflammatory changes. Spleen: Normal in size without focal abnormality. Adrenals/Urinary Tract: Adrenal glands are unremarkable. Kidneys are normal, without renal calculi, focal lesion, or hydronephrosis. Bladder is unremarkable. Stomach/Bowel: Colon is distended from the cecum to the rectosigmoid, maximum along the ascending colon, 7.5 cm. No colonic wall thickening. Colon and rectum show air-fluid levels. Sigmoid colon anastomosis staple line stable from the prior CT. Mild adjacent trace amount of adjacent fluid and fat stranding. Fluid noted in the pelvis on the prior CT has otherwise resolved. There is no defined collection on the current exam to suggest an abscess. No extraluminal or free air. Stomach is unremarkable. Small bowel is normal in caliber. No wall thickening or inflammation. Vascular/Lymphatic: No significant vascular findings are present. No enlarged abdominal or pelvic lymph nodes. Reproductive: Unremarkable. Other: Midline incision.  No dehiscence.  No hernia.  Musculoskeletal: No fracture or acute finding.  No bone lesion. IMPRESSION: 1. Fluid noted in the pelvis on the previous CT has mostly resolved. There is trace fluid adjacent to the sigmoid colon at and just proximal to the anastomosis. No evidence of an abscess. No colonic wall thickening. 2. Diffuse colonic distension, with colonic and rectal air-fluid levels, consistent with a colonic adynamic ileus. No evidence of obstruction. 3. No extraluminal or free air. Electronically Signed   By: Lajean Manes M.D.   On: 01/09/2022 11:04    LOS: 18 days   Author: Debbe Odea  01/09/2022 1:26 PM  To contact [facility]>   Check the care team in [facility] and look for the attending/consulting [place] provider listed  Log into [email or web address] and use [place]'s universal password   Go to> "[facility]"  and find provider  If you still have difficulty reaching the provider, please page the [facility] (Director on Call) for the Hospitalists listed on amion

## 2022-01-09 NOTE — Progress Notes (Addendum)
13 Days Post-Op   Subjective/Chief Complaint: Nausea this morning.  Did not really walk much yesterday.  Objective: Vital signs in last 24 hours: Temp:  [98.4 F (36.9 C)-98.6 F (37 C)] 98.4 F (36.9 C) (12/24 0600) Pulse Rate:  [92-104] 100 (12/24 0600) Resp:  [16-18] 18 (12/23 2056) BP: (138-150)/(90-93) 150/92 (12/24 0600) SpO2:  [97 %-99 %] 98 % (12/24 0600) Last BM Date : 01/07/22  Intake/Output from previous day: 12/23 0701 - 12/24 0700 In: 480 [P.O.:480] Out: 650 [Urine:650] Intake/Output this shift: No intake/output data recorded.  Gen -resting comfortably Resp - effort nonlabored on room air Abd -soft, distended, appropriately tender Skin - warm and dry    Lab Results:  Recent Labs    01/08/22 0408 01/09/22 0200  WBC 9.4 11.3*  HGB 11.8* 12.3*  HCT 37.8* 37.5*  PLT 321 332    BMET Recent Labs    01/08/22 0408 01/09/22 0200  NA 136 136  K 3.8 4.2  CL 107 107  CO2 24 23  GLUCOSE 130* 196*  BUN 20 16  CREATININE 0.88 0.72  CALCIUM 8.5* 8.7*    PT/INR No results for input(s): "LABPROT", "INR" in the last 72 hours. ABG No results for input(s): "PHART", "HCO3" in the last 72 hours.  Invalid input(s): "PCO2", "PO2"  Studies/Results: No results found.  Anti-infectives: Anti-infectives (From admission, onward)    Start     Dose/Rate Route Frequency Ordered Stop   12/27/21 2130  ceFAZolin (ANCEF) IVPB 2g/100 mL premix        2 g 200 mL/hr over 30 Minutes Intravenous  Once 12/27/21 2039 12/27/21 2138   12/23/21 1400  cefoTEtan (CEFOTAN) 2 g in sodium chloride 0.9 % 100 mL IVPB        2 g 200 mL/hr over 30 Minutes Intravenous 60 min pre-op 12/23/21 1040 12/23/21 1444       Assessment/Plan: Sigmoid volvulus POD 17 s/p sigmoid colectomy, mobilization of splenic flexure Dr. Dossie Der 12/7 POD 13 s/p exploratory laparotomy placement of retention sutures for fascial dehiscence Dr. Magnus Ivan 12/11 OR findings of chronic sigmoid  obstruction from adhesive bands  - surgical path with findings consistent with the above - benign - started scheduled reglan 12/19 - afebrile, WBC 11.3 this morning (9.4 yesterday) - CT 12/18 with fluid collection in pelvis not definitively abscess. Pericolonic stranding of the splenic flexure and SB mesentery.  Given persistent ileus symptoms, will repeat CT and consult IR for possible drainage - daily wet to dry dressing changes to midline - tolerating well. abdominal binder - encouraged mobilization and IS use - given OR findings and long post up ileus question possible underlying motility or other GI issue. Recommend evaluation by GI outpatient once recovered from this admission    FEN: NGT out.  Would not advance past full liquids at this point, continue TPN, ensure ID: cefotetan periop, ancef periop VTE: lovenox Foley: place periop 12/11, dc 12/12, voiding   LOS: 18 days    Berna Bue, MD [facility] Surgery 01/09/2022, 7:02 AM Please see Amion for pager number during day hours 7:00am-4:30pm

## 2022-01-09 NOTE — Progress Notes (Signed)
PHARMACY - TOTAL PARENTERAL NUTRITION CONSULT NOTE   Indication: Prolonged ileus  Patient Measurements: Height: 6' 1" (185.4 cm) Weight: 113.6 kg (250 lb 8 oz) IBW/kg (Calculated) : 79.9 TPN AdjBW (KG): 88.9 Body mass index is 33.05 kg/m. Usual Weight:   Assessment: Patient is a 41 y.o M who presented to the ED on 12/21/21 for evaluation after he experienced syncope and blacked out at home.  Abdominal CT on 12/21/21 showed findings consistent with sigmoid volvulus.He underwent sigmoidoscopy on 12/22/21 with decompression of the volvulus. However, he still had significant sigmoid colon dilation s/p procedure and subsequently underwent sigmoid colectomy on 12/23/21. On 12/11, he had bleeding from surgical incision site as well as n/v and was found to have "fascial dehiscence and near evisceration of his bowel."  He was taken to the OR emergently on 12/27/21 for  exp lap with irrigation of the midline incision.  Abd x-ray on 12/27/21 showed findings consistent with post-op ileus. Fascial dehiscence. Pharmacy has been consulted to start TPN.  Recent Labs    01/07/22 0439 01/08/22 0408 01/09/22 0200  NA 140 136 136  K 4.1 3.8 4.2  CL 110 107 107  CO2 _0 GLUCOSE 135* 130* 196*  BUN 25* 20 16  CREATININE 0.78 0.88 0.72  CALCIUM 8.8* 8.5* 8.7*  PHOS 3.8 3.4 2.1*  MG 2.1 1.9 2.0  ALBUMIN 3.2* 2.9*  --   ALKPHOS 134* 125  --   AST 60* 58*  --   ALT 164* 149*  --   BILITOT 1.2 0.9  --      Glucose / Insulin: no hx DM - on mSSI q4h (13 units insulin given in the past 24 hrs) - goal cbgs <150: 127-200 over previous 24hrs Electrolytes: Phos low.  Others WNL except Phos low - For ileus, goal for Mag > 2, K > 4, phos ~3 Renal: Scr wnl, BUN WNL, UOP good Hepatic:  - On 12/23: AST/ALT improved; Tbili and Alk Phos WNL - albumin down 2.9 - TG 134 (12/21) Intake / Output; MIVF: NS at 20 ml/hr  - I/O: - 520 mL - NGT: 0 mL - UOP: 1000 ml - last BM on 12/22 GI Imaging: - 12/5 abd CT:  Findings compatible with sigmoid volvulus. - 12/11 abd xray: Diffuse gaseous distention of bowel, compatible with postoperative ileus. - 12/14 abd xray: Persistent diffuse gaseous distension of bowel, compatible with ileus, with increased small bowel distension in comparison to prior. - 12/18 CT: fluid collection in pelvis not definitively abscess. Pericolonic stranding of the splenic flexure and SB mesentery.  - 12/21 abd XRAY:  left basilar atelectasis with superimposed aerated basilar lung. No evidence of bowel obstruction. Gaseous distention of the colon  - 12/24 for CT abd/pelvis given persistent ileus symptoms  Surgery is also consulting IR for possible drainage.   GI Surgeries / Procedures:  - 12/6 Flexible Sigmoidoscopy: Volvulus. Successful complete decompression achieved - 12/7: sigmoid colectomy  - 12/11: exp lap with irrigation of midline incision - 12/11: NGT placed >> came out on 12/13, replaced 12/14, removed 12/22  Central access:  PICC placement  TPN start date: 12/28/21 PICC placement  Nutritional Goals: Goal TPN rate is 100 mL/hr (provides 144 g of protein and 2400 kcals per day)  RD Assessment: - Samson Frederic, RD indicated that patient is at risk for refeeding syndrome and recom thiamine 100 mg for 5 days; completed 12/12-12/17.  Estimated Needs Total Energy Estimated Needs: 7741-2878 kcals Total Protein Estimated Needs:  140-170 grams Total Fluid Estimated Needs: >/= 2.1L  Current Nutrition:  - 12/6: started on CLD - 12/12: NPO, started on TPN >> ice chips/popsicle  - 12/22: started Ensure Enlive BID.  Charted as taking AM feedingon 12/23. - 12/23: started on FLD. Charted as taking 25% at PM feeding on 12/23. - 12/24 reportedly with nausea today; MD not advancing diet further at present  Plan:  - Continue TPN at 100 mL/hr.  - Electrolytes in TPN:  Na 70 mEq/L K 50 mEq/L  Ca 2mEq/L  Mg 3 mEq/L Phos 7 mmol/L, increased  Cl:Ac 1:1 - Add standard MVI and  trace elements to TPN - Continue moderate SSI q4h - MIVF at 20 mL/hr - Monitor TPN labs on Mon/Thurs and PRN  Thank you for allowing pharmacy to be a part of this patient's care.  Randy Absher, PharmD, BCPS 01/09/2022  6:32 AM Please see [facility] for contact phone numbers   

## 2022-01-09 NOTE — Progress Notes (Signed)
IR was requested for image guided drain placement.   CT from 12/18 reviewed by Dr. Fredia Sorrow, the intraabdominal fluid appears simple, not infected.  Repeat CT today showed:  1. Fluid noted in the pelvis on the previous CT has mostly resolved. There is trace fluid adjacent to the sigmoid colon at and just proximal to the anastomosis. No evidence of an abscess. No colonic wall thickening. 2. Diffuse colonic distension, with colonic and rectal air-fluid levels, consistent with a colonic adynamic ileus. No evidence of obstruction. 3. No extraluminal or free air.   Discussed with ordering provider, OK to cancel drain placement.   Please call IR for questions and concerns.   Lynann Bologna Zuzu Befort PA-C 01/09/2022 12:16 PM

## 2022-01-10 LAB — COMPREHENSIVE METABOLIC PANEL
ALT: 94 U/L — ABNORMAL HIGH (ref 0–44)
AST: 30 U/L (ref 15–41)
Albumin: 2.8 g/dL — ABNORMAL LOW (ref 3.5–5.0)
Alkaline Phosphatase: 107 U/L (ref 38–126)
Anion gap: 6 (ref 5–15)
BUN: 15 mg/dL (ref 6–20)
CO2: 24 mmol/L (ref 22–32)
Calcium: 8.2 mg/dL — ABNORMAL LOW (ref 8.9–10.3)
Chloride: 106 mmol/L (ref 98–111)
Creatinine, Ser: 0.72 mg/dL (ref 0.61–1.24)
GFR, Estimated: >60 mL/min (ref >60)
Glucose, Bld: 154 mg/dL — ABNORMAL HIGH (ref 70–99)
Potassium: 3.9 mmol/L (ref 3.5–5.1)
Sodium: 136 mmol/L (ref 135–145)
Total Bilirubin: 0.6 mg/dL (ref 0.3–1.2)
Total Protein: 6.6 g/dL (ref 6.5–8.1)

## 2022-01-10 LAB — CBC
HCT: 36.6 % — ABNORMAL LOW (ref 39.0–52.0)
Hemoglobin: 12 g/dL — ABNORMAL LOW (ref 13.0–17.0)
MCH: 28.6 pg (ref 26.0–34.0)
MCHC: 32.8 g/dL (ref 30.0–36.0)
MCV: 87.4 fL (ref 80.0–100.0)
Platelets: 310 10*3/uL (ref 150–400)
RBC: 4.19 MIL/uL — ABNORMAL LOW (ref 4.22–5.81)
RDW: 13.2 % (ref 11.5–15.5)
WBC: 9.7 10*3/uL (ref 4.0–10.5)
nRBC: 0 % (ref 0.0–0.2)

## 2022-01-10 LAB — GLUCOSE, CAPILLARY
Glucose-Capillary: 182 mg/dL — ABNORMAL HIGH (ref 70–99)
Glucose-Capillary: 160 mg/dL — ABNORMAL HIGH (ref 70–99)
Glucose-Capillary: 175 mg/dL — ABNORMAL HIGH (ref 70–99)
Glucose-Capillary: 137 mg/dL — ABNORMAL HIGH (ref 70–99)
Glucose-Capillary: 136 mg/dL — ABNORMAL HIGH (ref 70–99)
Glucose-Capillary: 130 mg/dL — ABNORMAL HIGH (ref 70–99)

## 2022-01-10 LAB — TRIGLYCERIDES: Triglycerides: 78 mg/dL (ref <150)

## 2022-01-10 LAB — PHOSPHORUS: Phosphorus: 1.7 mg/dL — ABNORMAL LOW (ref 2.5–4.6)

## 2022-01-10 LAB — MAGNESIUM: Magnesium: 2 mg/dL (ref 1.7–2.4)

## 2022-01-10 MED ORDER — OXYCODONE HCL 5 MG PO TABS: 5.0000 mg | ORAL_TABLET | Freq: Four times a day (QID) | ORAL | Status: DC | PRN

## 2022-01-10 MED ORDER — TRAVASOL 10 % IV SOLN
INTRAVENOUS | Status: AC
  Filled 2022-01-10: qty 1440

## 2022-01-10 MED ORDER — SODIUM CHLORIDE 0.9 % IV SOLN
30.0000 mmol | Freq: Once | INTRAVENOUS | Status: AC
  Administered 2022-01-10: 30 mmol via INTRAVENOUS
  Filled 2022-01-10: qty 10

## 2022-01-10 MED ORDER — SODIUM CHLORIDE 0.9 % IV BOLUS: 500.0000 mL | Freq: Once | INTRAVENOUS | Status: DC | PRN

## 2022-01-10 MED ORDER — BISACODYL 10 MG RE SUPP
10.0000 mg | Freq: Every day | RECTAL | Status: DC
  Administered 2022-01-10 – 2022-01-16 (×3): 10 mg via RECTAL
  Filled 2022-01-10 (×8): qty 1

## 2022-01-10 NOTE — Progress Notes (Signed)
[facility] Progress Note  Patient: Nathaniel Hanson     NKN:[id number]  DOA: 12/21/2021   PCP: Creola Corn, MD       Brief hospital course: This is a 41 year old male who presented to the hospital after sigmoid volvulus which caused syncope. He underwent a flex sigmoidoscopy on 12/6 without success. On 12/7, the patient underwent a sigmoid colectomy and mobilization of the splenic flexure.  His postop course has been complicated by a persistent ileus.  He was noted to have fascial dehiscence and return to the OR on 12/11 for placement of retention sutures.  Currently he continues to have an ileus.  Subjective:  On and off nausea still.  He states he had a bowel movement around midnight last night.  Called by RN later because he had a vasovagal episode in the bathroom.  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    Code Status: Full Code Consultants: General surgery Level of Care: Level of care: Med-Surg Total time on patient care: 15 minutes DVT prophylaxis:  enoxaparin (LOVENOX) injection 40 mg Start: 12/24/21 1000 SCDs Start: 12/22/21 0028     Objective:   Vitals:   01/09/22 0600 01/09/22 1516 01/09/22 2020 01/10/22 0408  BP: (!) 150/92 (!) 147/84 138/88 117/76  Pulse: 100 86 89 (!) 106  Resp:  18 20 16   Temp: 98.4 F (36.9 C) 99 F (37.2 C) 98.4 F (36.9 C) 99.1 F (37.3 C)  TempSrc: Oral Oral    SpO2: 98%  98%  97%  Weight:      Height:       Filed Weights   12/27/21 0500 12/28/21 0533 12/28/21 0915  Weight: 117.8 kg 119.4 kg 113.6 kg   Exam: General exam: Appears comfortable  HEENT: oral mucosa moist Respiratory system: Clear to auscultation.  Cardiovascular system: S1 & S2 heard  Gastrointestinal system: Abdomen soft, non-tender, nondistended. Normal bowel sounds   Extremities: No cyanosis, clubbing or edema Psychiatry:  Mood & affect appropriate.        CBC: Recent Labs  Lab 01/05/22 0503 01/06/22 0408 01/08/22 0408 01/09/22 0200 01/10/22 0258  WBC 14.6* 13.6* 9.4 11.3* 9.7  HGB 13.2 13.2 11.8* 12.3* 12.0*  HCT 41.3 42.3 37.8* 37.5* 36.6*  MCV 87.9 88.5 89.2 86.8 87.4  PLT 391 392 321 332 310    Basic Metabolic Panel: Recent Labs  Lab 01/06/22 0408 01/07/22 0439 01/08/22 0408 01/09/22 0200 01/10/22 0258  NA 143 140 136 136 136  K 4.2 4.1 3.8 4.2 3.9  CL 110 110 107 107 106  CO2 25 24 24 23 24   GLUCOSE 131* 135* 130* 196* 154*  BUN 27* 25* 20 16 15   CREATININE 1.04 0.78 0.88 0.72 0.72  CALCIUM 9.4 8.8* 8.5* 8.7* 8.2*  MG 2.4 2.1 1.9 2.0 2.0  PHOS 4.0 3.8 3.4 2.1* 1.7*    GFR: Estimated Creatinine Clearance: 162.2 mL/min (by C-G formula based on SCr of 0.72 mg/dL).  Scheduled Meds:  bisacodyl  10 mg Rectal Daily   Chlorhexidine Gluconate Cloth  6 each Topical Daily   enoxaparin (LOVENOX) injection  40 mg Subcutaneous Q24H   feeding supplement  237 mL Oral BID BM   insulin aspart  0-15 Units Subcutaneous Q4H   metoCLOPramide (REGLAN) injection  10 mg Intravenous Q6H   pantoprazole (PROTONIX) IV  40 mg Intravenous Q24H   senna-docusate  1 tablet Oral QHS   sodium chloride flush  10-40 mL Intracatheter Q12H   sodium chloride flush  3 mL Intravenous Q12H   Continuous Infusions:  sodium chloride 20 mL/hr at 01/07/22 0903   methocarbamol (ROBAXIN) IV     potassium PHOSPHATE IVPB (in mmol) 30 mmol (01/10/22 0750)   promethazine (PHENERGAN) injection  (IM or IVPB) 6.25 mg (01/09/22 0935)   sodium chloride     TPN ADULT (ION) 100 mL/hr at 01/09/22 1807   TPN ADULT (ION)     Imaging and lab data was personally reviewed CT ABDOMEN PELVIS W CONTRAST  Result Date: 01/09/2022 CLINICAL DATA:  Postop.  Intra-abdominal abscess. EXAM: CT ABDOMEN AND PELVIS WITH CONTRAST TECHNIQUE: Multidetector CT imaging of the abdomen and pelvis was performed using the standard protocol following bolus administration of intravenous contrast. RADIATION DOSE REDUCTION: This exam was performed according to the departmental dose-optimization program which includes automated exposure control, adjustment of the mA and/or kV according to patient size and/or use of iterative reconstruction technique. CONTRAST:  168mL OMNIPAQUE IOHEXOL 300 MG/ML  SOLN COMPARISON:  01/03/2022.  12/21/2021. FINDINGS: Lower chest: Minor subsegmental atelectasis at the dependent lung bases, similar on the right, improved on the left compared to the prior exam. No acute findings. Hepatobiliary: No focal liver abnormality is seen. No gallstones, gallbladder wall thickening, or biliary dilatation. Pancreas: Unremarkable. No pancreatic ductal dilatation or surrounding inflammatory changes. Spleen: Normal in size without focal abnormality. Adrenals/Urinary Tract: Adrenal glands are unremarkable. Kidneys are normal, without renal calculi, focal lesion, or hydronephrosis. Bladder is unremarkable. Stomach/Bowel: Colon is distended from the cecum to the rectosigmoid, maximum along the ascending colon, 7.5 cm. No colonic wall thickening. Colon and rectum show air-fluid levels. Sigmoid colon anastomosis staple line stable from the prior CT. Mild adjacent trace amount of adjacent fluid and fat stranding. Fluid noted in the pelvis on the prior CT has otherwise resolved. There is no defined collection on the current exam to suggest an abscess. No extraluminal or free air. Stomach is unremarkable. Small bowel is normal in  caliber. No wall thickening or inflammation. Vascular/Lymphatic: No significant vascular findings are present. No enlarged abdominal or pelvic lymph nodes. Reproductive: Unremarkable. Other: Midline incision.  No dehiscence.  No hernia. Musculoskeletal: No fracture or acute finding.  No bone lesion. IMPRESSION: 1. Fluid noted in the pelvis on the previous CT has mostly resolved. There is trace fluid adjacent to the sigmoid colon at and just proximal to the anastomosis. No evidence of an abscess. No colonic wall thickening. 2. Diffuse colonic distension, with colonic and rectal air-fluid levels, consistent with a colonic adynamic ileus. No evidence of obstruction. 3. No extraluminal or free air. Electronically Signed   By: Lajean Manes M.D.   On: 01/09/2022 11:04    LOS: 19 days   Author: Eunice Blase Unnamed Zeien  01/10/2022 11:01 AM  To contact [facility]>   Check the care team in [facility] and look for the attending/consulting [place] provider listed  Log into [email or web address] and use [place]'s universal password   Go to> "[facility]"  and find provider  If you still have difficulty reaching the provider, please page the [facility] (Director on Call) for the Hospitalists listed on amion

## 2022-01-10 NOTE — Progress Notes (Addendum)
PHARMACY - TOTAL PARENTERAL NUTRITION CONSULT NOTE   Indication: Prolonged ileus  Patient Measurements: Height: _0  (185.4 cm) Weight: 113.6 kg (250 lb 8 oz) IBW/kg (Calculated) : 79.9 TPN AdjBW (KG): 88.9 Body mass index is 33.05 kg/m. Usual Weight:   Assessment: Patient is a 41 y.o M who presented to the ED on 12/21/21 for evaluation after he experienced syncope and blacked out at home.  Abdominal CT on 12/21/21 showed findings consistent with sigmoid volvulus.He underwent sigmoidoscopy on 12/22/21 with decompression of the volvulus. However, he still had significant sigmoid colon dilation s/p procedure and subsequently underwent sigmoid colectomy on 12/23/21. On 12/11, he had bleeding from surgical incision site as well as n/v and was found to have "fascial dehiscence and near evisceration of his bowel."  He was taken to the OR emergently on 12/27/21 for  exp lap with irrigation of the midline incision.  Abd x-ray on 12/27/21 showed findings consistent with post-op ileus. Fascial dehiscence. Pharmacy has been consulted to start TPN.  Recent Labs    01/08/22 0408 01/09/22 0200 01/10/22 0258  NA 136 136 136  K 3.8 4.2 3.9  CL 107 107 106  CO2 _1 GLUCOSE 130* 196* 154*  BUN _2 CREATININE 0.88 0.72 0.72  CALCIUM 8.5* 8.7* 8.2*  PHOS 3.4 2.1* 1.7*  MG 1.9 2.0 2.0  ALBUMIN 2.9*  --  2.8*  ALKPHOS 125  --  107  AST 58*  --  30  ALT 149*  --  94*  BILITOT 0.9  --  0.6  TRIG  --   --  78      Glucose / Insulin: no hx DM - on mSSI q4h (18 units insulin given in the past 24 hrs) - goal cbgs <150: 153-184 over previous 24hrs Electrolytes: K 3.9, phos low  @ 1.7, Mg 2 - For ileus, goal for Mag > 2, K > 4, phos ~3 Renal: Scr wnl, BUN WNL, UOP good Hepatic:  AST, Tbili and Alk Phos WNL, ALT sl high - albumin 2.8 - TG 78 (12/25) Intake / Output; MIVF: NS at 20 ml/hr  - I/O: - 858 mL - UOP: 1740 ml - last BM on 12/24 GI Imaging: - 12/5 abd CT: Findings compatible  with sigmoid volvulus. - 12/11 abd xray: Diffuse gaseous distention of bowel, compatible with postoperative ileus. - 12/14 abd xray: Persistent diffuse gaseous distension of bowel, compatible with ileus, with increased small bowel distension in comparison to prior. - 12/18 CT: fluid collection in pelvis not definitively abscess. Pericolonic stranding of the splenic flexure and SB mesentery.  - 12/21 abd XRAY:  left basilar atelectasis with superimposed aerated basilar lung. No evidence of bowel obstruction. Gaseous distention of the colon  - 12/24 for CT abd/pelvis: no abscess, Free fluid in peritoneal cavity nearly completely resolved   GI Surgeries / Procedures:  - 12/6 Flexible Sigmoidoscopy: Volvulus. Successful complete decompression achieved - 12/7: sigmoid colectomy  - 12/11: exp lap with irrigation of midline incision - 12/11: NGT placed >> came out on 12/13, replaced 12/14, removed 12/22  Central access:  PICC placement  TPN start date: 12/28/21 PICC placement  Nutritional Goals: Goal TPN rate is 100 mL/hr (provides 144 g of protein and 2400 kcals per day)  RD Assessment: - Samson Frederic, RD indicated that patient is at risk for refeeding syndrome and recom thiamine 100 mg for 5 days; completed 12/12-12/17.  Estimated Needs Total Energy Estimated Needs: 2400-2750 kcals Total Protein  Estimated Needs: 140-170 grams Total Fluid Estimated Needs: >/= 2.1L  Current Nutrition:  12/25 adv to soft diet TPN Ensure Plus BID Plan:  Now: Kphos 30 mMol (44 meq K) - Continue TPN at 100 mL/hr.  - Electrolytes in TPN:  Na 70 mEq/L K 50 mEq/L  Ca 83mq/L  Mg 3 mEq/L Phos 5 mmol/L Cl:Ac 1:1 - Add standard MVI and trace elements to TPN - Continue moderate SSI q4h - MIVF at 20 mL/hr - Monitor TPN labs on Mon/Thurs, BMET Mg & Phos in AM  Thank you for allowing pharmacy to be a part of this patient's care.                            MEudelia Bunch Pharm.D Use secure chat for  questions 01/10/2022 8:19 AM

## 2022-01-10 NOTE — Progress Notes (Addendum)
14 Days Post-Op   Subjective/Chief Complaint: Feeling somewhat better this morning. Notes he had a bowel movement around midnight, and had some blood on the toilet paper at the end of wiping.  Objective: Vital signs in last 24 hours: Temp:  [98.4 F (36.9 C)-99.1 F (37.3 C)] 99.1 F (37.3 C) (12/25 0408) Pulse Rate:  [86-106] 106 (12/25 0408) Resp:  [16-20] 16 (12/25 0408) BP: (117-147)/(76-88) 117/76 (12/25 0408) SpO2:  [97 %-98 %] 97 % (12/25 0408) Last BM Date : 01/09/22  Intake/Output from previous day: 12/24 0701 - 12/25 0700 In: 881.9 [I.V.:881.9] Out: 2040 [Urine:2040] Intake/Output this shift: No intake/output data recorded.  Gen -resting comfortably Resp - effort nonlabored on room air Abd -soft, nondistended, nontender. Wounds shallow and granulating well.  Skin - warm and dry    Lab Results:  Recent Labs    01/09/22 0200 01/10/22 0258  WBC 11.3* 9.7  HGB 12.3* 12.0*  HCT 37.5* 36.6*  PLT 332 310    BMET Recent Labs    01/09/22 0200 01/10/22 0258  NA 136 136  K 4.2 3.9  CL 107 106  CO2 23 24  GLUCOSE 196* 154*  BUN 16 15  CREATININE 0.72 0.72  CALCIUM 8.7* 8.2*    PT/INR No results for input(s): "LABPROT", "INR" in the last 72 hours. ABG No results for input(s): "PHART", "HCO3" in the last 72 hours.  Invalid input(s): "PCO2", "PO2"  Studies/Results: CT ABDOMEN PELVIS W CONTRAST  Result Date: 01/09/2022 CLINICAL DATA:  Postop.  Intra-abdominal abscess. EXAM: CT ABDOMEN AND PELVIS WITH CONTRAST TECHNIQUE: Multidetector CT imaging of the abdomen and pelvis was performed using the standard protocol following bolus administration of intravenous contrast. RADIATION DOSE REDUCTION: This exam was performed according to the departmental dose-optimization program which includes automated exposure control, adjustment of the mA and/or kV according to patient size and/or use of iterative reconstruction technique. CONTRAST:  OMNIPAQUE IOHEXOL 300  MG/ML  SOLN COMPARISON:  01/03/2022.  12/21/2021. FINDINGS: Lower chest: Minor subsegmental atelectasis at the dependent lung bases, similar on the right, improved on the left compared to the prior exam. No acute findings. Hepatobiliary: No focal liver abnormality is seen. No gallstones, gallbladder wall thickening, or biliary dilatation. Pancreas: Unremarkable. No pancreatic ductal dilatation or surrounding inflammatory changes. Spleen: Normal in size without focal abnormality. Adrenals/Urinary Tract: Adrenal glands are unremarkable. Kidneys are normal, without renal calculi, focal lesion, or hydronephrosis. Bladder is unremarkable. Stomach/Bowel: Colon is distended from the cecum to the rectosigmoid, maximum along the ascending colon, 7.5 cm. No colonic wall thickening. Colon and rectum show air-fluid levels. Sigmoid colon anastomosis staple line stable from the prior CT. Mild adjacent trace amount of adjacent fluid and fat stranding. Fluid noted in the pelvis on the prior CT has otherwise resolved. There is no defined collection on the current exam to suggest an abscess. No extraluminal or free air. Stomach is unremarkable. Small bowel is normal in caliber. No wall thickening or inflammation. Vascular/Lymphatic: No significant vascular findings are present. No enlarged abdominal or pelvic lymph nodes. Reproductive: Unremarkable. Other: Midline incision.  No dehiscence.  No hernia. Musculoskeletal: No fracture or acute finding.  No bone lesion. IMPRESSION: 1. Fluid noted in the pelvis on the previous CT has mostly resolved. There is trace fluid adjacent to the sigmoid colon at and just proximal to the anastomosis. No evidence of an abscess. No colonic wall thickening. 2. Diffuse colonic distension, with colonic and rectal air-fluid levels, consistent with a colonic adynamic ileus. No  evidence of obstruction. 3. No extraluminal or free air. Electronically Signed   By: Amie Portland M.D.   On: 01/09/2022 11:04     Anti-infectives: Anti-infectives (From admission, onward)    Start     Dose/Rate Route Frequency Ordered Stop   12/27/21 2130  ceFAZolin (ANCEF) IVPB 2g/100 mL premix        2 g 200 mL/hr over 30 Minutes Intravenous  Once 12/27/21 2039 12/27/21 2138   12/23/21 1400  cefoTEtan (CEFOTAN) 2 g in sodium chloride 0.9 % 100 mL IVPB        2 g 200 mL/hr over 30 Minutes Intravenous 60 min pre-op 12/23/21 1040 12/23/21 1444       Assessment/Plan: Sigmoid volvulus POD 18 s/p sigmoid colectomy, mobilization of splenic flexure Dr. Dossie Der 12/7 POD 14 s/p exploratory laparotomy placement of retention sutures for fascial dehiscence Dr. 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 Pericolonic stranding of the splenic flexure and SB mesentery.  Repeat CT 12/24 without significant fluid collection/mostly resolved; persistend distention of colon c/w adynamic ileus, no obstruction - daily wet to dry dressing changes to midline - tolerating well. abdominal binder - encouraged mobilization and IS use - given OR findings and long post up ileus question possible underlying motility or other GI issue. Recommend evaluation by GI outpatient once recovered from this admission -Try scheduled suppository to help w colon motility   FEN: NGT out.  Try soft diet, continue TPN, ensure ID: cefotetan periop, ancef periop VTE: lovenox Foley: place periop 12/11, dc 12/12, voiding   LOS: 19 days    Berna Bue, MD [facility] 01/10/2022, 8:11 AM Please see Amion for pager number during day hours 7:00am-4:30pm

## 2022-01-10 NOTE — Significant Event (Signed)
Assisting patient to shower. While drying patient states He is not feeling good.  Patient  start breathing really fast and says he feel like he is going to pass out.  With the use of the shower rails he was able to sit to the floor. He was clammy and pale; team/help was called. BP while sitting on the Shower floor BP was 133/88 HR 129 o2 98%.  Rapid response nurse was called. once patient stated he felt better he was able to sit on bedside commode in bathroom and BP was 64/24 Heart Rate 121.  Blood Glucose 175. Rapid response nurse arrived and administered fluids. 1020- Nathaniel Cantor MD notified. Patient states he buttocks hurts.  Once BP stabilized we transfer to wheelchair.  Bp 127/67 Heart rate 103 O2 98. From wheelchair to bed.  Patient states his left ankle hurts and buttocks.

## 2022-01-10 NOTE — Significant Event (Signed)
Rapid Response Event Note   Reason for Call : Syncopal episode  with fall    Initial Focused Assessment:  Patient alert and oriented x 4, Patient reports that he was not feeling well and told staff that he was going to pass out. See vital sign sheet.      Interventions:  Bolus NS     MD Notified:  Dr.  Call Time: 1025  Arrival Time: 1030  End Time: 1100   Sharyn Lull Aubryn Spinola, RN

## 2022-01-11 LAB — BASIC METABOLIC PANEL
Anion gap: 6 (ref 5–15)
BUN: 13 mg/dL (ref 6–20)
CO2: 23 mmol/L (ref 22–32)
Calcium: 8.4 mg/dL — ABNORMAL LOW (ref 8.9–10.3)
Chloride: 105 mmol/L (ref 98–111)
Creatinine, Ser: 0.65 mg/dL (ref 0.61–1.24)
GFR, Estimated: >60 mL/min (ref >60)
Glucose, Bld: 147 mg/dL — ABNORMAL HIGH (ref 70–99)
Potassium: 4 mmol/L (ref 3.5–5.1)
Sodium: 134 mmol/L — ABNORMAL LOW (ref 135–145)

## 2022-01-11 LAB — GLUCOSE, CAPILLARY
Glucose-Capillary: 143 mg/dL — ABNORMAL HIGH (ref 70–99)
Glucose-Capillary: 144 mg/dL — ABNORMAL HIGH (ref 70–99)
Glucose-Capillary: 150 mg/dL — ABNORMAL HIGH (ref 70–99)
Glucose-Capillary: 151 mg/dL — ABNORMAL HIGH (ref 70–99)
Glucose-Capillary: 157 mg/dL — ABNORMAL HIGH (ref 70–99)
Glucose-Capillary: 159 mg/dL — ABNORMAL HIGH (ref 70–99)

## 2022-01-11 LAB — PHOSPHORUS: Phosphorus: 2.3 mg/dL — ABNORMAL LOW (ref 2.5–4.6)

## 2022-01-11 LAB — MAGNESIUM: Magnesium: 1.9 mg/dL (ref 1.7–2.4)

## 2022-01-11 MED ORDER — BOOST / RESOURCE BREEZE PO LIQD CUSTOM
1.0000 | Freq: Three times a day (TID) | ORAL | Status: DC
  Administered 2022-01-11: 1 via ORAL

## 2022-01-11 MED ORDER — METOCLOPRAMIDE HCL 5 MG PO TABS
5.0000 mg | ORAL_TABLET | Freq: Three times a day (TID) | ORAL | Status: AC
  Administered 2022-01-11 – 2022-01-14 (×12): 5 mg via ORAL
  Filled 2022-01-11 (×12): qty 1

## 2022-01-11 MED ORDER — ALTEPLASE 2 MG IJ SOLR: 2.0000 mg | Freq: Once | INTRAMUSCULAR | Status: DC

## 2022-01-11 MED ORDER — POLYETHYLENE GLYCOL 3350 17 G PO PACK
17.0000 g | PACK | Freq: Two times a day (BID) | ORAL | Status: DC
  Administered 2022-01-11 – 2022-01-18 (×10): 17 g via ORAL
  Filled 2022-01-11 (×12): qty 1

## 2022-01-11 MED ORDER — TRAVASOL 10 % IV SOLN
INTRAVENOUS | Status: AC
  Filled 2022-01-11: qty 1461.6

## 2022-01-11 MED ORDER — SODIUM CHLORIDE 0.9 % IV SOLN
250.0000 mg | Freq: Three times a day (TID) | INTRAVENOUS | Status: DC
  Administered 2022-01-11 – 2022-01-19 (×23): 250 mg via INTRAVENOUS
  Filled 2022-01-11 (×34): qty 5

## 2022-01-11 MED ORDER — ACETAMINOPHEN 500 MG PO TABS
1000.0000 mg | ORAL_TABLET | Freq: Four times a day (QID) | ORAL | Status: DC
  Administered 2022-01-11 – 2022-01-16 (×18): 1000 mg via ORAL
  Filled 2022-01-11 (×20): qty 2

## 2022-01-11 MED ORDER — SODIUM PHOSPHATES 45 MMOLE/15ML IV SOLN
30.0000 mmol | Freq: Once | INTRAVENOUS | Status: AC
  Administered 2022-01-11: 30 mmol via INTRAVENOUS
  Filled 2022-01-11: qty 10

## 2022-01-11 NOTE — Plan of Care (Signed)
Patient alert and oriented  x 4. Attempted to use the BSC twice but no result. Denies pain. Reports nausea. Medicated per MAR. Abdominal wound dressing dry and intact. TPN infusing. VS and CBG monitored. Safety and fall precautions observed.  Problem: Education: Goal: Knowledge of condition and prescribed therapy will improve Outcome: Progressing   Problem: Cardiac: Goal: Will achieve and/or maintain adequate cardiac output Outcome: Progressing   Problem: Physical Regulation: Goal: Complications related to the disease process, condition or treatment will be avoided or minimized Outcome: Progressing   Problem: Elimination: Goal: Will not experience complications related to bowel motility Outcome: Progressing Goal: Will not experience complications related to urinary retention Outcome: Progressing   Problem: Pain Managment: Goal: General experience of comfort will improve Outcome: Progressing   Problem: Safety: Goal: Ability to remain free from injury will improve Outcome: Progressing   Problem: Skin Integrity: Goal: Risk for impaired skin integrity will decrease Outcome: Progressing

## 2022-01-11 NOTE — Progress Notes (Signed)
Nutrition Follow-up  DOCUMENTATION CODES:   Not applicable  INTERVENTION:  - Soft diet per MD.  - Boost Breeze po TID, each supplement provides 250 kcal and 9 grams of protein - Encourage intake as tolerated.  - 48 hour Calorie count to run from Breakfast 12/26 through [facility] 12/27.  - RN to document intake of meals.  - Plan for TPN to remain at goal at this time.  - TPN management per Pharmacy   - Monitor weights daily while on TPN.     NUTRITION DIAGNOSIS:   Inadequate oral intake related to altered GI function (ileus) as evidenced by NPO status, other (comment) (starting TPN). *ongoing  GOAL:   Patient will meet greater than or equal to 90% of their needs *being met with TPN, calorie count pending  MONITOR:   Labs, Weight trends, Diet advancement  REASON FOR ASSESSMENT:   Consult Calorie Count  ASSESSMENT:   41 y.o. 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  12/5: admitted 12/6: flex sigmoidoscopy, CLD 12/7: NPO for sigmoid colectomy and mobilization of splenic flexure, ->CLD 12/11: FLD -> NPO for ex lap, NGT placed 12/12: PICC placed, TPN initiated 12/13: NGT out 12/14: NGT replaced 12/15: TPN advanced to goal 12/22: NGT removed 12/25: Diet advanced to Soft 12/26: Calorie count starting   TPN remains at goal: 146g protein, 378g CHO, 630 IL calories = 2500 calories/day  Diet advanced to soft yesterday. Per discussion with RN patient taking in very little.  Patient reports he has no desire to eat and has been experiencing nausea. Has only been able to take in gatorade and a couple bites of crackers. Encouraged intake as tolerated, increasing at each meal as able. Patient had been ordered Ensure but not regularly consuming. He admits he doesn't like it very much but thankfully agreeable to try Boost Breeze instead.  Calorie count to run from breakfast 12/26 through dinner 12/27.  Discussed  plan to start calorie count with RN.    Admission weight: 254 lbs Current weight: 243 lbs *Could represent possible weight loss since admit   Medications reviewed and include: Dulcolax, Insulin, Reglan, Miralax, Senokot, Phenergan prn  Labs reviewed:  Na 134 Phosphorus 2.3 Triglycerides 78 (12/25)    Diet Order:   Diet Order             DIET SOFT Room service appropriate? Yes; Fluid consistency: Thin  Diet effective now                   EDUCATION NEEDS:  Education needs have been addressed  Skin:  Skin Assessment: Reviewed RN Assessment Skin Integrity Issues:: Incisions Incisions: Abdomen  Last BM:  12/25  Height:  Ht Readings from Last 1 Encounters:  12/23/21 _0  (1.854 m)   Weight:  Wt Readings from Last 1 Encounters:  01/11/22 110.2 kg   Ideal Body Weight:  83.6 kg  BMI:  Body mass index is 32.05 kg/m.  Estimated Nutritional Needs:  Kcal:  7903-8333 kcals Protein:  140-170 grams Fluid:  >/= 2.1L    Samson Frederic RD, LDN For contact information, refer to [facility].

## 2022-01-11 NOTE — Progress Notes (Signed)
Chaplain met with Nathaniel Hanson and provided support.  This hospitalization has been hard on him physically and emotionally.  He has not had the support he was hoping for from his brother and his brother's girlfriend whom he lives with.  He has felt well supported by his boss and is hoping that he might be able to stay at the [employer] where he works while he is recuperating rather than return home. He is waiting to hear back from his boss about this.  Chaplain provided spiritual support through listening as well as prayer.  Chaplains will continue to follow, but please page as needs arise or as [place] requests.  [address] [place] Pager, [phone]

## 2022-01-11 NOTE — Progress Notes (Signed)
[facility] Progress Note  Patient: Nathaniel Hanson     JQB:[id number]  DOA: 12/21/2021   PCP: Creola Corn, MD       Brief hospital course: This is a 41 year old male who presented to the hospital after sigmoid volvulus which caused syncope. He underwent a flex sigmoidoscopy on 12/6 without success. On 12/7, the patient underwent a sigmoid colectomy and mobilization of the splenic flexure.  His postop course has been complicated by a persistent ileus.  He was noted to have fascial dehiscence and return to the OR on 12/11 for placement of retention sutures.  Currently he continues to have an ileus.  Subjective:  No new complaints.  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    Code Status: Full Code Consultants: General surgery Level of Care: Level of care: Med-Surg Total time on patient care: 15 minutes DVT prophylaxis:  enoxaparin (LOVENOX) injection 40 mg Start: 12/24/21 1000 SCDs Start: 12/22/21 0028     Objective:   Vitals:   01/10/22 2036 01/11/22 0419 01/11/22 0650 01/11/22 1339  BP: (!) 143/88 133/82  112/70  Pulse: 81 (!) 101  88  Resp: 18 18  18   Temp: 98.6 F (37 C) 98 F (36.7 C)  97.7 F (36.5 C)  TempSrc: Oral Oral  Oral  SpO2: 100% 98%  98%  Weight:   110.2 kg   Height:       Filed Weights   12/28/21 0533 12/28/21 0915 01/11/22 0650  Weight: 119.4 kg 113.6 kg 110.2 kg   Exam: General exam: Appears comfortable  HEENT:  oral mucosa moist Respiratory system: Clear to auscultation.  Cardiovascular system: S1 & S2 heard  Gastrointestinal system: Abdomen soft, non-tender, nondistended. Normal bowel sounds   Extremities: No cyanosis, clubbing or edema Psychiatry:  Mood & affect appropriate.   CBC: Recent Labs  Lab 01/05/22 0503 01/06/22 0408 01/08/22 0408 01/09/22 0200 01/10/22 0258  WBC 14.6* 13.6* 9.4 11.3* 9.7  HGB 13.2 13.2 11.8* 12.3* 12.0*  HCT 41.3 42.3 37.8* 37.5* 36.6*  MCV 87.9 88.5 89.2 86.8 87.4  PLT 391 392 321 332 310    Basic Metabolic Panel: Recent Labs  Lab 01/07/22 0439 01/08/22 0408 01/09/22 0200 01/10/22 0258 01/11/22 0322  NA 140 136 136 136 134*  K 4.1 3.8 4.2 3.9 4.0  CL 110 107 107 106 105  CO2 24 24 23 24 23   GLUCOSE 135* 130* 196* 154* 147*  BUN 25* 20 16 15 13   CREATININE 0.78 0.88 0.72 0.72 0.65  CALCIUM 8.8* 8.5* 8.7* 8.2* 8.4*  MG 2.1 1.9 2.0 2.0 1.9  PHOS 3.8 3.4 2.1* 1.7* 2.3*    GFR: Estimated Creatinine Clearance: 159.7 mL/min (by C-G formula based on SCr of 0.65 mg/dL).  Scheduled Meds:  acetaminophen  1,000 mg Oral Q6H   bisacodyl  10 mg Rectal Daily   Chlorhexidine Gluconate Cloth  6 each Topical Daily   enoxaparin (LOVENOX) injection  40 mg Subcutaneous Q24H  feeding supplement  1 Container Oral TID BM   insulin aspart  0-15 Units Subcutaneous Q4H   metoCLOPramide  5 mg Oral TID AC & HS   polyethylene glycol  17 g Oral BID   senna-docusate  1 tablet Oral QHS   sodium chloride flush  10-40 mL Intracatheter Q12H   sodium chloride flush  3 mL Intravenous Q12H   Continuous Infusions:  sodium chloride 20 mL/hr at 01/07/22 0903   erythromycin     methocarbamol (ROBAXIN) IV     promethazine (PHENERGAN) injection (IM or IVPB) 6.25 mg (01/11/22 0121)   sodium chloride     TPN ADULT (ION) 100 mL/hr at 01/10/22 1824   TPN ADULT (ION)     Imaging and lab data was personally reviewed No results found.  LOS: 20 days   Author: Calvert Cantor   01/11/2022 3:27 PM  To contact [facility]>   Check the care team in [facility] and look for the attending/consulting [facility] provider listed  Log into [email or web address] and use [facility]'s universal password   Go to> "[facility]"  and find provider  If you still have difficulty reaching the provider, please page the [facility] (Director on Call) for the Hospitalists listed on amion

## 2022-01-11 NOTE — Progress Notes (Signed)
PHARMACY - TOTAL PARENTERAL NUTRITION CONSULT NOTE   Indication: Prolonged ileus  Patient Measurements: Height: _0  (185.4 cm) Weight: 110.2 kg (242 lb 15.2 oz) IBW/kg (Calculated) : 79.9 TPN AdjBW (KG): 88.9 Body mass index is 32.05 kg/m. Usual Weight:   Assessment: Patient is a 41 y.o M who presented to the ED on 12/21/21 for evaluation after he experienced syncope and blacked out at home.  Abdominal CT on 12/21/21 showed findings consistent with sigmoid volvulus.He underwent sigmoidoscopy on 12/22/21 with decompression of the volvulus. However, he still had significant sigmoid colon dilation s/p procedure and subsequently underwent sigmoid colectomy on 12/23/21. On 12/11, he had bleeding from surgical incision site as well as n/v and was found to have "fascial dehiscence and near evisceration of his bowel."  He was taken to the OR emergently on 12/27/21 for  exp lap with irrigation of the midline incision.  Abd x-ray on 12/27/21 showed findings consistent with post-op ileus. Fascial dehiscence. Pharmacy has been consulted to start TPN.  Recent Labs    01/10/22 0258 01/11/22 0322  NA 136 134*  K 3.9 4.0  CL 106 105  CO2 24 23  GLUCOSE 154* 147*  BUN 15 13  CREATININE 0.72 0.65  CALCIUM 8.2* 8.4*  PHOS 1.7* 2.3*  MG 2.0 1.9  ALBUMIN 2.8*  --   ALKPHOS 107  --   AST 30  --   ALT 94*  --   BILITOT 0.6  --   TRIG 78  --     Glucose / Insulin: no hx DM - on mSSI q4h (14 units insulin given in the past 24 hrs) - goal cbgs <150: 130-170s over previous 24hrs Electrolytes: wnl exc Phos low at 2.3 - Ileus resolved Renal: Scr wnl, BUN WNL, UOP good Hepatic:  AST, Tbili and Alk Phos WNL, ALT sl high - albumin 2.8 - TG 78 (12/25) Intake / Output; MIVF: NS at 20 ml/hr  - I/O: - 858 mL - UOP: 1740 ml - last BM on 12/24 GI Imaging: - 12/5 abd CT: Findings compatible with sigmoid volvulus. - 12/11 abd xray: Diffuse gaseous distention of bowel, compatible with postoperative  ileus. - 12/14 abd xray: Persistent diffuse gaseous distension of bowel, compatible with ileus, with increased small bowel distension in comparison to prior. - 12/18 CT: fluid collection in pelvis not definitively abscess. Pericolonic stranding of the splenic flexure and SB mesentery.  - 12/21 abd XRAY:  left basilar atelectasis with superimposed aerated basilar lung. No evidence of bowel obstruction. Gaseous distention of the colon  - 12/24 for CT abd/pelvis: no abscess, Free fluid in peritoneal cavity nearly completely resolved   GI Surgeries / Procedures:  - 12/6 Flexible Sigmoidoscopy: Volvulus. Successful complete decompression achieved - 12/7: sigmoid colectomy  - 12/11: exp lap with irrigation of midline incision - 12/11: NGT placed >> came out on 12/13, replaced 12/14, removed 12/22  Central access:  PICC placement  TPN start date: 12/28/21 PICC placement  Nutritional Goals: Goal TPN rate is 105 mL/hr (provides 146 g of protein and 2500 kcals per day)  RD Assessment: refeeding syndrome s/p thiamine 100 mg for 5 days; completed 12/12-12/17.  Estimated Needs Total Energy Estimated Needs: 4166-0630 kcals Total Protein Estimated Needs: 140-170 grams Total Fluid Estimated Needs: >/= 2.1L  Current Nutrition:  12/25 adv to soft diet TPN Ensure Plus BID  Plan:  NaPhos 30 mMol x 1  Continue TPN at 100 mL/hr goal - Electrolytes in TPN:  Na 70 mEq/L K  50 mEq/L  Ca 5 mEq/L  Mg 5 mEq/L Phos 10 mmol/L Cl:Ac 1:1 - Add standard MVI and trace elements to TPN - Continue moderate SSI q4h - MIVF at 20 mL/hr - Monitor TPN labs on Mon/Thurs, Phos in AM  Thank you for allowing pharmacy to be a part of this patient's care.                            Elenor Quinones, PharmD, BCPS, BCIDP Clinical Pharmacist 01/11/2022 7:37 AM

## 2022-01-11 NOTE — Progress Notes (Signed)
15 Days Post-Op   Subjective/Chief Complaint: Feels about the same.  Persistent nausea, no bowel movement despite suppository yesterday.  Had a presyncopal episode in the shower but denies hitting his head.  Objective: Vital signs in last 24 hours: Temp:  [97.9 F (36.6 C)-98.7 F (37.1 C)] 98 F (36.7 C) (12/26 0419) Pulse Rate:  [81-104] 101 (12/26 0419) Resp:  [16-18] 18 (12/26 0419) BP: (127-143)/(79-88) 133/82 (12/26 0419) SpO2:  [98 %-100 %] 98 % (12/26 0419) Weight:  [110.2 kg] 110.2 kg (12/26 0650) Last BM Date : 01/10/22  Intake/Output from previous day: 12/25 0701 - 12/26 0700 In: 2506.1 [P.O.:600; I.V.:1356.1; IV Piggyback:550] Out: 2200 [Urine:2200] Intake/Output this shift: No intake/output data recorded.  Gen -resting comfortably Resp - effort nonlabored on room air Abd -soft, minimally distended, nontender. Wounds shallow and granulating well.  Skin - warm and dry    Lab Results:  Recent Labs    01/09/22 0200 01/10/22 0258  WBC 11.3* 9.7  HGB 12.3* 12.0*  HCT 37.5* 36.6*  PLT 332 310    BMET Recent Labs    01/10/22 0258 01/11/22 0322  NA 136 134*  K 3.9 4.0  CL 106 105  CO2 24 23  GLUCOSE 154* 147*  BUN 15 13  CREATININE 0.72 0.65  CALCIUM 8.2* 8.4*    PT/INR No results for input(s): "LABPROT", "INR" in the last 72 hours. ABG No results for input(s): "PHART", "HCO3" in the last 72 hours.  Invalid input(s): "PCO2", "PO2"  Studies/Results: CT ABDOMEN PELVIS W CONTRAST  Result Date: 01/09/2022 CLINICAL DATA:  Postop.  Intra-abdominal abscess. EXAM: CT ABDOMEN AND PELVIS WITH CONTRAST TECHNIQUE: Multidetector CT imaging of the abdomen and pelvis was performed using the standard protocol following bolus administration of intravenous contrast. RADIATION DOSE REDUCTION: This exam was performed according to the departmental dose-optimization program which includes automated exposure control, adjustment of the mA and/or kV according to  patient size and/or use of iterative reconstruction technique. CONTRAST:  OMNIPAQUE IOHEXOL 300 MG/ML  SOLN COMPARISON:  01/03/2022.  12/21/2021. FINDINGS: Lower chest: Minor subsegmental atelectasis at the dependent lung bases, similar on the right, improved on the left compared to the prior exam. No acute findings. Hepatobiliary: No focal liver abnormality is seen. No gallstones, gallbladder wall thickening, or biliary dilatation. Pancreas: Unremarkable. No pancreatic ductal dilatation or surrounding inflammatory changes. Spleen: Normal in size without focal abnormality. Adrenals/Urinary Tract: Adrenal glands are unremarkable. Kidneys are normal, without renal calculi, focal lesion, or hydronephrosis. Bladder is unremarkable. Stomach/Bowel: Colon is distended from the cecum to the rectosigmoid, maximum along the ascending colon, 7.5 cm. No colonic wall thickening. Colon and rectum show air-fluid levels. Sigmoid colon anastomosis staple line stable from the prior CT. Mild adjacent trace amount of adjacent fluid and fat stranding. Fluid noted in the pelvis on the prior CT has otherwise resolved. There is no defined collection on the current exam to suggest an abscess. No extraluminal or free air. Stomach is unremarkable. Small bowel is normal in caliber. No wall thickening or inflammation. Vascular/Lymphatic: No significant vascular findings are present. No enlarged abdominal or pelvic lymph nodes. Reproductive: Unremarkable. Other: Midline incision.  No dehiscence.  No hernia. Musculoskeletal: No fracture or acute finding.  No bone lesion. IMPRESSION: 1. Fluid noted in the pelvis on the previous CT has mostly resolved. There is trace fluid adjacent to the sigmoid colon at and just proximal to the anastomosis. No evidence of an abscess. No colonic wall thickening. 2. Diffuse colonic distension, with  colonic and rectal air-fluid levels, consistent with a colonic adynamic ileus. No evidence of obstruction. 3. No  extraluminal or free air. Electronically Signed   By: Amie Portland M.D.   On: 01/09/2022 11:04    Anti-infectives: Anti-infectives (From admission, onward)    Start     Dose/Rate Route Frequency Ordered Stop   12/27/21 2130  ceFAZolin (ANCEF) IVPB 2g/100 mL premix        2 g 200 mL/hr over 30 Minutes Intravenous  Once 12/27/21 2039 12/27/21 2138   12/23/21 1400  cefoTEtan (CEFOTAN) 2 g in sodium chloride 0.9 % 100 mL IVPB        2 g 200 mL/hr over 30 Minutes Intravenous 60 min pre-op 12/23/21 1040 12/23/21 1444       Assessment/Plan: Sigmoid volvulus POD 19 s/p sigmoid colectomy, mobilization of splenic flexure Dr. Dossie Der 12/7 POD 15 s/p exploratory laparotomy placement of retention sutures for fascial dehiscence Dr. 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 Pericolonic stranding of the splenic flexure and SB mesentery.  Repeat CT 12/24 without significant fluid collection/mostly resolved; persistend distention of colon c/w adynamic ileus, no obstruction - daily wet to dry dressing changes to midline - tolerating well. abdominal binder - encouraged mobilization and IS use - given OR findings and long post up ileus question possible underlying motility or other GI issue. Recommend evaluation by GI outpatient once recovered from this admission -Try scheduled suppository to help w colon motility   FEN: NGT out.  Try soft diet, continue TPN, ensure ID: cefotetan periop, ancef periop VTE: lovenox Foley: place periop 12/11, dc 12/12, voiding   LOS: 20 days    Berna Bue, MD [facility] 01/11/2022, 8:22 AM Please see Amion for pager number during day hours 7:00am-4:30pm

## 2022-01-12 LAB — GLUCOSE, CAPILLARY
Glucose-Capillary: 155 mg/dL — ABNORMAL HIGH (ref 70–99)
Glucose-Capillary: 157 mg/dL — ABNORMAL HIGH (ref 70–99)
Glucose-Capillary: 157 mg/dL — ABNORMAL HIGH (ref 70–99)
Glucose-Capillary: 163 mg/dL — ABNORMAL HIGH (ref 70–99)
Glucose-Capillary: 167 mg/dL — ABNORMAL HIGH (ref 70–99)
Glucose-Capillary: 192 mg/dL — ABNORMAL HIGH (ref 70–99)

## 2022-01-12 LAB — PHOSPHORUS: Phosphorus: 3.4 mg/dL (ref 2.5–4.6)

## 2022-01-12 MED ORDER — PROSOURCE PLUS PO LIQD
30.0000 mL | Freq: Three times a day (TID) | ORAL | Status: DC
  Administered 2022-01-12 – 2022-01-18 (×10): 30 mL via ORAL
  Filled 2022-01-12 (×14): qty 30

## 2022-01-12 MED ORDER — TRAVASOL 10 % IV SOLN
INTRAVENOUS | Status: AC
  Filled 2022-01-12: qty 1461.6

## 2022-01-12 MED ORDER — ZOLPIDEM TARTRATE 5 MG PO TABS
5.0000 mg | ORAL_TABLET | Freq: Every evening | ORAL | Status: DC | PRN
  Administered 2022-01-13 – 2022-01-17 (×6): 5 mg via ORAL
  Filled 2022-01-12 (×6): qty 1

## 2022-01-12 NOTE — Progress Notes (Signed)
Calorie Count Note  48 hour calorie count ordered.  Diet: Soft Supplements: Boost Breeze TID  Day 1 (12/26): *Nothing documented on calorie count sheet Breakfast: no breakfast tray ordered Lunch: 5% of mashed potatoes = 7 kcals, 0g protein Dinner: no dinner tray ordered Supplements: 1 Boost Breeze = 250 kcals, 9g protein  Total intake: 257 kcal (11% of minimum estimated needs)  9 protein (6% of minimum estimated needs)  Nutrition Dx: Inadequate oral intake related to altered GI function (ileus) as evidenced by NPO status, other (comment) (on TPN).   Goal: Patient will meet greater than or equal to 90% of their needs - orally.  Subjective:  No intake documented on calorie count sheet. Patient did not have second Boost Breeze yesterday or supplement this morning so will trial different supplements.   Intervention:  - Magic cup TID with meals, each supplement provides 290 kcal and 9 grams of protein - Prosource Plus TID, each supplement provides 100 kcal and 15g protein. - Encourage intake as tolerated.   - 48 hour Calorie count to continue through the end of today.  - Please document % intake of all food, drinks, and supplements patient consumes.  - RN to document intake of meals.   - Plan for TPN to remain at goal of 114mL/hr at this time.  - TPN management per Pharmacy   Shelle Iron RD, LDN For contact information, refer to [facility].

## 2022-01-12 NOTE — Progress Notes (Signed)
  Progress Note   Patient: Nathaniel Hanson ZJQ:[id number] DOB: [birth year] DOA: 12/21/2021     21 DOS: the patient was seen and examined on 01/12/2022   Brief hospital course: 41 year old male who presented to the hospital after sigmoid volvulus which caused syncope. He underwent a flex sigmoidoscopy on 12/6 without success. On 12/7, the patient underwent a sigmoid colectomy and mobilization of the splenic flexure.  His postop course has been complicated by a persistent ileus.  He was noted to have fascial dehiscence and return to the OR on 12/11 for placement of retention sutures.  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      Active Problems: Mildly elevated LFTs  -he has no prior history of elevation - improved   Glucose intolerance - A1c is 5.7 - Cont diet/lifestyle modification     Syncope -Cause unclear but suspected to be vasovagal vs deconditioning from prolonged laying in bed this visit -Will consult PT   Hyponatremia, sinus tachycardia and dehydration - Resolved   Obesity Body mass index is 32.05 kg/m.      Subjective: Complains of continued lack of appetite  Physical Exam: Vitals:   01/11/22 1339 01/11/22 2030 01/12/22 0423 01/12/22 1252  BP: 112/70 (!) 144/88 (!) 136/101 (!) 150/91  Pulse: 88 80 (!) 107 (!) 102  Resp: 18 20 20 18   Temp: 97.7 F (36.5 C) 99.9 F (37.7 C) 98.8 F (37.1 C) 98.8 F (37.1 C)  TempSrc: Oral Oral Oral Oral  SpO2: 98% 100% 97% 96%  Weight:      Height:       General exam: Awake, laying in bed, in nad Respiratory system: Normal respiratory effort, no wheezing Cardiovascular system: regular rate, s1, s2 Gastrointestinal system: Soft, nondistended, positive BS Central nervous system: CN2-12 grossly intact, strength  intact Extremities: Perfused, no clubbing Skin: Normal skin turgor, no notable skin lesions seen Psychiatry: Mood normal // no visual hallucinations   Data Reviewed:  Labs reviewed: Na 134, K 4.0, Cr 0.65, Mg 1.9  Family Communication: Pt in room, family not at bedside  Disposition: Status is: Inpatient Remains inpatient appropriate because: Severity of illness  Planned Discharge Destination: Home    Author: , MD 01/12/2022 5:26 PM  For on call review [email or web address].

## 2022-01-12 NOTE — Plan of Care (Signed)
  Problem: Education: Goal: Knowledge of condition and prescribed therapy will improve Outcome: Progressing   Problem: Cardiac: Goal: Will achieve and/or maintain adequate cardiac output Outcome: Progressing   Problem: Physical Regulation: Goal: Complications related to the disease process, condition or treatment will be avoided or minimized Outcome: Progressing   Problem: Education: Goal: Knowledge of General Education information will improve Description: Including pain rating scale, medication(s)/side effects and non-pharmacologic comfort measures Outcome: Progressing   Problem: Health Behavior/Discharge Planning: Goal: Ability to manage health-related needs will improve Outcome: Progressing   Problem: Clinical Measurements: Goal: Ability to maintain clinical measurements within normal limits will improve Outcome: Progressing Goal: Will remain free from infection Outcome: Progressing Goal: Diagnostic test results will improve Outcome: Progressing Goal: Respiratory complications will improve Outcome: Progressing Goal: Cardiovascular complication will be avoided Outcome: Progressing   Problem: Activity: Goal: Risk for activity intolerance will decrease Outcome: Progressing   Problem: Nutrition: Goal: Adequate nutrition will be maintained Outcome: Progressing   Problem: Coping: Goal: Level of anxiety will decrease Outcome: Progressing   Problem: Elimination: Goal: Will not experience complications related to bowel motility Outcome: Progressing Goal: Will not experience complications related to urinary retention Outcome: Progressing   Problem: Pain Managment: Goal: General experience of comfort will improve Outcome: Progressing   Problem: Safety: Goal: Ability to remain free from injury will improve Outcome: Progressing   Problem: Skin Integrity: Goal: Risk for impaired skin integrity will decrease Outcome: Progressing   Problem: Education: Goal:  Understanding of discharge needs will improve Outcome: Progressing Goal: Verbalization of understanding of the causes of altered bowel function will improve Outcome: Progressing   Problem: Activity: Goal: Ability to tolerate increased activity will improve Outcome: Progressing   Problem: Bowel/Gastric: Goal: Gastrointestinal status for postoperative course will improve Outcome: Progressing   Problem: Health Behavior/Discharge Planning: Goal: Identification of community resources to assist with postoperative recovery needs will improve Outcome: Progressing   Problem: Nutritional: Goal: Will attain and maintain optimal nutritional status will improve Outcome: Progressing   Problem: Clinical Measurements: Goal: Postoperative complications will be avoided or minimized Outcome: Progressing   Problem: Respiratory: Goal: Respiratory status will improve Outcome: Progressing   Problem: Skin Integrity: Goal: Will show signs of wound healing Outcome: Progressing

## 2022-01-12 NOTE — Progress Notes (Signed)
16 Days Post-Op   Subjective/Chief Complaint: Still with bloating, nausea. Reports no flatus since Monday Getting daily suppositories now Denies abdominal pain   Objective: Vital signs in last 24 hours: Temp:  [97.7 F (36.5 C)-99.9 F (37.7 C)] 98.8 F (37.1 C) (12/27 0423) Pulse Rate:  [80-107] 107 (12/27 0423) Resp:  [18-20] 20 (12/27 0423) BP: (112-144)/(70-101) 136/101 (12/27 0423) SpO2:  [97 %-100 %] 97 % (12/27 0423) Last BM Date : 01/11/22 (per pt)  Intake/Output from previous day: 12/26 0701 - 12/27 0700 In: 3026.9 [P.O.:300; I.V.:2576.9; IV Piggyback:150] Out: 3100 [Urine:3100] Intake/Output this shift: No intake/output data recorded.  Exam: Awake and alert Abdomen distended, non-tender, wound clean  Lab Results:  Recent Labs    01/10/22 0258  WBC 9.7  HGB 12.0*  HCT 36.6*  PLT 310   BMET Recent Labs    01/10/22 0258 01/11/22 0322  NA 136 134*  K 3.9 4.0  CL 106 105  CO2 24 23  GLUCOSE 154* 147*  BUN 15 13  CREATININE 0.72 0.65  CALCIUM 8.2* 8.4*   PT/INR No results for input(s): "LABPROT", "INR" in the last 72 hours. ABG No results for input(s): "PHART", "HCO3" in the last 72 hours.  Invalid input(s): "PCO2", "PO2"  Studies/Results: No results found.  Anti-infectives: Anti-infectives (From admission, onward)    Start     Dose/Rate Route Frequency Ordered Stop   01/11/22 1400  erythromycin 250 mg in sodium chloride 0.9 % 100 mL IVPB        250 mg 100 mL/hr over 60 Minutes Intravenous Every 8 hours 01/11/22 1058     12/27/21 2130  ceFAZolin (ANCEF) IVPB 2g/100 mL premix        2 g 200 mL/hr over 30 Minutes Intravenous  Once 12/27/21 2039 12/27/21 2138   12/23/21 1400  cefoTEtan (CEFOTAN) 2 g in sodium chloride 0.9 % 100 mL IVPB        2 g 200 mL/hr over 30 Minutes Intravenous 60 min pre-op 12/23/21 1040 12/23/21 1444       Assessment/Plan: Sigmoid volvulus POD 19 s/p sigmoid colectomy, mobilization of splenic flexure Dr.  Dossie Der 12/7 POD 15 s/p exploratory laparotomy placement of retention sutures for fascial dehiscence Dr. 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 Pericolonic stranding of the splenic flexure and SB mesentery.  Repeat CT 12/24 without significant fluid collection/mostly resolved; persistend distention of colon c/w adynamic ileus, no obstruction  Continue TNA, ambulate Continue reglan, suppos Wound care  Abigail Miyamoto MD 01/12/2022

## 2022-01-12 NOTE — Progress Notes (Addendum)
PHARMACY - TOTAL PARENTERAL NUTRITION CONSULT NOTE   Indication: Prolonged ileus  Patient Measurements: Height: _0  (185.4 cm) Weight: 110.2 kg (242 lb 15.2 oz) IBW/kg (Calculated) : 79.9 TPN AdjBW (KG): 88.9 Body mass index is 32.05 kg/m. Usual Weight:   Assessment: Patient is a 41 y.o M who presented to the ED on 12/21/21 for evaluation after he experienced syncope and blacked out at home.  Abdominal CT on 12/21/21 showed findings consistent with sigmoid volvulus.He underwent sigmoidoscopy on 12/22/21 with decompression of the volvulus. However, he still had significant sigmoid colon dilation s/p procedure and subsequently underwent sigmoid colectomy on 12/23/21. On 12/11, he had bleeding from surgical incision site as well as n/v and was found to have "fascial dehiscence and near evisceration of his bowel."  He was taken to the OR emergently on 12/27/21 for  exp lap with irrigation of the midline incision.  Abd x-ray on 12/27/21 showed findings consistent with post-op ileus. Fascial dehiscence. Pharmacy has been consulted to start TPN.  Recent Labs    01/10/22 0258 01/11/22 0322 01/12/22 0356  NA 136 134*  --   K 3.9 4.0  --   CL 106 105  --   CO2 24 23  --   GLUCOSE 154* 147*  --   BUN 15 13  --   CREATININE 0.72 0.65  --   CALCIUM 8.2* 8.4*  --   PHOS 1.7* 2.3* 3.4  MG 2.0 1.9  --   ALBUMIN 2.8*  --   --   ALKPHOS 107  --   --   AST 30  --   --   ALT 94*  --   --   BILITOT 0.6  --   --   TRIG 78  --   --     Glucose / Insulin: no hx DM - on mSSI q4h (9 units insulin given in the past 24 hrs) - goal cbgs <150: 144-167 over previous 24hrs Electrolytes: Phos wnl today after NaPhos IV yesterday. - Ileus resolved Renal: 12/26: Scr wnl, BUN WNL. UOP good Hepatic:  12/25:  - AST, Tbili and Alk Phos WNL, ALT sl high - albumin 2.8 - TG 78  Intake / Output; MIVF: NS at 20 ml/hr  - I/O: incomplete - UOP: 675 ml charted - last BM on 12/26 GI Imaging: - 12/5 abd CT:  Findings compatible with sigmoid volvulus. - 12/11 abd xray: Diffuse gaseous distention of bowel, compatible with postoperative ileus. - 12/14 abd xray: Persistent diffuse gaseous distension of bowel, compatible with ileus, with increased small bowel distension in comparison to prior. - 12/18 CT: fluid collection in pelvis not definitively abscess. Pericolonic stranding of the splenic flexure and SB mesentery.  - 12/21 abd XRAY:  left basilar atelectasis with superimposed aerated basilar lung. No evidence of bowel obstruction. Gaseous distention of the colon  - 12/24 for CT abd/pelvis: no abscess, Free fluid in peritoneal cavity nearly completely resolved   GI Surgeries / Procedures:  - 12/6 Flexible Sigmoidoscopy: Volvulus. Successful complete decompression achieved - 12/7: sigmoid colectomy  - 12/11: exp lap with irrigation of midline incision - 12/11: NGT placed >> came out on 12/13, replaced 12/14, removed 12/22  Central access:  PICC placement  TPN start date: 12/28/21 PICC placement  Nutritional Goals: Goal TPN rate is 105 mL/hr (provides 146 g of protein and 2500 kcals per day)  RD Assessment: refeeding syndrome s/p thiamine 100 mg for 5 days; completed 12/12-12/17.  Estimated Needs Total Energy Estimated  Needs: 2400-2750 kcals Total Protein Estimated Needs: 140-170 grams Total Fluid Estimated Needs: >/= 2.1L  Current Nutrition:  12/25 adv to soft diet TPN Boost Breeze TID before meals (also on metoclopramide 5 mg PO TID ac & hs and erythromycin 250 mg IV q8h for GI prokinetic effect)  Calorie count in progress  Plan:  Continue TPN at 105 mL/hr goal - Electrolytes in TPN:  Na 70 mEq/L K 50 mEq/L  Ca 5 mEq/L  Mg 5 mEq/L Phos 10 mmol/L Cl:Ac 1:1 - Add standard MVI and trace elements to TPN - Continue moderate SSI q4h - MIVF at 20 mL/hr - Monitor TPN labs on Mon/Thurs - F/U on results of calorie count  Thank you for allowing pharmacy to be a part of this  patient's care.                            Clayburn Pert, PharmD, BCPS 01/12/2022  7:13 AM Please utilize Amion for appropriate phone number to reach the unit pharmacist ([place]ink)

## 2022-01-13 LAB — COMPREHENSIVE METABOLIC PANEL
ALT: 88 U/L — ABNORMAL HIGH (ref 0–44)
AST: 25 U/L (ref 15–41)
Albumin: 2.9 g/dL — ABNORMAL LOW (ref 3.5–5.0)
Alkaline Phosphatase: 102 U/L (ref 38–126)
Anion gap: 7 (ref 5–15)
BUN: 16 mg/dL (ref 6–20)
CO2: 22 mmol/L (ref 22–32)
Calcium: 8.6 mg/dL — ABNORMAL LOW (ref 8.9–10.3)
Chloride: 104 mmol/L (ref 98–111)
Creatinine, Ser: 0.76 mg/dL (ref 0.61–1.24)
GFR, Estimated: >60 mL/min (ref >60)
Glucose, Bld: 158 mg/dL — ABNORMAL HIGH (ref 70–99)
Potassium: 4.1 mmol/L (ref 3.5–5.1)
Sodium: 133 mmol/L — ABNORMAL LOW (ref 135–145)
Total Bilirubin: 0.7 mg/dL (ref 0.3–1.2)
Total Protein: 6.2 g/dL — ABNORMAL LOW (ref 6.5–8.1)

## 2022-01-13 LAB — GLUCOSE, CAPILLARY
Glucose-Capillary: 161 mg/dL — ABNORMAL HIGH (ref 70–99)
Glucose-Capillary: 162 mg/dL — ABNORMAL HIGH (ref 70–99)
Glucose-Capillary: 162 mg/dL — ABNORMAL HIGH (ref 70–99)
Glucose-Capillary: 172 mg/dL — ABNORMAL HIGH (ref 70–99)
Glucose-Capillary: 174 mg/dL — ABNORMAL HIGH (ref 70–99)
Glucose-Capillary: 188 mg/dL — ABNORMAL HIGH (ref 70–99)

## 2022-01-13 LAB — MAGNESIUM: Magnesium: 1.9 mg/dL (ref 1.7–2.4)

## 2022-01-13 LAB — TRIGLYCERIDES: Triglycerides: 84 mg/dL (ref <150)

## 2022-01-13 LAB — PHOSPHORUS: Phosphorus: 3.2 mg/dL (ref 2.5–4.6)

## 2022-01-13 MED ORDER — ONDANSETRON HCL 4 MG/2ML IJ SOLN
4.0000 mg | Freq: Three times a day (TID) | INTRAMUSCULAR | Status: DC | PRN
  Administered 2022-01-15: 4 mg via INTRAVENOUS
  Filled 2022-01-13: qty 2

## 2022-01-13 MED ORDER — PROCHLORPERAZINE EDISYLATE 10 MG/2ML IJ SOLN
10.0000 mg | Freq: Four times a day (QID) | INTRAMUSCULAR | Status: DC | PRN
  Administered 2022-01-13 – 2022-01-16 (×3): 10 mg via INTRAVENOUS
  Filled 2022-01-13 (×3): qty 2

## 2022-01-13 MED ORDER — TRAVASOL 10 % IV SOLN
INTRAVENOUS | Status: AC
  Filled 2022-01-13: qty 1461.6

## 2022-01-13 MED ORDER — ADULT MULTIVITAMIN W/MINERALS CH
1.0000 | ORAL_TABLET | Freq: Every day | ORAL | Status: DC
  Administered 2022-01-13 – 2022-01-21 (×9): 1 via ORAL
  Filled 2022-01-13 (×9): qty 1

## 2022-01-13 NOTE — Evaluation (Signed)
Physical Therapy Evaluation Patient Details Name: Nathaniel Hanson MRN: [id number] DOB: 21-May-1980 Today's Date: 01/13/2022  History of Present Illness  Pt admitted from home 2* syncope causes by sigmoid volvulus.  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  Of note, pt experienced episode of syncope in hospital while in shower 01/10/22  Clinical Impression  Pt admitted as above and presenting with functional mobility limitations 2* generalized weakness, limited activity tolerance, c/o dizziness with attempts to mobilize, and post op pain.  This date pt limited to OOB and transfers to chair 2* c/o dizziness and fatigue.  BP after transfer to chair 138/105 and after 2 minutes in chair 146/127 - RN aware.     Recommendations for follow up therapy are one component of a multi-disciplinary discharge planning process, led by the attending physician.  Recommendations may be updated based on patient status, additional functional criteria and insurance authorization.  Follow Up Recommendations Other (comment) (TBD)      Assistance Recommended at Discharge Intermittent Supervision/Assistance  Patient can return home with the following  A little help with walking and/or transfers;A little help with bathing/dressing/bathroom;Assistance with cooking/housework;Help with stairs or ramp for entrance;Assist for transportation    Equipment Recommendations Rolling walker (2 wheels) (dependent on acute stay progress)  Recommendations for Other Services  OT consult    Functional Status Assessment Patient has had a recent decline in their functional status and demonstrates the ability to make significant improvements in function in a reasonable and predictable amount of time.     Precautions / Restrictions Precautions Precautions: Fall Precaution Comments: abdominal binder in place Restrictions Weight Bearing Restrictions: No      Mobility  Bed Mobility Overal bed  mobility: Needs Assistance Bed Mobility: Rolling, Sidelying to Sit Rolling: Supervision Sidelying to sit: Supervision       General bed mobility comments: cues for technique and use of bedrail    Transfers Overall transfer level: Needs assistance Equipment used: Rolling walker (2 wheels) Transfers: Sit to/from Stand, Bed to chair/wheelchair/BSC Sit to Stand: Min guard   Step pivot transfers: Min assist, From elevated surface       General transfer comment: steady assist only    Ambulation/Gait               General Gait Details: step pvt bed to chair only - limited by pt c/o fatigue and dizziness  Stairs            Wheelchair Mobility    Modified Rankin (Stroke Patients Only)       Balance Overall balance assessment: Needs assistance Sitting-balance support: No upper extremity supported, Feet supported Sitting balance-Leahy Scale: Good     Standing balance support: No upper extremity supported Standing balance-Leahy Scale: Fair                               Pertinent Vitals/Pain Pain Assessment Pain Assessment: 0-10 Pain Score: 4  Pain Location: abdomen Pain Descriptors / Indicators: Sore Pain Intervention(s): Limited activity within patient's tolerance, Monitored during session, Premedicated before session    Home Living Family/patient expects to be discharged to:: Private residence Living Arrangements: Other relatives Available Help at Discharge: Family;Available PRN/intermittently Type of Home: House Home Access: Level entry     Alternate Level Stairs-Number of Steps: flight Home Layout: Two level Home Equipment: None      Prior Function Prior Level of Function : Independent/Modified Independent  Mobility Comments: Pt works as Contractor        Extremity/Trunk Assessment   Upper Extremity Assessment Upper Extremity Assessment: Generalized weakness    Lower Extremity  Assessment Lower Extremity Assessment: Generalized weakness    Cervical / Trunk Assessment Cervical / Trunk Assessment: Normal  Communication   Communication: No difficulties  Cognition Arousal/Alertness: Awake/alert Behavior During Therapy: WFL for tasks assessed/performed Overall Cognitive Status: Within Functional Limits for tasks assessed                                          General Comments      Exercises     Assessment/Plan    PT Assessment Patient needs continued PT services  PT Problem List Decreased strength;Decreased activity tolerance;Decreased balance;Decreased mobility;Decreased knowledge of use of DME;Pain       PT Treatment Interventions DME instruction;Gait training;Stair training;Functional mobility training;Therapeutic activities;Therapeutic exercise;Patient/family education    PT Goals (Current goals can be found in the Care Plan section)  Acute Rehab PT Goals Patient Stated Goal: Regain IND PT Goal Formulation: With patient Time For Goal Achievement: 01/27/22 Potential to Achieve Goals: Good    Frequency Min 3X/week     Co-evaluation               AM-PAC PT "6 Clicks" Mobility  Outcome Measure Help needed turning from your back to your side while in a flat bed without using bedrails?: A Little Help needed moving from lying on your back to sitting on the side of a flat bed without using bedrails?: A Little Help needed moving to and from a bed to a chair (including a wheelchair)?: A Little Help needed standing up from a chair using your arms (e.g., wheelchair or bedside chair)?: A Little Help needed to walk in hospital room?: Total Help needed climbing 3-5 steps with a railing? : Total 6 Click Score: 14    End of Session Equipment Utilized During Treatment: Gait belt Activity Tolerance: Patient limited by fatigue;Other (comment) (dizziness) Patient left: in chair;with call bell/phone within reach;with chair alarm  set Nurse Communication: Mobility status PT Visit Diagnosis: Unsteadiness on feet (R26.81)    Time: 7989-2119 PT Time Calculation (min) (ACUTE ONLY): 21 min   Charges:   PT Evaluation $PT Eval Low Complexity: 1 Low          Mauro Kaufmann PT Acute Rehabilitation Services Pager [phone] Office [phone]   Kirin Pastorino 01/13/2022, 1:21 PM

## 2022-01-13 NOTE — Progress Notes (Signed)
Calorie Count Note  48 hour calorie count ordered.  Diet: Soft Supplements: Prosource Plus TID; Magic Cup TID   Day 1 (12/26): *Nothing documented on calorie count sheet Breakfast: no breakfast tray ordered Lunch: 5% of mashed potatoes = 7 kcals, 0g protein Dinner: no dinner tray ordered Supplements: 1 Boost Breeze = 250 kcals, 9g protein Total intake: 257 kcal (11% of minimum estimated needs)  9 protein (6% of minimum estimated needs)   Day 2 (12/27): *Calorie count sheet did not transfer with patient to new room *Patient reports he did not eat any food yesterday, only drank some gatorade Supplements: Prosource Plus TID = 300 kcals and 45g protein Total intake:  *all from supplements 300 kcal (13% of minimum estimated needs)  45 protein (32% of minimum estimated needs)   Subjective: Patient reports eating nothing yesterday. Drank some gatorade. Feeling nauseous and experiencing continued distension today. Does not like the [employer]. Has been taking the Prosource Plus. Agreeable to try another supplement.  Suspect patient may need more time before being able to take in enough oral nutrition to come off TPN.  Per discussion with MD, will complete calorie count and allow patient a few more days to start increasing oral intake. Will likely run another calorie count in the near future once patient taking in more PO.   Nutrition Dx: Inadequate oral intake related to altered GI function (ileus) as evidenced by NPO status, other (comment) (on TPN).    Goal: Patient will meet greater than or equal to 90% of their needs - orally.   Intervention:  - Continue Prosource Plus TID, each supplement provides 100 kcal and 15g protein. - Mighty Shake TID with meals, each supplement provides 330 kcals and 9 grams of protein - Encourage intake as tolerated.   - Suspect patient will need more a few more days to feel better and eat more.  - Can run another calorie count at that time.  -  Continue goal TPN at 122mL/hr at this time.  - TPN management per Pharmacy   Shelle Iron RD, LDN For contact information, refer to [facility].

## 2022-01-13 NOTE — Progress Notes (Signed)
PHARMACY - TOTAL PARENTERAL NUTRITION CONSULT NOTE   Indication: Prolonged ileus  Patient Measurements: Height: _0  (185.4 cm) Weight: 110.2 kg (242 lb 15.2 oz) IBW/kg (Calculated) : 79.9 TPN AdjBW (KG): 88.9 Body mass index is 32.05 kg/m. Usual Weight:   Assessment: Patient is a 41 y.o M who presented to the ED on 12/21/21 for evaluation after he experienced syncope and blacked out at home.  Abdominal CT on 12/21/21 showed findings consistent with sigmoid volvulus.He underwent sigmoidoscopy on 12/22/21 with decompression of the volvulus. However, he still had significant sigmoid colon dilation s/p procedure and subsequently underwent sigmoid colectomy on 12/23/21. On 12/11, he had bleeding from surgical incision site as well as n/v and was found to have "fascial dehiscence and near evisceration of his bowel."  He was taken to the OR emergently on 12/27/21 for  exp lap with irrigation of the midline incision.  Abd x-ray on 12/27/21 showed findings consistent with post-op ileus. Fascial dehiscence. Pharmacy has been consulted to start TPN.  Recent Labs    01/11/22 0322 01/12/22 0356 01/13/22 0230  NA 134*  --  133*  K 4.0  --  4.1  CL 105  --  104  CO2 23  --  22  GLUCOSE 147*  --  158*  BUN 13  --  16  CREATININE 0.65  --  0.76  CALCIUM 8.4*  --  8.6*  PHOS 2.3* 3.4 3.2  MG 1.9  --  1.9  ALBUMIN  --   --  2.9*  ALKPHOS  --   --  102  AST  --   --  25  ALT  --   --  88*  BILITOT  --   --  0.7  TRIG  --   --  84    Glucose / Insulin: no hx DM - on mSSI q4h (18 units insulin given in the past 24 hrs) - goal cbgs <150: 155-192 over previous 24hrs Electrolytes: Na low.  Phos remains wnl today after supplementation  Renal: Scr wnl, BUN WNL. UOP adequate Hepatic:  - AST, Tbili and Alk Phos WNL, ALT sl high - albumin 2.9 - TG 84 Intake / Output; MIVF: NS at 20 ml/hr  - I/O: incomplete (+2.1 L) - UOP: 1300 ml charted (0.5 ml/kg/hr) - BM x2 on 12/27 GI Imaging: - 12/5 abd CT:  Findings compatible with sigmoid volvulus. - 12/11 abd xray: Diffuse gaseous distention of bowel, compatible with postoperative ileus. - 12/14 abd xray: Persistent diffuse gaseous distension of bowel, compatible with ileus, with increased small bowel distension in comparison to prior. - 12/18 CT: fluid collection in pelvis not definitively abscess. Pericolonic stranding of the splenic flexure and SB mesentery.  - 12/21 abd XRAY:  left basilar atelectasis with superimposed aerated basilar lung. No evidence of bowel obstruction. Gaseous distention of the colon  - 12/24 for CT abd/pelvis: no abscess, Free fluid in peritoneal cavity nearly completely resolved   GI Surgeries / Procedures:  - 12/6 Flexible Sigmoidoscopy: Volvulus. Successful complete decompression achieved - 12/7: sigmoid colectomy  - 12/11: exp lap with irrigation of midline incision - 12/11: NGT placed >> came out on 12/13, replaced 12/14, removed 12/22  Central access:  PICC placement  TPN start date: 12/28/21 PICC placement  Nutritional Goals: Goal TPN rate is 105 mL/hr (provides 146 g of protein and 2500 kcals per day)  RD Assessment: Refeeding syndrome s/p thiamine 100 mg for 5 days; completed 12/12-12/17. Estimated Needs Total Energy Estimated Needs:  2400-2750 kcals Total Protein Estimated Needs: 140-170 grams Total Fluid Estimated Needs: >/= 2.1L  Current Nutrition:  Soft diet (started 12/25) Prosource plus liquid 30m TID  12/27 Calorie count:  257 kcal, 9g protein TPN Medications:  metoclopramide QID, erythromycin IV for GI prokinetic effect, Miralax BID,   Plan:  Continue TPN at 105 mL/hr goal - Electrolytes in TPN:  Na 100 mEq/L  K 50 mEq/L  Ca 5 mEq/L  Mg 5 mEq/L Phos 10 mmol/L Cl:Ac 1:1 - Continue moderate SSI q4h - MIVF at 20 mL/hr - Monitor TPN labs on Mon/Thurs - Change to Oral MVI  - F/U on results of oral intake.    Thank you for allowing pharmacy to be a part of this patient's  care.  CGretta ArabPharmD, BCPS [facility] main pharmacy [phone]/28/2023 7:55 AM

## 2022-01-13 NOTE — Progress Notes (Signed)
17 Days Post-Op   Subjective/Chief Complaint: Had 2 bowel movements yesterday after suppository - one he did not realize until afterwards and one after suppository which was small. He did not feel much relief afterwards - still nauseas and distended. Nausea is intermittently all the time but worse with walking and any po intake. Still not eating much  Objective: Vital signs in last 24 hours: Temp:  [98.5 F (36.9 C)-99 F (37.2 C)] 99 F (37.2 C) (12/28 0071) Pulse Rate:  [98-124] 98 (12/28 0626) Resp:  [16-18] 16 (12/28 0626) BP: (103-150)/(84-99) 115/90 (12/28 0608) SpO2:  [95 %-98 %] 98 % (12/28 0626) Last BM Date : 01/12/22  Intake/Output from previous day: 12/27 0701 - 12/28 0700 In: 3427.9 [P.O.:240; I.V.:2886.7; IV Piggyback:301.2] Out: 1300 [Urine:1300] Intake/Output this shift: No intake/output data recorded.  Exam: Awake and alert Abdomen distended, non-tender, wound clean and shallow - bleeding some this am with dressing change  Lab Results:  No results for input(s): "WBC", "HGB", "HCT", "PLT" in the last 72 hours.  BMET Recent Labs    01/11/22 0322 01/13/22 0230  NA 134* 133*  K 4.0 4.1  CL 105 104  CO2 23 22  GLUCOSE 147* 158*  BUN 13 16  CREATININE 0.65 0.76  CALCIUM 8.4* 8.6*    PT/INR No results for input(s): "LABPROT", "INR" in the last 72 hours. ABG No results for input(s): "PHART", "HCO3" in the last 72 hours.  Invalid input(s): "PCO2", "PO2"  Studies/Results: No results found.  Anti-infectives: Anti-infectives (From admission, onward)    Start     Dose/Rate Route Frequency Ordered Stop   01/11/22 1400  erythromycin 250 mg in sodium chloride 0.9 % 100 mL IVPB        250 mg 100 mL/hr over 60 Minutes Intravenous Every 8 hours 01/11/22 1058     12/27/21 2130  ceFAZolin (ANCEF) IVPB 2g/100 mL premix        2 g 200 mL/hr over 30 Minutes Intravenous  Once 12/27/21 2039 12/27/21 2138   12/23/21 1400  cefoTEtan (CEFOTAN) 2 g in sodium  chloride 0.9 % 100 mL IVPB        2 g 200 mL/hr over 30 Minutes Intravenous 60 min pre-op 12/23/21 1040 12/23/21 1444       Assessment/Plan: Sigmoid volvulus on admission imaging - operative findings of sigmoid obstruction due to adhesions POD 21 s/p sigmoid colectomy, mobilization of splenic flexure Dr. Dossie Der 12/7 POD 17 s/p exploratory laparotomy placement of retention sutures for fascial dehiscence Dr. 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 On scheduled erythromycin, reglan - add zofran for refractory nausea - afebrile, labs stable - CT 12/18 with fluid collection in pelvis not definitively abscess. Pericolonic stranding of the splenic flexure and SB mesentery.  Repeat CT 12/24 without significant fluid collection/mostly resolved; persistent distention of colon c/w adynamic ileus, no obstruction  Continue TNA, ambulate Continue reglan, suppos, erythromycin Wound care  FEN: TPN ID: none currently VTE: lovenox  Eric Form, [facility] 01/13/2022, 8:24 AM Please see Amion for pager number during day hours 7:00am-4:30pm

## 2022-01-13 NOTE — Progress Notes (Signed)
  Progress Note   Patient: Nathaniel Hanson VVO:[id number] DOB: Sep 05, 1980 DOA: 12/21/2021     22 DOS: the patient was seen and examined on 01/13/2022   Brief hospital course: 41 year old male who presented to the hospital after sigmoid volvulus which caused syncope. He underwent a flex sigmoidoscopy on 12/6 without success. On 12/7, the patient underwent a sigmoid colectomy and mobilization of the splenic flexure.  His postop course has been complicated by a persistent ileus.  He was noted to have fascial dehiscence and return to the OR on 12/11 for placement of retention sutures.  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 Dietitian following, continuing with calorie count. Continue TPN for now    Active Problems: Mildly elevated LFTs  -he has no prior history of elevation - improved, total bili normal   Glucose intolerance - A1c is 5.7 - Cont diet/lifestyle modification     Syncope -Cause unclear but suspected to be vasovagal vs deconditioning from prolonged laying in bed this visit -Will consult PT   Hyponatremia, sinus tachycardia and dehydration - Resolved   Obesity Body mass index is 32.05 kg/m.      Subjective: Complaining of continued nausea not improved with zofran or phenergan  Physical Exam: Vitals:   01/13/22 0050 01/13/22 0243 01/13/22 0608 01/13/22 0626  BP:  117/87 (!) 115/90   Pulse: (!) 110 (!) 110 (!) 118 98  Resp: 16 17 18 16   Temp:  98.9 F (37.2 C) 99 F (37.2 C)   TempSrc:  Oral Oral   SpO2: 97% 95% 97% 98%  Weight:      Height:       General exam: Conversant, in no acute distress Respiratory system: normal chest rise, clear, no audible wheezing Cardiovascular system: regular rhythm, s1-s2 Gastrointestinal system: Nondistended, nontender, pos BS Central nervous system: No seizures, no  tremors Extremities: No cyanosis, no joint deformities Skin: No rashes, no pallor Psychiatry: Affect normal // no auditory hallucinations   Data Reviewed:  Labs reviewed: Na 133, K 4.1, Cr 0.76, WBC 9.7, Hgb 12.0  Family Communication: Pt in room, family not at bedside  Disposition: Status is: Inpatient Remains inpatient appropriate because: Severity of illness  Planned Discharge Destination: Home    Author: , MD 01/13/2022 1:47 PM  For on call review [email or web address].

## 2022-01-14 ENCOUNTER — Inpatient Hospital Stay (HOSPITAL_COMMUNITY): Payer: Self-pay

## 2022-01-14 LAB — CBC
HCT: 38.2 % — ABNORMAL LOW (ref 39.0–52.0)
Hemoglobin: 12.5 g/dL — ABNORMAL LOW (ref 13.0–17.0)
MCH: 28.2 pg (ref 26.0–34.0)
MCHC: 32.7 g/dL (ref 30.0–36.0)
MCV: 86.2 fL (ref 80.0–100.0)
Platelets: 312 10*3/uL (ref 150–400)
RBC: 4.43 MIL/uL (ref 4.22–5.81)
RDW: 14.1 % (ref 11.5–15.5)
WBC: 9.9 10*3/uL (ref 4.0–10.5)
nRBC: 0 % (ref 0.0–0.2)

## 2022-01-14 LAB — GLUCOSE, CAPILLARY
Glucose-Capillary: 127 mg/dL — ABNORMAL HIGH (ref 70–99)
Glucose-Capillary: 135 mg/dL — ABNORMAL HIGH (ref 70–99)
Glucose-Capillary: 140 mg/dL — ABNORMAL HIGH (ref 70–99)
Glucose-Capillary: 141 mg/dL — ABNORMAL HIGH (ref 70–99)
Glucose-Capillary: 145 mg/dL — ABNORMAL HIGH (ref 70–99)
Glucose-Capillary: 157 mg/dL — ABNORMAL HIGH (ref 70–99)
Glucose-Capillary: 97 mg/dL (ref 70–99)

## 2022-01-14 MED ORDER — IOHEXOL 9 MG/ML PO SOLN
500.0000 mL | ORAL | Status: AC
  Administered 2022-01-14 (×2): 500 mL via ORAL

## 2022-01-14 MED ORDER — IOHEXOL 300 MG/ML  SOLN
100.0000 mL | Freq: Once | INTRAMUSCULAR | Status: AC | PRN
  Administered 2022-01-14: 100 mL

## 2022-01-14 MED ORDER — TRAVASOL 10 % IV SOLN
INTRAVENOUS | Status: AC
  Filled 2022-01-14: qty 1461.6

## 2022-01-14 MED ORDER — IOHEXOL 300 MG/ML  SOLN
100.0000 mL | Freq: Once | INTRAMUSCULAR | Status: AC | PRN
  Administered 2022-01-14: 100 mL via INTRAVENOUS

## 2022-01-14 MED ORDER — IOHEXOL 9 MG/ML PO SOLN
ORAL | Status: AC
  Filled 2022-01-14: qty 1000

## 2022-01-14 NOTE — Progress Notes (Signed)
  Progress Note   Patient: Nathaniel Hanson ELF:[id number] DOB: 01-27-1980 DOA: 12/21/2021     23 DOS: the patient was seen and examined on 01/14/2022   Brief hospital course: 41 year old male who presented to the hospital after sigmoid volvulus which caused syncope. He underwent a flex sigmoidoscopy on 12/6 without success. On 12/7, the patient underwent a sigmoid colectomy and mobilization of the splenic flexure.  His postop course has been complicated by a persistent ileus.  He was noted to have fascial dehiscence and return to the OR on 12/11 for placement of retention sutures.  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 Dietitian following, continuing with calorie count. Continue TPN for now -Per General Surgery, recs for rectal CT with contrast. CT reviewed, finding of ileus. F/u with General Surgery recs    Active Problems: Mildly elevated LFTs  -he has no prior history of elevation - improved, total bili normal   Glucose intolerance - A1c is 5.7 - Cont diet/lifestyle modification     Syncope -Cause unclear but suspected to be vasovagal vs deconditioning from prolonged laying in bed this visit -PT consulted   Hyponatremia, sinus tachycardia and dehydration -stable  Obesity Body mass index is 32.05 kg/m.      Subjective: Reports better tolerating diet today  Physical Exam: Vitals:   01/14/22 0603 01/14/22 0953 01/14/22 1401 01/14/22 1434  BP: (!) 120/93 (!) 142/85 (!) 144/93   Pulse: (!) 110 (!) 102 (!) 122 (!) 117  Resp: 18 14 14    Temp: 98 F (36.7 C) 98 F (36.7 C) 98.5 F (36.9 C)   TempSrc: Oral Oral Oral   SpO2: 99% 100% 98% 98%  Weight:      Height:       General exam: Conversant, in no acute distress Respiratory system: normal chest rise, clear, no audible wheezing Cardiovascular system:  regular rhythm, s1-s2 Gastrointestinal system: abd binder in place Central nervous system: No seizures, no tremors Extremities: No cyanosis, no joint deformities Skin: No rashes, no pallor Psychiatry: Affect normal // no auditory hallucinations   Data Reviewed:  There are no new results to review at this time.  Family Communication: Pt in room, family not at bedside  Disposition: Status is: Inpatient Remains inpatient appropriate because: Severity of illness  Planned Discharge Destination: Home    Author: , MD 01/14/2022 2:41 PM  For on call review [email or web address].

## 2022-01-14 NOTE — Progress Notes (Signed)
Physical Therapy Treatment Patient Details Name: Nathaniel Hanson MRN: [id number] DOB: December 08, 1980 Today's Date: 01/14/2022   History of Present Illness Pt admitted from home 2* syncope causes by sigmoid volvulus.  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  Of note, pt experienced episode of syncope in hospital while in shower 01/10/22    PT Comments    Pt is OOB in recliner watch sports.  AxO x 3 [facility] [employer] and [employer] Assisted with amb.  General Gait Details: slow but staedy gait while monitoring HR.  Resting HR 122.  Highest HR during gait was 134.  Pt asymptomatic.  Slow gait, pt stated "I don't want to pass out again".   Pt tolerated amb 175 feet with recliner following as a precaution. Pt plans to D/C back home when medically stable.   Recommendations for follow up therapy are one component of a multi-disciplinary discharge planning process, led by the attending physician.  Recommendations may be updated based on patient status, additional functional criteria and insurance authorization.  Follow Up Recommendations   (TBD closer to D/C date)     Assistance Recommended at Discharge Intermittent Supervision/Assistance  Patient can return home with the following A little help with walking and/or transfers;A little help with bathing/dressing/bathroom;Assistance with cooking/housework;Help with stairs or ramp for entrance;Assist for transportation   Equipment Recommendations  Rolling walker (2 wheels) (TBD closer to D/C date)    Recommendations for Other Services       Precautions / Restrictions Precautions Precautions: Fall Precaution Comments: abdominal binder in place Restrictions Weight Bearing Restrictions: No     Mobility  Bed Mobility               General bed mobility comments: OOB in recliner    Transfers Overall transfer level: Needs assistance Equipment used: Rolling  walker (2 wheels) Transfers: Sit to/from Stand, Bed to chair/wheelchair/BSC Sit to Stand: Supervision, Min guard           General transfer comment: good use of B UE's to steady self    Ambulation/Gait Ambulation/Gait assistance: Supervision, Min guard Gait Distance (Feet): 175 Feet Assistive device: Rolling walker (2 wheels) Gait Pattern/deviations: Step-through pattern Gait velocity: decreased     General Gait Details: slow but staedy gait while monitoring HR.  Resting HR 122.  Highest HR during gait was 134.  Pt tolerated amb 175 feet with recliner following as a precaution.   Stairs             Wheelchair Mobility    Modified Rankin (Stroke Patients Only)       Balance                                            Cognition Arousal/Alertness: Awake/alert Behavior During Therapy: WFL for tasks assessed/performed Overall Cognitive Status: Within Functional Limits for tasks assessed                                 General Comments: AxO x 3 [facility] [employer] and [employer]        Exercises      General Comments        Pertinent Vitals/Pain Pain Assessment Pain Assessment: Faces Faces Pain Scale: Hurts  a little bit Pain Location: abdomen Pain Descriptors / Indicators: Sore Pain Intervention(s): Monitored during session    Home Living                          Prior Function            PT Goals (current goals can now be found in the care plan section) Progress towards PT goals: Progressing toward goals    Frequency    Min 3X/week      PT Plan Current plan remains appropriate    Co-evaluation              AM-PAC PT "6 Clicks" Mobility   Outcome Measure  Help needed turning from your back to your side while in a flat bed without using bedrails?: A Little Help needed moving from lying on your back to sitting on the side of a flat bed  without using bedrails?: A Little Help needed moving to and from a bed to a chair (including a wheelchair)?: A Little Help needed standing up from a chair using your arms (e.g., wheelchair or bedside chair)?: A Little Help needed to walk in hospital room?: A Little Help needed climbing 3-5 steps with a railing? : A Lot 6 Click Score: 17    End of Session Equipment Utilized During Treatment: Gait belt Activity Tolerance: Patient tolerated treatment well Patient left: in chair;with call bell/phone within reach;with chair alarm set Nurse Communication: Mobility status PT Visit Diagnosis: Unsteadiness on feet (R26.81)     Time: 7591-6384 PT Time Calculation (min) (ACUTE ONLY): 12 min  Charges:  $Gait Training: 8-22 mins                     Felecia Shelling  PTA Acute  Rehabilitation Services Office M-F          [phone] Weekend pager [phone]

## 2022-01-14 NOTE — Progress Notes (Addendum)
Pt markedly improved today vs. yesterday's assessment. Spent most of the day in the chair and ate half of lunch without experiencing any nausea. No PRN nausea meds given today. Mood and affect also vastly improved from yesterday. Pt interactive during care and smiling.

## 2022-01-14 NOTE — Progress Notes (Signed)
PHARMACY - TOTAL PARENTERAL NUTRITION CONSULT NOTE   Indication: Prolonged ileus  Patient Measurements: Height: 6' 1" (185.4 cm) Weight: 110.2 kg (242 lb 15.2 oz) IBW/kg (Calculated) : 79.9 TPN AdjBW (KG): 88.9 Body mass index is 32.05 kg/m.  Assessment: Patient is a 41 y.o M who presented to the ED on 12/21/21 for evaluation after he experienced syncope and blacked out at home.  Abdominal CT on 12/21/21 showed findings consistent with sigmoid volvulus.He underwent sigmoidoscopy on 12/22/21 with decompression of the volvulus. However, he still had significant sigmoid colon dilation s/p procedure and subsequently underwent sigmoid colectomy on 12/23/21. On 12/11, he had bleeding from surgical incision site as well as n/v and was found to have "fascial dehiscence and near evisceration of his bowel."  He was taken to the OR emergently on 12/27/21 for  exp lap with irrigation of the midline incision.  Abd x-ray on 12/27/21 showed findings consistent with post-op ileus. Fascial dehiscence. Pharmacy has been consulted to start TPN.  Recent Labs    01/12/22 0356 01/13/22 0230  NA  --  133*  K  --  4.1  CL  --  104  CO2  --  22  GLUCOSE  --  158*  BUN  --  16  CREATININE  --  0.76  CALCIUM  --  8.6*  PHOS 3.4 3.2  MG  --  1.9  ALBUMIN  --  2.9*  ALKPHOS  --  102  AST  --  25  ALT  --  88*  BILITOT  --  0.7  TRIG  --  84    Glucose / Insulin: no hx DM - on mSSI q4h (14 units insulin given in the past 24 hrs) - goal cbgs <150: 97-188 over previous 24hrs Electrolytes: Last labs on 12/28 - WNL except Na low. Renal: Scr wnl, BUN WNL. UOP decreasing Hepatic:  - AST, Tbili and Alk Phos WNL, ALT sl high - albumin 2.9 - TG 84 Intake / Output; MIVF: NS at 20 ml/hr  - I/O: incomplete (+2.5 L) - UOP: 1000 ml charted (0.4 ml/kg/hr) - No BM charted on 12/28 GI Imaging: - 12/5 abd CT: Findings compatible with sigmoid volvulus. - 12/11 abd xray: Diffuse gaseous distention of bowel, compatible  with postoperative ileus. - 12/14 abd xray: Persistent diffuse gaseous distension of bowel, compatible with ileus, with increased small bowel distension in comparison to prior. - 12/18 CT: fluid collection in pelvis not definitively abscess. Pericolonic stranding of the splenic flexure and SB mesentery.  - 12/21 abd XRAY:  left basilar atelectasis with superimposed aerated basilar lung. No evidence of bowel obstruction. Gaseous distention of the colon  - 12/24 for CT abd/pelvis: no abscess, Free fluid in peritoneal cavity nearly completely resolved   GI Surgeries / Procedures:  - 12/6 Flexible Sigmoidoscopy: Volvulus. Successful complete decompression achieved - 12/7: sigmoid colectomy  - 12/11: exp lap with irrigation of midline incision - 12/11: NGT placed >> came out on 12/13, replaced 12/14, removed 12/22  Central access:  PICC placement  TPN start date: 12/28/21 PICC placement  Nutritional Goals: Goal TPN rate is 105 mL/hr (provides 146 g of protein and 2500 kcals per day)  RD Assessment: Refeeding syndrome s/p thiamine 100 mg for 5 days; completed 12/12-12/17. Estimated Needs Total Energy Estimated Needs: 2400-2750 kcals Total Protein Estimated Needs: 140-170 grams Total Fluid Estimated Needs: >/= 2.1L  Current Nutrition:  Soft diet (started 12/25); Nutritional supplements per RD 12/26-12/27 Calorie count shows minimal intake.    Continue full rate TPN Medications:  metoclopramide QID, erythromycin IV for GI prokinetic effect, Miralax BID  Plan:  Continue TPN at 105 mL/hr goal - Electrolytes in TPN:  Na 100 mEq/L  K 50 mEq/L  Ca 5 mEq/L  Mg 5 mEq/L Phos 10 mmol/L Cl:Ac 1:1 - Continue moderate SSI q4h - MIVF at 20 mL/hr - Monitor TPN labs on Mon/Thurs; BMET on Saturday - Continue oral MVI  - F/U oral intake.    Thank you for allowing pharmacy to be a part of this patient's care.  Christine Shade PharmD, BCPS [facility] main pharmacy [phone] 01/14/2022 7:14 AM  

## 2022-01-14 NOTE — Progress Notes (Signed)
18 Days Post-Op   Subjective/Chief Complaint: Refused suppository yesterday as the day prior he'd had a lot of discomfort with it and felt like he had to sit on the bedside commode for over an hour. Did have a small BM yesterday and passing small amount of flatus. Still low appetite and nausea but nausea improved yesterday. Bloating is stable.  Acute onset low back pain is most bothersome symptom this am. Since around 3 am he has and ongoing bilateral low back pain which has been minimally improved with heat. He has not ambulated yet this am  Objective: Vital signs in last 24 hours: Temp:  [97.4 F (36.3 C)-99.6 F (37.6 C)] 98 F (36.7 C) (12/29 0603) Pulse Rate:  [110-124] 110 (12/29 0603) Resp:  [16-18] 18 (12/29 0603) BP: (112-135)/(80-93) 120/93 (12/29 0603) SpO2:  [95 %-99 %] 99 % (12/29 0603) Last BM Date : 01/13/22  Intake/Output from previous day: 12/28 0701 - 12/29 0700 In: 3540.7 [P.O.:770; I.V.:2371.9; IV Piggyback:398.8] Out: 1001 [Urine:1000; Stool:1] Intake/Output this shift: No intake/output data recorded.  Exam: Awake and alert Abdomen distended, non-tender, wound clean and shallow - bleeding some this am with dressing change    Lab Results:  No results for input(s): "WBC", "HGB", "HCT", "PLT" in the last 72 hours.  BMET Recent Labs    01/13/22 0230  NA 133*  K 4.1  CL 104  CO2 22  GLUCOSE 158*  BUN 16  CREATININE 0.76  CALCIUM 8.6*    PT/INR No results for input(s): "LABPROT", "INR" in the last 72 hours. ABG No results for input(s): "PHART", "HCO3" in the last 72 hours.  Invalid input(s): "PCO2", "PO2"  Studies/Results: No results found.  Anti-infectives: Anti-infectives (From admission, onward)    Start     Dose/Rate Route Frequency Ordered Stop   01/11/22 1400  erythromycin 250 mg in sodium chloride 0.9 % 100 mL IVPB        250 mg 100 mL/hr over 60 Minutes Intravenous Every 8 hours 01/11/22 1058     12/27/21 2130  ceFAZolin (ANCEF)  IVPB 2g/100 mL premix        2 g 200 mL/hr over 30 Minutes Intravenous  Once 12/27/21 2039 12/27/21 2138   12/23/21 1400  cefoTEtan (CEFOTAN) 2 g in sodium chloride 0.9 % 100 mL IVPB        2 g 200 mL/hr over 30 Minutes Intravenous 60 min pre-op 12/23/21 1040 12/23/21 1444       Assessment/Plan: Sigmoid volvulus on admission imaging - operative findings of sigmoid obstruction due to adhesions POD 22 s/p sigmoid colectomy, mobilization of splenic flexure Dr. Dossie Der 12/7 POD 18 s/p exploratory laparotomy placement of retention sutures for fascial dehiscence Dr. 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 On scheduled erythromycin, reglan. Refused suppos 12/28 - added zofran for refractory nausea - afebrile, labs stable - repeat CBC pending this am - CT 12/18 with fluid collection in pelvis not definitively abscess. Pericolonic stranding of the splenic flexure and SB mesentery.  Repeat CT 12/24 without significant fluid collection/mostly resolved; persistent distention of colon c/w adynamic ileus, no obstruction - Continue TNA, ambulate - Wound care  Await CBC and consider repeat CT with rectal contrast soon  FEN: TPN ID: none currently VTE: lovenox  Eric Form, [facility] 01/14/2022, 8:05 AM Please see Amion for pager number during day  hours 7:00am-4:30pm

## 2022-01-15 LAB — GLUCOSE, CAPILLARY
Glucose-Capillary: 136 mg/dL — ABNORMAL HIGH (ref 70–99)
Glucose-Capillary: 128 mg/dL — ABNORMAL HIGH (ref 70–99)
Glucose-Capillary: 120 mg/dL — ABNORMAL HIGH (ref 70–99)
Glucose-Capillary: 132 mg/dL — ABNORMAL HIGH (ref 70–99)
Glucose-Capillary: 130 mg/dL — ABNORMAL HIGH (ref 70–99)

## 2022-01-15 LAB — BASIC METABOLIC PANEL
Anion gap: 3 — ABNORMAL LOW (ref 5–15)
BUN: 16 mg/dL (ref 6–20)
CO2: 27 mmol/L (ref 22–32)
Calcium: 8.5 mg/dL — ABNORMAL LOW (ref 8.9–10.3)
Chloride: 104 mmol/L (ref 98–111)
Creatinine, Ser: 0.75 mg/dL (ref 0.61–1.24)
GFR, Estimated: >60 mL/min (ref >60)
Glucose, Bld: 126 mg/dL — ABNORMAL HIGH (ref 70–99)
Potassium: 4 mmol/L (ref 3.5–5.1)
Sodium: 134 mmol/L — ABNORMAL LOW (ref 135–145)

## 2022-01-15 MED ORDER — TRAVASOL 10 % IV SOLN
INTRAVENOUS | Status: AC
  Filled 2022-01-15: qty 765.6

## 2022-01-15 MED ORDER — TRAMADOL HCL 50 MG PO TABS
50.0000 mg | ORAL_TABLET | Freq: Four times a day (QID) | ORAL | Status: DC | PRN
  Administered 2022-01-15: 50 mg via ORAL
  Filled 2022-01-15: qty 1

## 2022-01-15 MED ORDER — ORAL CARE MOUTH RINSE: 15.0000 mL | OROMUCOSAL | Status: DC | PRN

## 2022-01-15 MED ORDER — SODIUM CHLORIDE 0.9 % IV SOLN: INTRAVENOUS | Status: DC | PRN

## 2022-01-15 MED ORDER — SORBITOL 70 % SOLN
200.0000 mL | TOPICAL_OIL | Freq: Once | ORAL | Status: DC
  Filled 2022-01-15 (×2): qty 60

## 2022-01-15 MED ORDER — METHOCARBAMOL 500 MG PO TABS: 500.0000 mg | ORAL_TABLET | Freq: Three times a day (TID) | ORAL | Status: DC | PRN

## 2022-01-15 NOTE — Evaluation (Signed)
Occupational Therapy Evaluation Patient Details Name: Nathaniel Hanson MRN: [id number] DOB: [birth year] Today's Date: 01/15/2022   History of Present Illness Pt admitted from home 2* syncope causes by sigmoid volvulus.  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 On evaluation patient is setup and seated position for most ADLs and min guard for standing. He is fearful of falling after incident in shower on 12/25 and reports pain in back the last couple of days. With standing he reported his back "locked up" and returned to seated position. Therapist encouraged movement despite back pain and patient marched in place x 3 minutes in front of chair. Therapist encouraged ambulation with nursing and mobility tech. From an OT standpoint he needs to work on activity tolerance and performing ADLs out of seated position. Patient will benefit from skilled OT services while in hospital to improve deficits and learn compensatory strategies as needed in order to return to PLOF.   Of note, patient reported to this therapist that he will be discharging to an apartment on [address] and doesn't really have any family or friends to help except for school staff. He mentioned that the school nurse could maybe stop by. He will be able to eat in the mess hall on campus. This is different from what he reported to PT on evaluation.      Recommendations for follow up therapy are one component of a multi-disciplinary discharge planning process, led by the attending physician.  Recommendations may be updated based on patient status, additional functional criteria and insurance authorization.   Follow Up Recommendations  No OT follow up     Assistance Recommended at  Discharge PRN  Patient can return home with the following Assistance with cooking/housework    Functional Status Assessment  Patient has had a recent decline in their functional status and demonstrates the ability to make significant improvements in function in a reasonable and predictable amount of time.  Equipment Recommendations  None recommended by OT    Recommendations for Other Services       Precautions / Restrictions Precautions Precautions: Fall Precaution Comments: abdominal binder in place Restrictions Weight Bearing Restrictions: No      Mobility Bed Mobility               General bed mobility comments: OOB in recliner    Transfers Overall transfer level: Needs assistance Equipment used: Rolling walker (2 wheels) Transfers: Sit to/from Stand Sit to Stand: Min guard                  Balance Overall balance assessment: Mild deficits observed, not formally tested                                         ADL either performed or assessed with clinical judgement   ADL Overall ADL's : Needs assistance/impaired Eating/Feeding: Independent   Grooming: Set up;Sitting   Upper Body Bathing: Set up;Sitting   Lower Body Bathing: Set up;Sitting/lateral leans   Upper Body Dressing : Set up;Sitting   Lower Body Dressing: Set up;Sit to/from stand   Toilet Transfer: Min guard;BSC/3in1  Toileting- Clothing Manipulation and Hygiene: Sitting/lateral lean;Supervision/safety Toileting - Clothing Manipulation Details (indicate cue type and reason): reports independence with pericare     Functional mobility during ADLs: Min guard;Rolling walker (2 wheels)       Vision   Vision Assessment?: No apparent visual deficits     Perception     Praxis      Pertinent Vitals/Pain Pain Assessment Pain Assessment: Faces Faces Pain Scale: Hurts even more Pain Location: back "locking up" per patient report Pain Descriptors / Indicators:  Sore, Grimacing Pain Intervention(s): Monitored during session     Hand Dominance Right   Extremity/Trunk Assessment Upper Extremity Assessment Upper Extremity Assessment: Generalized weakness (grossly 4-/5 strength)   Lower Extremity Assessment Lower Extremity Assessment: Defer to PT evaluation   Cervical / Trunk Assessment Cervical / Trunk Assessment: Normal   Communication Communication Communication: No difficulties   Cognition Arousal/Alertness: Awake/alert Behavior During Therapy: Flat affect Overall Cognitive Status: Within Functional Limits for tasks assessed                                 General Comments: 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 That he will be eating at the mess hall. When asked about family and friends assisting him at discharge patient states "no" and that his brother isn't reliable. Plan is for the school to assist him. That the school nurse might be able to check in on him.      Prior Functioning/Environment Prior Level of Function : Independent/Modified Independent             Mobility Comments: Pt works as Tourist information centre manager Problem List: Decreased activity tolerance;Decreased strength;Impaired balance (sitting and/or standing)      OT Treatment/Interventions: Self-care/ADL training;DME and/or AE instruction;Therapeutic exercise;Patient/family education;Therapeutic activities    OT Goals(Current goals can be found in the care plan section) Acute Rehab OT Goals Patient Stated Goal: to walk without back pain OT Goal Formulation: With patient Time For Goal  Achievement: 01/28/22 Potential to Achieve Goals: Fair  OT Frequency: Min 2X/week    Co-evaluation              AM-PAC OT "6 Clicks" Daily Activity     Outcome Measure Help from another person eating meals?: None Help from another person taking care of personal grooming?: A Little Help from another person toileting, which includes using toliet, bedpan, or urinal?: A Little Help from another person bathing (including washing, rinsing, drying)?: A Little Help from another person to put on and taking off regular upper body clothing?: A Little Help from another person to put on and taking off regular lower body clothing?: A Little 6 Click Score: 19   End of Session Equipment Utilized During Treatment: Rolling walker (2 wheels) Nurse Communication: Mobility status  Activity Tolerance: Patient limited by pain Patient left: in chair;with  chair alarm set  OT Visit Diagnosis: Muscle weakness (generalized) (M62.81)                Time: 9242-6834 OT Time Calculation (min): 25 min Charges:  OT General Charges $OT Visit: 1 Visit OT Evaluation $OT Eval Low Complexity: 1 Low OT Treatments $Therapeutic Activity: 8-22 mins  Donnella Sham, OTR/L Acute Care Rehab Services  Office [phone]   Kelli Churn 01/15/2022, 3:51 PM

## 2022-01-15 NOTE — Progress Notes (Signed)
  Progress Note   Patient: Nathaniel Hanson YEL:[id number] DOB: 1980-01-23 DOA: 12/21/2021     24 DOS: the patient was seen and examined on 01/15/2022   Brief hospital course: 41 year old male who presented to the hospital after sigmoid volvulus which caused syncope. He underwent a flex sigmoidoscopy on 12/6 without success. On 12/7, the patient underwent a sigmoid colectomy and mobilization of the splenic flexure.  His postop course has been complicated by a persistent ileus.  He was noted to have fascial dehiscence and return to the OR on 12/11 for placement of retention sutures.  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 Dietitian following, continuing with calorie count. Continue TPN for now -Per General Surgery, recs for rectal CT with contrast. CT reviewed, finding of ileus. Recs per Gen Surg for OOB moving, bowel regimen, miralax, fiber, suppository    Active Problems: Mildly elevated LFTs  -he has no prior history of elevation - improved, total bili normal   Glucose intolerance - A1c is 5.7 - Cont diet/lifestyle modification     Syncope -Cause unclear but suspected to be vasovagal vs deconditioning from prolonged laying in bed this visit -PT consulted   Hyponatremia, sinus tachycardia and dehydration -stable  Obesity Body mass index is 32.05 kg/m.      Subjective: Just received suppository when seen. Denies nausea  Physical Exam: Vitals:   01/15/22 0200 01/15/22 0502 01/15/22 0900 01/15/22 1321  BP: 124/82 108/62 124/82 (!) 128/94  Pulse: (!) 107 (!) 110 (!) 109 (!) 109  Resp: 18 18 18 18   Temp: 98.1 F (36.7 C) 98 F (36.7 C) (!) 97.4 F (36.3 C) 98.4 F (36.9 C)  TempSrc: Oral Oral Oral Oral  SpO2: 95% 96% 96% 96%  Weight:      Height:       General exam: Awake, laying in bed, in nad Respiratory  system: Normal respiratory effort, no wheezing Cardiovascular system: regular rate, s1, s2 Gastrointestinal system: Soft, nondistended, positive BS Central nervous system: CN2-12 grossly intact, strength intact Extremities: Perfused, no clubbing Skin: Normal skin turgor, no notable skin lesions seen Psychiatry: Mood normal // no visual hallucinations   Data Reviewed:  Labs reviewed: Na 134, K 4.0, Cr 0.75  Family Communication: Pt in room, family not at bedside  Disposition: Status is: Inpatient Remains inpatient appropriate because: Severity of illness  Planned Discharge Destination: Home    Author: , MD 01/15/2022 4:37 PM  For on call review [email or web address].

## 2022-01-15 NOTE — Progress Notes (Addendum)
PHARMACY - TOTAL PARENTERAL NUTRITION CONSULT NOTE   Indication: Prolonged ileus  Patient Measurements: Height: _0  (185.4 cm) Weight: 110.2 kg (242 lb 15.2 oz) IBW/kg (Calculated) : 79.9 TPN AdjBW (KG): 88.9 Body mass index is 32.05 kg/m.  Assessment: Patient is a 41 y.o M who presented to the ED on 12/21/21 for evaluation after he experienced syncope and blacked out at home.  Abdominal CT on 12/21/21 showed findings consistent with sigmoid volvulus.He underwent sigmoidoscopy on 12/22/21 with decompression of the volvulus. However, he still had significant sigmoid colon dilation s/p procedure and subsequently underwent sigmoid colectomy on 12/23/21. On 12/11, he had bleeding from surgical incision site as well as n/v and was found to have "fascial dehiscence and near evisceration of his bowel."  He was taken to the OR emergently on 12/27/21 for  exp lap with irrigation of the midline incision.  Abd x-ray on 12/27/21 showed findings consistent with post-op ileus. Fascial dehiscence. Pharmacy has been consulted to start TPN.  Recent Labs    01/13/22 0230 01/15/22 0411  NA 133* 134*  K 4.1 4.0  CL 104 104  CO2 22 27  GLUCOSE 158* 126*  BUN 16 16  CREATININE 0.76 0.75  CALCIUM 8.6* 8.5*  PHOS 3.2  --   MG 1.9  --   ALBUMIN 2.9*  --   ALKPHOS 102  --   AST 25  --   ALT 88*  --   BILITOT 0.7  --   TRIG 84  --     Glucose / Insulin: no hx DM - on mSSI q4h (13 units insulin given in the past 24 hrs) - goal cbgs <150: 128-157 over previous 24hrs Electrolytes: Last labs on 12/28 - WNL including CorrCa  except Na slightly low at 134  Renal: Scr wnl, BUN WNL. UOP decreasing Hepatic:  - AST, Tbili and Alk Phos WNL, ALT sl high - albumin 2.9 - TG 84 Intake / Output; MIVF: NS at 20 ml/hr  - I/O:   (+2.01 L) - UOP: 3250 ml charted - No BM charted on 12/29 GI Imaging: - 12/5 abd CT: Findings compatible with sigmoid volvulus. - 12/11 abd xray: Diffuse gaseous distention of bowel,  compatible with postoperative ileus. - 12/14 abd xray: Persistent diffuse gaseous distension of bowel, compatible with ileus, with increased small bowel distension in comparison to prior. - 12/18 CT: fluid collection in pelvis not definitively abscess. Pericolonic stranding of the splenic flexure and SB mesentery.  - 12/21 abd XRAY:  left basilar atelectasis with superimposed aerated basilar lung. No evidence of bowel obstruction. Gaseous distention of the colon  - 12/24 for CT abd/pelvis: no abscess, Free fluid in peritoneal cavity nearly completely resolved  - 12/29: Small bowel and colon are dilated with fluid and air, indicative of an ileus. Sigmoid anastomosis without evidence of a contrast leak on delayed imaging. Scattered ascites.  GI Surgeries / Procedures:  - 12/6 Flexible Sigmoidoscopy: Volvulus. Successful complete decompression achieved - 12/7: sigmoid colectomy  - 12/11: exp lap with irrigation of midline incision - 12/11: NGT placed >> came out on 12/13, replaced 12/14, removed 12/22  Central access:  PICC placement  TPN start date: 12/28/21 PICC placement  Nutritional Goals: Goal TPN rate is 105 mL/hr (provides 146 g of protein and 2500 kcals per day)  RD Assessment: Refeeding syndrome s/p thiamine 100 mg for 5 days; completed 12/12-12/17. Estimated Needs Total Energy Estimated Needs: 5852-7782 kcals Total Protein Estimated Needs: 140-170 grams Total Fluid Estimated Needs: >/=  2.1L  Current Nutrition:  Soft diet (started 12/25); Nutritional supplements per RD 12/26-12/27 Calorie count shows minimal intake.   Medications:  metoclopramide QID, erythromycin IV for GI prokinetic effect, Miralax BID  - Per consult today, decrease the TPN rate in half.   Plan:  Decrease TPN to 55 mL/hr goal - Electrolytes in TPN:  Na 110 mEq/L  K 50 mEq/L  Ca 5 mEq/L  Mg 5 mEq/L Phos 10 mmol/L Cl:Ac 1:1 - Continue moderate SSI q4h - MIVF at 20 mL/hr - Monitor TPN labs on  Mon/Thurs;   - BMP, magnesium, phosphorus with AM labs  - Continue oral MVI  - F/U oral intake.    Thank you for allowing pharmacy to be a part of this patient's care.   Royetta Asal, PharmD, BCPS 01/15/2022 9:21 AM

## 2022-01-15 NOTE — Progress Notes (Signed)
19 Days Post-Op   Subjective/Chief Complaint: Doing better today, some bowel function, tol some diet, no n/v, ct yesterday   Objective: Vital signs in last 24 hours: Temp:  [98 F (36.7 C)-98.5 F (36.9 C)] 98 F (36.7 C) (12/30 0502) Pulse Rate:  [102-122] 110 (12/30 0502) Resp:  [14-18] 18 (12/30 0502) BP: (108-144)/(62-102) 108/62 (12/30 0502) SpO2:  [95 %-100 %] 96 % (12/30 0502) Last BM Date : 01/14/22  Intake/Output from previous day: 12/29 0701 - 12/30 0700 In: 5261.7 [P.O.:1920; I.V.:2778.9; IV Piggyback:562.7] Out: 3250 [Urine:3250] Intake/Output this shift: No intake/output data recorded.   General no distress Pulm effort normal Cv regular Ab mod distended wound dressed with some expected drainage, nontender  Lab Results:  Recent Labs    01/14/22 1010  WBC 9.9  HGB 12.5*  HCT 38.2*  PLT 312   BMET Recent Labs    01/13/22 0230 01/15/22 0411  NA 133* 134*  K 4.1 4.0  CL 104 104  CO2 22 27  GLUCOSE 158* 126*  BUN 16 16  CREATININE 0.76 0.75  CALCIUM 8.6* 8.5*   PT/INR No results for input(s): "LABPROT", "INR" in the last 72 hours. ABG No results for input(s): "PHART", "HCO3" in the last 72 hours.  Invalid input(s): "PCO2", "PO2"  Studies/Results: CT ABDOMEN PELVIS W CONTRAST  Result Date: 01/14/2022 CLINICAL DATA:  Abdominal pain, postop colon resection with primary anastomosis. History of sigmoid volvulus. EXAM: CT ABDOMEN AND PELVIS WITH CONTRAST TECHNIQUE: Multidetector CT imaging of the abdomen and pelvis was performed using the standard protocol following bolus administration of intravenous contrast. RADIATION DOSE REDUCTION: This exam was performed according to the departmental dose-optimization program which includes automated exposure control, adjustment of the mA and/or kV according to patient size and/or use of iterative reconstruction technique. CONTRAST:  OMNIPAQUE IOHEXOL 300 MG/ML SOLN, OMNIPAQUE IOHEXOL 300 MG/ML SOLN  COMPARISON:  01/09/2022. FINDINGS: Lower chest: Bibasilar atelectasis. Heart is enlarged. No pericardial or pleural effusion. Distal esophagus is grossly unremarkable. Hepatobiliary: Liver is unremarkable. There may be mild vicarious excretion of contrast in the gallbladder. No biliary ductal dilatation. Pancreas: Negative. Spleen: 11 mm low-attenuation lesion in the inferior spleen, unchanged and too small characterize. Adrenals/Urinary Tract: Adrenal glands and kidneys are unremarkable. Ureters are decompressed. Bladder is relatively low in volume. Stomach/Bowel: Stomach is unremarkable. Mild distension of the small bowel and colon with fluid and air. Sigmoid anastomosis. No extraluminal air. Delayed imaging using large volume rectal contrast shows no leak. Vascular/Lymphatic: Vascular structures are unremarkable. No pathologically enlarged lymph nodes. Reproductive: Prostate is visualized. Other: Scattered free fluid. No free air. Mesenteries and peritoneum are otherwise unremarkable. Musculoskeletal: None. IMPRESSION: 1. Small bowel and colon are dilated with fluid and air, indicative of an ileus. 2. Sigmoid anastomosis without evidence of a contrast leak on delayed imaging. 3. Scattered ascites. Electronically Signed   By: Leanna Battles M.D.   On: 01/14/2022 15:04    Anti-infectives: Anti-infectives (From admission, onward)    Start     Dose/Rate Route Frequency Ordered Stop   01/11/22 1400  erythromycin 250 mg in sodium chloride 0.9 % 100 mL IVPB        250 mg 100 mL/hr over 60 Minutes Intravenous Every 8 hours 01/11/22 1058     12/27/21 2130  ceFAZolin (ANCEF) IVPB 2g/100 mL premix        2 g 200 mL/hr over 30 Minutes Intravenous  Once 12/27/21 2039 12/27/21 2138   12/23/21 1400  cefoTEtan (CEFOTAN) 2  g in sodium chloride 0.9 % 100 mL IVPB        2 g 200 mL/hr over 30 Minutes Intravenous 60 min pre-op 12/23/21 1040 12/23/21 1444       Assessment/Plan: Sigmoid volvulus on admission  imaging - operative findings of sigmoid obstruction due to adhesions POD 23 s/p sigmoid colectomy, mobilization of splenic flexure Dr. Dossie Der 12/7 POD 19 s/p exploratory laparotomy placement of retention sutures for fascial dehiscence Dr. 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 On scheduled erythromycin, reglan. - afebrile, labs stable  - CT 12/18 with fluid collection in pelvis not definitively abscess. Pericolonic stranding of the splenic flexure and SB mesentery.  Repeat CT 12/24 without significant fluid collection/mostly resolved; persistent distention of colon c/w adynamic ileus, no obstruction -repeat ct with rectal contrast yesterday shows small bowel and colon with fluid and air indicative of ileus anastomosis is patent with no issues -discussed today need to be oob moving, bowel regimen including miralax, fiber, try enema/supppository - half tpn today ambulate - Wound care FEN: TPN half today ID: none currently VTE: lovenox  Emelia Loron 01/15/2022

## 2022-01-16 LAB — BASIC METABOLIC PANEL
Anion gap: 5 (ref 5–15)
BUN: 14 mg/dL (ref 6–20)
CO2: 28 mmol/L (ref 22–32)
Calcium: 8.7 mg/dL — ABNORMAL LOW (ref 8.9–10.3)
Chloride: 102 mmol/L (ref 98–111)
Creatinine, Ser: 0.76 mg/dL (ref 0.61–1.24)
GFR, Estimated: >60 mL/min (ref >60)
Glucose, Bld: 105 mg/dL — ABNORMAL HIGH (ref 70–99)
Potassium: 3.8 mmol/L (ref 3.5–5.1)
Sodium: 135 mmol/L (ref 135–145)

## 2022-01-16 LAB — GLUCOSE, CAPILLARY
Glucose-Capillary: 101 mg/dL — ABNORMAL HIGH (ref 70–99)
Glucose-Capillary: 112 mg/dL — ABNORMAL HIGH (ref 70–99)
Glucose-Capillary: 113 mg/dL — ABNORMAL HIGH (ref 70–99)
Glucose-Capillary: 126 mg/dL — ABNORMAL HIGH (ref 70–99)
Glucose-Capillary: 129 mg/dL — ABNORMAL HIGH (ref 70–99)
Glucose-Capillary: 130 mg/dL — ABNORMAL HIGH (ref 70–99)

## 2022-01-16 LAB — PHOSPHORUS: Phosphorus: 4.5 mg/dL (ref 2.5–4.6)

## 2022-01-16 LAB — MAGNESIUM: Magnesium: 1.9 mg/dL (ref 1.7–2.4)

## 2022-01-16 MED ORDER — TRAVASOL 10 % IV SOLN
INTRAVENOUS | Status: AC
  Filled 2022-01-16: qty 765.6

## 2022-01-16 MED ORDER — INSULIN ASPART 100 UNIT/ML IJ SOLN
0.0000 [IU] | Freq: Four times a day (QID) | INTRAMUSCULAR | Status: DC
  Administered 2022-01-16 – 2022-01-20 (×3): 2 [IU] via SUBCUTANEOUS

## 2022-01-16 MED ORDER — METHOCARBAMOL 500 MG PO TABS
500.0000 mg | ORAL_TABLET | Freq: Three times a day (TID) | ORAL | Status: AC
  Administered 2022-01-16 – 2022-01-17 (×4): 500 mg via ORAL
  Filled 2022-01-16 (×4): qty 1

## 2022-01-16 MED ORDER — ACETAMINOPHEN 325 MG PO TABS: 650.0000 mg | ORAL_TABLET | Freq: Four times a day (QID) | ORAL | Status: DC | PRN

## 2022-01-16 MED ORDER — DIATRIZOATE MEGLUMINE & SODIUM 66-10 % PO SOLN
90.0000 mL | Freq: Once | ORAL | Status: AC
  Administered 2022-01-16: 90 mL via ORAL
  Filled 2022-01-16: qty 90

## 2022-01-16 NOTE — Progress Notes (Signed)
PHARMACY - TOTAL PARENTERAL NUTRITION CONSULT NOTE   Indication: Prolonged ileus  Patient Measurements: Height: _0  (185.4 cm) Weight: 110.2 kg (242 lb 15.2 oz) IBW/kg (Calculated) : 79.9 TPN AdjBW (KG): 88.9 Body mass index is 32.05 kg/m.  Assessment: Patient is a 41 y.o M who presented to the ED on 12/21/21 for evaluation after he experienced syncope and blacked out at home.  Abdominal CT on 12/21/21 showed findings consistent with sigmoid volvulus.He underwent sigmoidoscopy on 12/22/21 with decompression of the volvulus. However, he still had significant sigmoid colon dilation s/p procedure and subsequently underwent sigmoid colectomy on 12/23/21. On 12/11, he had bleeding from surgical incision site as well as n/v and was found to have "fascial dehiscence and near evisceration of his bowel."  He was taken to the OR emergently on 12/27/21 for  exp lap with irrigation of the midline incision.  Abd x-ray on 12/27/21 showed findings consistent with post-op ileus. Fascial dehiscence. Pharmacy has been consulted to start TPN.  Recent Labs    01/15/22 0411 01/16/22 0211  NA 134* 135  K 4.0 3.8  CL 104 102  CO2 27 28  GLUCOSE 126* 105*  BUN 16 14  CREATININE 0.75 0.76  CALCIUM 8.5* 8.7*  PHOS  --  4.5  MG  --  1.9    Glucose / Insulin: no hx DM - on mSSI q4h (4 units insulin given in the past 24 hrs) - goal cbgs <150: 101-136 over previous 24hrs Electrolytes: WNL including CorrCa   Renal: Scr wnl, BUN WNL. UOP decreasing Hepatic:  - AST, Tbili and Alk Phos WNL, ALT sl high - albumin 2.9 - TG 84 Intake / Output; MIVF: NS at 20 ml/hr  - I/O:   (-1.37 L) - UOP: 3000 ml charted - BM charted on 12/30 GI Imaging: - 12/5 abd CT: Findings compatible with sigmoid volvulus. - 12/11 abd xray: Diffuse gaseous distention of bowel, compatible with postoperative ileus. - 12/14 abd xray: Persistent diffuse gaseous distension of bowel, compatible with ileus, with increased small bowel  distension in comparison to prior. - 12/18 CT: fluid collection in pelvis not definitively abscess. Pericolonic stranding of the splenic flexure and SB mesentery.  - 12/21 abd XRAY:  left basilar atelectasis with superimposed aerated basilar lung. No evidence of bowel obstruction. Gaseous distention of the colon  - 12/24 for CT abd/pelvis: no abscess, Free fluid in peritoneal cavity nearly completely resolved  - 12/29: Small bowel and colon are dilated with fluid and air, indicative of an ileus. Sigmoid anastomosis without evidence of a contrast leak on delayed imaging. Scattered ascites.  GI Surgeries / Procedures:  - 12/6 Flexible Sigmoidoscopy: Volvulus. Successful complete decompression achieved - 12/7: sigmoid colectomy  - 12/11: exp lap with irrigation of midline incision - 12/11: NGT placed >> came out on 12/13, replaced 12/14, removed 12/22  Central access:  PICC placement  TPN start date: 12/28/21 PICC placement  Nutritional Goals: Goal TPN rate is 105 mL/hr (provides 146 g of protein and 2500 kcals per day)  RD Assessment: Refeeding syndrome s/p thiamine 100 mg for 5 days; completed 12/12-12/17. Estimated Needs Total Energy Estimated Needs: 5093-2671 kcals Total Protein Estimated Needs: 140-170 grams Total Fluid Estimated Needs: >/= 2.1L  Current Nutrition:  Soft diet (started 12/25); Nutritional supplements per RD 12/26-12/27 Calorie count shows minimal intake.   Medications:  erythromycin IV for GI prokinetic effect, Miralax BID  - Per consult on 12/30, decrease the TPN rate in half.   Plan:  Continue  TPN at 55 mL/hr, half rate as requested by MD  - Electrolytes in TPN:  Na 110 mEq/L  K 60 mEq/L  Ca 5 mEq/L  Mg 7 mEq/L Phos 6 mmol/L Cl:Ac 1:1 - Adjust  moderate SSI  to q6h - MIVF at 20 mL/hr - Monitor TPN labs on Mon/Thurs;   - Continue oral MVI  - F/U oral intake.    Thank you for allowing pharmacy to be a part of this patient's care.   Royetta Asal, PharmD, BCPS 01/16/2022 9:22 AM

## 2022-01-16 NOTE — Progress Notes (Signed)
20 Days Post-Op   Subjective/Chief Complaint: Had a more normal bm and good day yesterday. His bball team won a tournament and brought the trophy by.  Still with some nausea with dinner although ate most of lunch, walked only in room   Objective: Vital signs in last 24 hours: Temp:  [97.4 F (36.3 C)-98.4 F (36.9 C)] 98.1 F (36.7 C) (12/31 0431) Pulse Rate:  [104-109] 105 (12/31 0431) Resp:  [18] 18 (12/31 0431) BP: (112-145)/(60-99) 112/60 (12/31 0431) SpO2:  [95 %-96 %] 96 % (12/31 0431) Last BM Date : 01/15/22  Intake/Output from previous day: 12/30 0701 - 12/31 0700 In: 1682.7 [P.O.:180; I.V.:1376.3; IV Piggyback:126.4] Out: 2600 [Urine:2600] Intake/Output this shift: No intake/output data recorded.  General no distress Pulm effort normal Cv regular Ab mod distended, nontender, wound granulating well    Lab Results:  Recent Labs    01/14/22 1010  WBC 9.9  HGB 12.5*  HCT 38.2*  PLT 312   BMET Recent Labs    01/15/22 0411 01/16/22 0211  NA 134* 135  K 4.0 3.8  CL 104 102  CO2 27 28  GLUCOSE 126* 105*  BUN 16 14  CREATININE 0.75 0.76  CALCIUM 8.5* 8.7*   PT/INR No results for input(s): "LABPROT", "INR" in the last 72 hours. ABG No results for input(s): "PHART", "HCO3" in the last 72 hours.  Invalid input(s): "PCO2", "PO2"  Studies/Results: CT ABDOMEN PELVIS W CONTRAST  Result Date: 01/14/2022 CLINICAL DATA:  Abdominal pain, postop colon resection with primary anastomosis. History of sigmoid volvulus. EXAM: CT ABDOMEN AND PELVIS WITH CONTRAST TECHNIQUE: Multidetector CT imaging of the abdomen and pelvis was performed using the standard protocol following bolus administration of intravenous contrast. RADIATION DOSE REDUCTION: This exam was performed according to the departmental dose-optimization program which includes automated exposure control, adjustment of the mA and/or kV according to patient size and/or use of iterative reconstruction technique.  CONTRAST:  OMNIPAQUE IOHEXOL 300 MG/ML SOLN, OMNIPAQUE IOHEXOL 300 MG/ML SOLN COMPARISON:  01/09/2022. FINDINGS: Lower chest: Bibasilar atelectasis. Heart is enlarged. No pericardial or pleural effusion. Distal esophagus is grossly unremarkable. Hepatobiliary: Liver is unremarkable. There may be mild vicarious excretion of contrast in the gallbladder. No biliary ductal dilatation. Pancreas: Negative. Spleen: 11 mm low-attenuation lesion in the inferior spleen, unchanged and too small characterize. Adrenals/Urinary Tract: Adrenal glands and kidneys are unremarkable. Ureters are decompressed. Bladder is relatively low in volume. Stomach/Bowel: Stomach is unremarkable. Mild distension of the small bowel and colon with fluid and air. Sigmoid anastomosis. No extraluminal air. Delayed imaging using large volume rectal contrast shows no leak. Vascular/Lymphatic: Vascular structures are unremarkable. No pathologically enlarged lymph nodes. Reproductive: Prostate is visualized. Other: Scattered free fluid. No free air. Mesenteries and peritoneum are otherwise unremarkable. Musculoskeletal: None. IMPRESSION: 1. Small bowel and colon are dilated with fluid and air, indicative of an ileus. 2. Sigmoid anastomosis without evidence of a contrast leak on delayed imaging. 3. Scattered ascites. Electronically Signed   By: Leanna Battles M.D.   On: 01/14/2022 15:04    Anti-infectives: Anti-infectives (From admission, onward)    Start     Dose/Rate Route Frequency Ordered Stop   01/11/22 1400  erythromycin 250 mg in sodium chloride 0.9 % 100 mL IVPB        250 mg 100 mL/hr over 60 Minutes Intravenous Every 8 hours 01/11/22 1058     12/27/21 2130  ceFAZolin (ANCEF) IVPB 2g/100 mL premix        2  g 200 mL/hr over 30 Minutes Intravenous  Once 12/27/21 2039 12/27/21 2138   12/23/21 1400  cefoTEtan (CEFOTAN) 2 g in sodium chloride 0.9 % 100 mL IVPB        2 g 200 mL/hr over 30 Minutes Intravenous 60 min pre-op  12/23/21 1040 12/23/21 1444       Assessment/Plan: Sigmoid volvulus on admission imaging - operative findings of sigmoid obstruction due to adhesions POD 24 s/p sigmoid colectomy, mobilization of splenic flexure Dr. Dossie Der  POD 20 s/p exploratory laparotomy placement of retention sutures for fascial dehiscence Dr. 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 On scheduled erythromycin, reglan. - afebrile, labs stable  - CT 12/18 with fluid collection in pelvis not definitively abscess. Pericolonic stranding of the splenic flexure and SB mesentery.  Repeat CT 12/24 without significant fluid collection/mostly resolved; persistent distention of colon c/w adynamic ileus, no obstruction -repeat ct with rectal contrast 12/29 shows small bowel and colon with fluid and air indicative of ileus anastomosis is patent with no issues -discussed today again need to be oob moving, bowel regimen including miralax, fiber, suppository, will  also give a dose of gastrograffin today to see if that helps - half tpn today ambulate - Wound care FEN: TPN half rate, wil recheck labs for TPN but otherwise no additional for now ID: none currently VTE: lovenox  Emelia Loron  01/16/2022

## 2022-01-16 NOTE — Progress Notes (Signed)
  Progress Note   Patient: Nathaniel Hanson:[id number] DOB: 06/28/1980 DOA: 12/21/2021     25 DOS: the patient was seen and examined on 01/16/2022   Brief hospital course: 41 year old male who presented to the hospital after sigmoid volvulus which caused syncope. He underwent a flex sigmoidoscopy on 12/6 without success. On 12/7, the patient underwent a sigmoid colectomy and mobilization of the splenic flexure.  His postop course has been complicated by a persistent ileus.  He was noted to have fascial dehiscence and return to the OR on 12/11 for placement of retention sutures.  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 Dietitian following, continuing with calorie count. Continue TPN for now -Per General Surgery, recs for rectal CT with contrast. CT reviewed, finding of ileus. Recs per Gen Surg for OOB moving, bowel regimen, miralax, fiber, suppository. Also trial of gastrograffin today    Active Problems: Mildly elevated LFTs  -he has no prior history of elevation - now improved   Glucose intolerance - A1c is 5.7 - Cont diet/lifestyle modification     Syncope -Cause unclear but suspected to be vasovagal vs deconditioning from prolonged laying in bed this visit -PT consulted   Hyponatremia, sinus tachycardia and dehydration -stable  Obesity Body mass index is 32.05 kg/m.      Subjective: Still no BM, currently undergoing another suppository when seen  Physical Exam: Vitals:   01/15/22 1321 01/15/22 2017 01/16/22 0431 01/16/22 1356  BP: (!) 128/94 (!) 145/99 112/60 123/82  Pulse: (!) 109 (!) 104 (!) 105 (!) 104  Resp: 18 18 18 17   Temp: 98.4 F (36.9 C)  98.1 F (36.7 C)   TempSrc: Oral  Oral   SpO2: 96% 95% 96% 96%  Weight:      Height:       General exam: Conversant, in no acute distress Respiratory  system: normal chest rise, clear, no audible wheezing Cardiovascular system: regular rhythm, s1-s2 Gastrointestinal system: Nondistended, nontender, pos BS Central nervous system: No seizures, no tremors Extremities: No cyanosis, no joint deformities Skin: No rashes, no pallor Psychiatry: Affect normal // no auditory hallucinations   Data Reviewed:  Labs reviewed: Na 135, K 3.8, Cr 0.76  Family Communication: Pt in room, family not at bedside  Disposition: Status is: Inpatient Remains inpatient appropriate because: Severity of illness  Planned Discharge Destination: Home    Author: , MD 01/16/2022 3:04 PM  For on call review [email or web address].

## 2022-01-16 NOTE — Plan of Care (Signed)
  Problem: Health Behavior/Discharge Planning: Goal: Ability to manage health-related needs will improve Outcome: Progressing   Problem: Clinical Measurements: Goal: Ability to maintain clinical measurements within normal limits will improve Outcome: Progressing Goal: Will remain free from infection Outcome: Progressing Goal: Diagnostic test results will improve Outcome: Progressing   Problem: Activity: Goal: Risk for activity intolerance will decrease Outcome: Progressing   Problem: Nutrition: Goal: Adequate nutrition will be maintained Outcome: Progressing   Problem: Skin Integrity: Goal: Risk for impaired skin integrity will decrease Outcome: Progressing   Problem: Education: Goal: Understanding of discharge needs will improve Outcome: Progressing Goal: Verbalization of understanding of the causes of altered bowel function will improve Outcome: Progressing   Problem: Activity: Goal: Ability to tolerate increased activity will improve Outcome: Progressing   Problem: Health Behavior/Discharge Planning: Goal: Identification of community resources to assist with postoperative recovery needs will improve Outcome: Progressing   Problem: Nutritional: Goal: Will attain and maintain optimal nutritional status will improve Outcome: Progressing   Problem: Clinical Measurements: Goal: Postoperative complications will be avoided or minimized Outcome: Progressing   Problem: Skin Integrity: Goal: Will show signs of wound healing Outcome: Progressing   Problem: Coping: Goal: Level of anxiety will decrease Outcome: Not Progressing   Problem: Elimination: Goal: Will not experience complications related to bowel motility Outcome: Not Progressing

## 2022-01-16 NOTE — Progress Notes (Signed)
Physical Therapy Treatment Patient Details Name: Nathaniel Hanson MRN: [id number] DOB: 1981/01/09 Today's Date: 01/16/2022   History of Present Illness Pt admitted from home 2* syncope causes by sigmoid volvulus.  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  Of note, pt experienced episode of syncope in hospital while in shower 01/10/22    PT Comments    LOS 26 days Pt progressing but slowly.  Discussed Int Pt Rehab with LPT as a possible plan.  General Gait Details: tolerated an increased distance but still requires heavy support on walker and exhibits deceased stride length with c/o B LE weakness.  Recliner following as a precaution.  Pt present with decreased muscle mass and limited endurance.    Prior to admit pt was Indep.   Recommendations for follow up therapy are one component of a multi-disciplinary discharge planning process, led by the attending physician.  Recommendations may be updated based on patient status, additional functional criteria and insurance authorization.  Follow Up Recommendations  Acute inpatient rehab (3hours/day)     Assistance Recommended at Discharge Intermittent Supervision/Assistance  Patient can return home with the following A little help with walking and/or transfers;A little help with bathing/dressing/bathroom;Assistance with cooking/housework;Help with stairs or ramp for entrance;Assist for transportation   Equipment Recommendations  Rolling walker (2 wheels)    Recommendations for Other Services Rehab consult (discussed with LPT)     Precautions / Restrictions Precautions Precautions: Fall Precaution Comments: abdominal binder in place Restrictions Weight Bearing Restrictions: No     Mobility  Bed Mobility               General bed mobility comments: OOB in recliner    Transfers Overall transfer level: Needs assistance Equipment used: Rolling walker (2 wheels) Transfers: Sit to/from  Stand Sit to Stand: Supervision, Min guard           General transfer comment: good use of B UE's to steady self    Ambulation/Gait Ambulation/Gait assistance: Supervision, Min guard Gait Distance (Feet): 225 Feet (one seated rest break) Assistive device: Rolling walker (2 wheels) Gait Pattern/deviations: Step-through pattern Gait velocity: decreased     General Gait Details: tolerated an increased distance but still requires heavy support on walker and exhibits deceased stride length with c/o B LE weakness.  Recliner following as a precaution.   Stairs             Wheelchair Mobility    Modified Rankin (Stroke Patients Only)       Balance                                            Cognition Arousal/Alertness: Awake/alert Behavior During Therapy: Flat affect Overall Cognitive Status: Within Functional Limits for tasks assessed                                 General Comments: AxO x 3 [facility] [employer] and [employer]        Exercises      General Comments        Pertinent Vitals/Pain Pain Assessment Pain Assessment: Faces Faces Pain Scale: Hurts a little bit Pain Location: back "stiff" Pain Descriptors / Indicators: Sore, Grimacing Pain Intervention(s): Monitored during session, Repositioned    Home Living  Prior Function            PT Goals (current goals can now be found in the care plan section) Progress towards PT goals: Progressing toward goals    Frequency    Min 3X/week      PT Plan Current plan remains appropriate    Co-evaluation              AM-PAC PT "6 Clicks" Mobility   Outcome Measure  Help needed turning from your back to your side while in a flat bed without using bedrails?: A Little Help needed moving from lying on your back to sitting on the side of a flat bed without using bedrails?: A  Little Help needed moving to and from a bed to a chair (including a wheelchair)?: A Little Help needed standing up from a chair using your arms (e.g., wheelchair or bedside chair)?: A Little Help needed to walk in hospital room?: A Little Help needed climbing 3-5 steps with a railing? : A Lot 6 Click Score: 17    End of Session Equipment Utilized During Treatment: Gait belt Activity Tolerance: Patient limited by fatigue Patient left: in chair;with call bell/phone within reach;with chair alarm set Nurse Communication: Mobility status PT Visit Diagnosis: Unsteadiness on feet (R26.81)     Time: 1501-1530 PT Time Calculation (min) (ACUTE ONLY): 29 min  Charges:  $Gait Training: 8-22 mins $Therapeutic Activity: 8-22 mins                     {Geroge Gilliam  PTA Acute  [employer] M-F          [phone] Weekend pager [phone]

## 2022-01-17 LAB — COMPREHENSIVE METABOLIC PANEL
ALT: 64 U/L — ABNORMAL HIGH (ref 0–44)
AST: 29 U/L (ref 15–41)
Albumin: 2.9 g/dL — ABNORMAL LOW (ref 3.5–5.0)
Alkaline Phosphatase: 91 U/L (ref 38–126)
Anion gap: 6 (ref 5–15)
BUN: 17 mg/dL (ref 6–20)
CO2: 26 mmol/L (ref 22–32)
Calcium: 8.7 mg/dL — ABNORMAL LOW (ref 8.9–10.3)
Chloride: 102 mmol/L (ref 98–111)
Creatinine, Ser: 0.81 mg/dL (ref 0.61–1.24)
GFR, Estimated: >60 mL/min (ref >60)
Glucose, Bld: 105 mg/dL — ABNORMAL HIGH (ref 70–99)
Potassium: 4.1 mmol/L (ref 3.5–5.1)
Sodium: 134 mmol/L — ABNORMAL LOW (ref 135–145)
Total Bilirubin: 0.8 mg/dL (ref 0.3–1.2)
Total Protein: 6.1 g/dL — ABNORMAL LOW (ref 6.5–8.1)

## 2022-01-17 LAB — MAGNESIUM: Magnesium: 2 mg/dL (ref 1.7–2.4)

## 2022-01-17 LAB — GLUCOSE, CAPILLARY
Glucose-Capillary: 120 mg/dL — ABNORMAL HIGH (ref 70–99)
Glucose-Capillary: 109 mg/dL — ABNORMAL HIGH (ref 70–99)
Glucose-Capillary: 118 mg/dL — ABNORMAL HIGH (ref 70–99)

## 2022-01-17 LAB — PHOSPHORUS: Phosphorus: 4.8 mg/dL — ABNORMAL HIGH (ref 2.5–4.6)

## 2022-01-17 MED ORDER — TRAVASOL 10 % IV SOLN
INTRAVENOUS | Status: AC
  Filled 2022-01-17: qty 752.4

## 2022-01-17 MED ORDER — METHOCARBAMOL 500 MG PO TABS
500.0000 mg | ORAL_TABLET | Freq: Three times a day (TID) | ORAL | Status: DC
  Administered 2022-01-17 – 2022-01-21 (×12): 500 mg via ORAL
  Filled 2022-01-17 (×12): qty 1

## 2022-01-17 MED ORDER — TRAVASOL 10 % IV SOLN: INTRAVENOUS | Status: DC

## 2022-01-17 MED ORDER — METOCLOPRAMIDE HCL 5 MG/ML IJ SOLN
10.0000 mg | Freq: Three times a day (TID) | INTRAMUSCULAR | Status: AC
  Administered 2022-01-17 – 2022-01-18 (×4): 10 mg via INTRAVENOUS
  Filled 2022-01-17 (×4): qty 2

## 2022-01-17 NOTE — Progress Notes (Signed)
  Progress Note   Patient: Nathaniel Hanson KDT:[id number] DOB: [birth year] DOA: 12/21/2021     26 DOS: the patient was seen and examined on 01/17/2022   Brief hospital course: 42 year old male who presented to the hospital after sigmoid volvulus which caused syncope. He underwent a flex sigmoidoscopy on 12/6 without success. On 12/7, the patient underwent a sigmoid colectomy and mobilization of the splenic flexure.  His postop course has been complicated by a persistent ileus.  He was noted to have fascial dehiscence and return to the OR on 12/11 for placement of retention sutures.  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 Dietitian following, continuing with calorie count. Continue TPN for now -Per General Surgery, recs for continued increased mobility and cathartics. If no significant results in the next 24hrs, recs for involving GI to assist    Active Problems: Mildly elevated LFTs  -he has no prior history of elevation - now improved   Glucose intolerance - A1c is 5.7 - Cont diet/lifestyle modification     Syncope -Cause unclear but suspected to be vasovagal vs deconditioning from prolonged laying in bed this visit -PT consulted, recs for CIR   Hyponatremia, sinus tachycardia and dehydration -stable  Obesity Body mass index is 32.05 kg/m.      Subjective: Reports BM, ambulating in hallway  Physical Exam: Vitals:   01/16/22 1356 01/16/22 2031 01/17/22 0619 01/17/22 1356  BP: 123/82 120/77 112/73 125/82  Pulse: (!) 104 (!) 101 (!) 105 (!) 103  Resp: 17 17 17 16   Temp:  97.8 F (36.6 C) 98.1 F (36.7 C) (!) 97.4 F (36.3 C)  TempSrc:  Oral Oral   SpO2: 96% 98% 97% 99%  Weight:      Height:       General exam: Awake, laying in bed, in nad Respiratory system: Normal respiratory effort, no  wheezing Cardiovascular system: regular rate, s1, s2 Gastrointestinal system: Soft, abd binder in place Central nervous system: CN2-12 grossly intact, strength intact Extremities: Perfused, no clubbing Skin: Normal skin turgor, no notable skin lesions seen Psychiatry: Mood normal // no visual hallucinations   Data Reviewed:  Labs reviewed: Na 134, K 4.1, Cr 0.81  Family Communication: Pt in room, family not at bedside  Disposition: Status is: Inpatient Remains inpatient appropriate because: Severity of illness  Planned Discharge Destination: Rehab    Author: Marylu Lund, MD 01/17/2022 3:12 PM  For on call review [email or web address].

## 2022-01-17 NOTE — Progress Notes (Addendum)
PHARMACY - TOTAL PARENTERAL NUTRITION CONSULT NOTE   Indication: Prolonged ileus  Patient Measurements: Height: _0  (185.4 cm) Weight: 110.2 kg (242 lb 15.2 oz) IBW/kg (Calculated) : 79.9 TPN AdjBW (KG): 88.9 Body mass index is 32.05 kg/m.  Assessment: Patient is a 42 y.o M who presented to the ED on 12/21/21 for evaluation after he experienced syncope and blacked out at home.  Abdominal CT on 12/21/21 showed findings consistent with sigmoid volvulus.He underwent sigmoidoscopy on 12/22/21 with decompression of the volvulus. However, he still had significant sigmoid colon dilation s/p procedure and subsequently underwent sigmoid colectomy on 12/23/21. On 12/11, he had bleeding from surgical incision site as well as n/v and was found to have "fascial dehiscence and near evisceration of his bowel."  He was taken to the OR emergently on 12/27/21 for  exp lap with irrigation of the midline incision.  Abd x-ray on 12/27/21 showed findings consistent with post-op ileus. Fascial dehiscence. Pharmacy has been consulted to start TPN.  Recent Labs    01/16/22 0211 01/17/22 0209  NA 135 134*  K 3.8 4.1  CL 102 102  CO2 28 26  GLUCOSE 105* 105*  BUN 14 17  CREATININE 0.76 0.81  CALCIUM 8.7* 8.7*  PHOS 4.5 4.8*  MG 1.9 2.0  ALBUMIN  --  2.9*  ALKPHOS  --  91  AST  --  29  ALT  --  64*  BILITOT  --  0.8    Glucose / Insulin: no hx DM - on mSSI q4h (4 units insulin given in the past 24 hrs) - goal cbgs <150: 112-130  over previous 24hrs Electrolytes: Na is slightly low at 134, Phosphorus high at 4.8, other are WNL including CorrCa   Renal: Scr wnl, BUN WNL.  Hepatic:  - AST, Tbili and Alk Phos WNL, ALT sl high - albumin 2.9 - TG 84 Intake / Output; MIVF: NS at 20 ml/hr  - I/O:   (+2.22 L) - UOP: 750 ml, likely not fully charted  - BM charted on 12/31 GI Imaging: - 12/5 abd CT: Findings compatible with sigmoid volvulus. - 12/11 abd xray: Diffuse gaseous distention of bowel, compatible  with postoperative ileus. - 12/14 abd xray: Persistent diffuse gaseous distension of bowel, compatible with ileus, with increased small bowel distension in comparison to prior. - 12/18 CT: fluid collection in pelvis not definitively abscess. Pericolonic stranding of the splenic flexure and SB mesentery.  - 12/21 abd XRAY:  left basilar atelectasis with superimposed aerated basilar lung. No evidence of bowel obstruction. Gaseous distention of the colon  - 12/24 for CT abd/pelvis: no abscess, Free fluid in peritoneal cavity nearly completely resolved  - 12/29: Small bowel and colon are dilated with fluid and air, indicative of an ileus. Sigmoid anastomosis without evidence of a contrast leak on delayed imaging. Scattered ascites.  GI Surgeries / Procedures:  - 12/6 Flexible Sigmoidoscopy: Volvulus. Successful complete decompression achieved - 12/7: sigmoid colectomy  - 12/11: exp lap with irrigation of midline incision - 12/11: NGT placed >> came out on 12/13, replaced 12/14, removed 12/22  Central access:  PICC placement  TPN start date: 12/28/21 PICC placement  Nutritional Goals: Goal TPN rate is 105 mL/hr (provides 143 g of protein and 2489 kcals per day)  RD Assessment: Refeeding syndrome s/p thiamine 100 mg for 5 days; completed 12/12-12/17. Estimated Needs Total Energy Estimated Needs: 0938-1829 kcals Total Protein Estimated Needs: 140-170 grams Total Fluid Estimated Needs: >/= 2.1L  Current Nutrition:  Soft diet (started 12/25); Nutritional supplements per RD 12/26-12/27 Calorie count shows minimal intake.   Medications:  erythromycin IV for GI prokinetic effect, Miralax BID. 1/1: 4 doses of Reglan added   - Per consult on 12/30, decrease the TPN rate in half.   Plan:  Continue  TPN at 55 mL/hr, half rate as requested by MD  - Electrolytes in TPN:  Na 125 mEq/L  K 60 mEq/L  Ca 6 mEq/L  Mg 7 mEq/L Phos 0 mmol/L Cl:Ac 1:1 - Adjust  moderate SSI  to q6h - MIVF at 20  mL/hr - BMP, magnesium, phosphorus with AM labs - Monitor TPN labs on Mon/Thurs - Continue oral MVI  - F/U oral intake.    Thank you for allowing pharmacy to be a part of this patient's care.   Royetta Asal, PharmD, BCPS 01/17/2022 8:07 AM

## 2022-01-17 NOTE — Plan of Care (Signed)
Problem: Clinical Measurements: Goal: Ability to maintain clinical measurements within normal limits will improve Outcome: Progressing   Problem: Activity: Goal: Risk for activity intolerance will decrease Outcome: Progressing   Problem: Nutrition: Goal: Adequate nutrition will be maintained Outcome: Progressing   Problem: Coping: Goal: Level of anxiety will decrease Outcome: [place] [facility] Teegarden, RN 01/17/22 1:25 PM

## 2022-01-17 NOTE — Progress Notes (Signed)
21 Days Post-Op   Subjective/Chief Complaint: Some bowel function, ate lunch and dinner pretty well, back pain, ambulated in halls   Objective: Vital signs in last 24 hours: Temp:  [97.8 F (36.6 C)-98.1 F (36.7 C)] 98.1 F (36.7 C) (01/01 0619) Pulse Rate:  [101-105] 105 (01/01 0619) Resp:  [17] 17 (01/01 0619) BP: (112-123)/(73-82) 112/73 (01/01 0619) SpO2:  [96 %-98 %] 97 % (01/01 0619) Last BM Date : 01/16/22  Intake/Output from previous day: 12/31 0701 - 01/01 0700 In: 2968 [P.O.:600; I.V.:1968; IV Piggyback:400] Out: 750 [Urine:750] Intake/Output this shift: No intake/output data recorded.  Ab mod distended, nontender  Lab Results:  Recent Labs    01/14/22 1010  WBC 9.9  HGB 12.5*  HCT 38.2*  PLT 312   BMET Recent Labs    01/16/22 0211 01/17/22 0209  NA 135 134*  K 3.8 4.1  CL 102 102  CO2 28 26  GLUCOSE 105* 105*  BUN 14 17  CREATININE 0.76 0.81  CALCIUM 8.7* 8.7*   PT/INR No results for input(s): "LABPROT", "INR" in the last 72 hours. ABG No results for input(s): "PHART", "HCO3" in the last 72 hours.  Invalid input(s): "PCO2", "PO2"  Studies/Results: No results found.  Anti-infectives: Anti-infectives (From admission, onward)    Start     Dose/Rate Route Frequency Ordered Stop   01/11/22 1400  erythromycin 250 mg in sodium chloride 0.9 % 100 mL IVPB        250 mg 100 mL/hr over 60 Minutes Intravenous Every 8 hours 01/11/22 1058     12/27/21 2130  ceFAZolin (ANCEF) IVPB 2g/100 mL premix        2 g 200 mL/hr over 30 Minutes Intravenous  Once 12/27/21 2039 12/27/21 2138   12/23/21 1400  cefoTEtan (CEFOTAN) 2 g in sodium chloride 0.9 % 100 mL IVPB        2 g 200 mL/hr over 30 Minutes Intravenous 60 min pre-op 12/23/21 1040 12/23/21 1444       Assessment/Plan: Sigmoid volvulus on admission imaging - operative findings of sigmoid obstruction due to adhesions POD 25 s/p sigmoid colectomy, mobilization of splenic flexure Dr. Thermon Leyland   POD 21 s/p exploratory laparotomy placement of retention sutures for fascial dehiscence Dr. 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 On scheduled erythromycin, reglan. - afebrile, labs stable  - CT 12/18 with fluid collection in pelvis not definitively abscess. Pericolonic stranding of the splenic flexure and SB mesentery.  Repeat CT 12/24 without significant fluid collection/mostly resolved; persistent distention of colon c/w adynamic ileus, no obstruction -repeat ct with rectal contrast 12/29 shows small bowel and colon with fluid and air indicative of ileus anastomosis is patent with no issues -discussed today again need to be oob moving, bowel regimen including miralax, fiber, suppository -I discussed trying 24 hours of reglan as well. Discussed risk of TD with him but I think that benefit outweighs risk at this point. Not sure if he had underlying motility issue preop and still wonder why he had this process- he did have episodes of constipation where he would have a bm per week.  If reglan does not help would have GI reevaluate him as well.  - half tpn today ambulate - Wound care FEN: TPN half rate, wil recheck labs for TPN but otherwise no additional for now ID: none currently VTE: [email or web address]  Rolm Bookbinder 01/17/2022

## 2022-01-17 NOTE — Progress Notes (Signed)
Physical Therapy Treatment Patient Details Name: Nathaniel Hanson MRN: [id number] DOB: Mar 27, 1980 Today's Date: 01/17/2022   History of Present Illness Pt admitted from home 2* syncope causes by sigmoid volvulus.  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  Of note, pt experienced episode of syncope in hospital while in shower 01/10/22    PT Comments    General Comments: AxO x [place] and [employer].  Pt OOB in recliner watching TV.  Feeling "sleepy" today.  Assisted with amb in hallway.  General transfer comment: good use of B UE's to steady self, just requires increased time due to back and ABD pain.  General Gait Details: decreased amb distance today with increased c/o fatigue.  "I feel drained today" reported pt.  "I don't know why". Heavy lean/support through walker.  Unable to progress to a cane today. Pt reported he is "eating more real food" but seems "slow going".   Hoping pt can D/C to Inpt Rehab at [facility].   Recommendations for follow up therapy are one component of a multi-disciplinary discharge planning process, led by the attending physician.  Recommendations may be updated based on patient status, additional functional criteria and insurance authorization.  Follow Up Recommendations  Acute inpatient rehab (3hours/day)     Assistance Recommended at Discharge Intermittent Supervision/Assistance  Patient can return home with the following A little help with walking and/or transfers;A little help with bathing/dressing/bathroom;Assistance with cooking/housework;Help with stairs or ramp for entrance;Assist for transportation   Equipment Recommendations  Rolling walker (2 wheels)    Recommendations for Other Services Rehab consult     Precautions / Restrictions Precautions Precautions: Fall Precaution Comments: abdominal binder in place Restrictions Weight Bearing Restrictions: No      Mobility  Bed Mobility               General bed mobility comments: OOB in recliner    Transfers Overall transfer level: Needs assistance Equipment used: Rolling walker (2 wheels) Transfers: Sit to/from Stand Sit to Stand: Supervision, Min guard           General transfer comment: good use of B UE's to steady self, just requires increased time due to back and ABD pain    Ambulation/Gait Ambulation/Gait assistance: Supervision, Min guard Gait Distance (Feet): 115 Feet Assistive device: Rolling walker (2 wheels) Gait Pattern/deviations: Step-through pattern, Shuffle, Trunk flexed Gait velocity: decreased     General Gait Details: decreased amb distance today with increased c/o fatigue.  "I feel drained today" reported pt.  "I don't know why".   Stairs             Wheelchair Mobility    Modified Rankin (Stroke Patients Only)       Balance                                            Cognition Arousal/Alertness: Awake/alert Behavior During Therapy: WFL for tasks assessed/performed Overall Cognitive Status: Within Functional Limits for tasks assessed                                 General Comments: AxO x [place] and [employer]        Exercises  General Comments        Pertinent Vitals/Pain Pain Assessment Pain Assessment: Faces Faces Pain Scale: Hurts a little bit Pain Location: back "stiff" and ABD "cramping" Pain Descriptors / Indicators: Sore, Grimacing Pain Intervention(s): Monitored during session    Home Living                          Prior Function            PT Goals (current goals can now be found in the care plan section) Progress towards PT goals: Progressing toward goals    Frequency    Min 3X/week      PT Plan Current plan remains appropriate    Co-evaluation              AM-PAC PT "6  Clicks" Mobility   Outcome Measure  Help needed turning from your back to your side while in a flat bed without using bedrails?: A Little Help needed moving from lying on your back to sitting on the side of a flat bed without using bedrails?: A Little Help needed moving to and from a bed to a chair (including a wheelchair)?: A Little Help needed standing up from a chair using your arms (e.g., wheelchair or bedside chair)?: A Little Help needed to walk in hospital room?: A Little Help needed climbing 3-5 steps with a railing? : A Lot 6 Click Score: 17    End of Session Equipment Utilized During Treatment: Gait belt Activity Tolerance: Patient limited by fatigue Patient left: in chair;with call bell/phone within reach;with chair alarm set Nurse Communication: Mobility status PT Visit Diagnosis: Unsteadiness on feet (R26.81)     Time: 7209-4709 PT Time Calculation (min) (ACUTE ONLY): 18 min  Charges:  $Gait Training: 8-22 mins                     Rica Koyanagi  PTA [place] [facility] M-F          [phone] Weekend pager [phone]

## 2022-01-18 LAB — BASIC METABOLIC PANEL
Anion gap: 5 (ref 5–15)
BUN: 16 mg/dL (ref 6–20)
CO2: 27 mmol/L (ref 22–32)
Calcium: 8.7 mg/dL — ABNORMAL LOW (ref 8.9–10.3)
Chloride: 100 mmol/L (ref 98–111)
Creatinine, Ser: 0.82 mg/dL (ref 0.61–1.24)
GFR, Estimated: >60 mL/min (ref >60)
Glucose, Bld: 113 mg/dL — ABNORMAL HIGH (ref 70–99)
Potassium: 4.1 mmol/L (ref 3.5–5.1)
Sodium: 132 mmol/L — ABNORMAL LOW (ref 135–145)

## 2022-01-18 LAB — PHOSPHORUS: Phosphorus: 4.4 mg/dL (ref 2.5–4.6)

## 2022-01-18 LAB — GLUCOSE, CAPILLARY
Glucose-Capillary: 105 mg/dL — ABNORMAL HIGH (ref 70–99)
Glucose-Capillary: 107 mg/dL — ABNORMAL HIGH (ref 70–99)
Glucose-Capillary: 115 mg/dL — ABNORMAL HIGH (ref 70–99)
Glucose-Capillary: 95 mg/dL (ref 70–99)
Glucose-Capillary: 95 mg/dL (ref 70–99)

## 2022-01-18 LAB — MAGNESIUM: Magnesium: 2 mg/dL (ref 1.7–2.4)

## 2022-01-18 MED ORDER — POLYETHYLENE GLYCOL 3350 17 G PO PACK
34.0000 g | PACK | Freq: Two times a day (BID) | ORAL | Status: DC
  Administered 2022-01-18: 34 g via ORAL
  Filled 2022-01-18 (×2): qty 2

## 2022-01-18 MED ORDER — METOCLOPRAMIDE HCL 5 MG PO TABS
10.0000 mg | ORAL_TABLET | Freq: Three times a day (TID) | ORAL | Status: DC
  Administered 2022-01-18 – 2022-01-21 (×10): 10 mg via ORAL
  Filled 2022-01-18 (×10): qty 2

## 2022-01-18 MED ORDER — BOOST / RESOURCE BREEZE PO LIQD CUSTOM
1.0000 | Freq: Three times a day (TID) | ORAL | Status: DC
  Administered 2022-01-18 – 2022-01-19 (×4): 1 via ORAL

## 2022-01-18 NOTE — Progress Notes (Signed)
Progress Note  22 Days Post-Op  Subjective: Pt reports no flatus or BM this AM but denies nausea or worsening distention. No abdominal pain. Mobility limited by dizziness yesterday but aware he needs to mobilize more. Concerned he may be lactose intolerant. Asking how close he is to maybe going home. Wanting food from outside the hospital.   Objective: Vital signs in last 24 hours: Temp:  [97.4 F (36.3 C)-98.6 F (37 C)] 98.2 F (36.8 C) (01/02 0602) Pulse Rate:  [96-103] 100 (01/02 0602) Resp:  [16-18] 18 (01/02 0602) BP: (112-128)/(74-93) 112/74 (01/02 0602) SpO2:  [97 %-99 %] 97 % (01/02 0602) Weight:  [107.6 kg] 107.6 kg (01/02 0602) Last BM Date : 01/16/22  Intake/Output from previous day: 01/01 0701 - 01/02 0700 In: 3360.2 [P.O.:1920; I.V.:1140.2; IV Piggyback:300.1] Out: 2300 [Urine:2300] Intake/Output this shift: No intake/output data recorded.  PE: General: pleasant, WD, WN male who is laying in bed in NAD Heart: regular, rate, and rhythm.   Lungs:  Respiratory effort nonlabored Abd: soft, NT, mild distention, midline wound with healthy granulation tissue  Psych: A&Ox3 with a flat affect.    Lab Results:  No results for input(s): "WBC", "HGB", "HCT", "PLT" in the last 72 hours. BMET Recent Labs    01/17/22 0209 01/18/22 0441  NA 134* 132*  K 4.1 4.1  CL 102 100  CO2 26 27  GLUCOSE 105* 113*  BUN 17 16  CREATININE 0.81 0.82  CALCIUM 8.7* 8.7*   PT/INR No results for input(s): "LABPROT", "INR" in the last 72 hours. CMP     Component Value Date/Time   NA 132 (L) 01/18/2022 0441   K 4.1 01/18/2022 0441   CL 100 01/18/2022 0441   CO2 27 01/18/2022 0441   GLUCOSE 113 (H) 01/18/2022 0441   BUN 16 01/18/2022 0441   CREATININE 0.82 01/18/2022 0441   CALCIUM 8.7 (L) 01/18/2022 0441   PROT 6.1 (L) 01/17/2022 0209   ALBUMIN 2.9 (L) 01/17/2022 0209   AST 29 01/17/2022 0209   ALT 64 (H) 01/17/2022 0209   ALKPHOS 91 01/17/2022 0209   BILITOT 0.8  01/17/2022 0209   GFRNONAA >60 01/18/2022 0441   Lipase  No results found for: "LIPASE"     Studies/Results: No results found.  Anti-infectives: Anti-infectives (From admission, onward)    Start     Dose/Rate Route Frequency Ordered Stop   01/11/22 1400  erythromycin 250 mg in sodium chloride 0.9 % 100 mL IVPB        250 mg 100 mL/hr over 60 Minutes Intravenous Every 8 hours 01/11/22 1058     12/27/21 2130  ceFAZolin (ANCEF) IVPB 2g/100 mL premix        2 g 200 mL/hr over 30 Minutes Intravenous  Once 12/27/21 2039 12/27/21 2138   12/23/21 1400  cefoTEtan (CEFOTAN) 2 g in sodium chloride 0.9 % 100 mL IVPB        2 g 200 mL/hr over 30 Minutes Intravenous 60 min pre-op 12/23/21 1040 12/23/21 1444        Assessment/Plan  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 Thermon Leyland  POD 22 s/p exploratory laparotomy placement of retention sutures for fascial dehiscence Dr. 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 On scheduled erythromycin, reglan. -  afebrile, labs stable  - CT 12/18 with fluid collection in pelvis not definitively abscess. Pericolonic stranding of the splenic flexure and SB mesentery.  Repeat CT 12/24 without significant fluid collection/mostly resolved; persistent distention of colon c/w adynamic ileus, no obstruction - repeat ct with rectal contrast 12/29 shows small bowel and colon with fluid and air indicative of ileus anastomosis is patent with no issues - ate full lunch entree yesterday, try breeze protein supplement as well - continue erythromycin, transition to PO reglan - stop TPN after current dose - needs to mobilize more - Wound care  FEN: stop TPN, reg diet, bowel regimen VTE: LMWH ID: none  currently   LOS: 27 days    Norm Parcel, [facility] 01/18/2022, 8:42 AM Please see Amion for pager number during day hours 7:00am-4:30pm

## 2022-01-18 NOTE — Progress Notes (Signed)
Inpatient Rehab Admissions Coordinator:   Consult received and chart reviewed.  Pt making excellent progress with therapy, currently ambulating 500' with RW.  He is mobilizing too well to warrant a CIR admission at this time.  Would recommend f/u at a lower level of care.  Will sign off for CIR and let TOC know.   Shann Medal, PT, DPT Admissions Coordinator [phone] 01/18/22  3:27 PM

## 2022-01-18 NOTE — Progress Notes (Signed)
Nutrition Follow-up  DOCUMENTATION CODES:   Not applicable  INTERVENTION:  - Per surgery, plan to stop TPN this evening.   - Continue Regular diet as medically appropriate.  - Discontinued supplements per patient request.   - Encourage intake of small and frequent meals and to increase intake as tolerated.   - Monitor weight trends.    NUTRITION DIAGNOSIS:   Inadequate oral intake related to altered GI function (ileus) as evidenced by NPO status, other (comment) (starting TPN). *improving  GOAL:   Patient will meet greater than or equal to 90% of their needs *progressing  MONITOR:   Labs, Weight trends, Diet advancement  REASON FOR ASSESSMENT:   Consult Calorie Count  ASSESSMENT:   42 y.o. 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  12/5: admitted 12/6: flex sigmoidoscopy, CLD 12/7: NPO for sigmoid colectomy and mobilization of splenic flexure, ->CLD 12/11: FLD -> NPO for ex lap, NGT placed 12/12: PICC placed, TPN initiated 12/13: NGT out 12/14: NGT replaced 12/15: TPN advanced to goal 12/22: NGT removed 12/25: Diet advanced to Soft 12/26: Calorie count starting 12/28: CC complete, patient eating very little, plan to continue goal TPN 12/31 BM 1/2 Diet advanced to Regular, plan to discontinue TPN  Patient reports his nausea is much improved and his appetite is finally coming back. He has been taking more in over the past few days. Documented to be consuming 0-75% of meals over the past 5 days, mostly 50-75% the past 3 days.  Patient does not like [employer] or [employer] ordered, will discontinue per pt request.  Patient has now tried [employer], [employer], [employer], [employer], and [employer] and not enjoyed any.  Patient shares he is excited to finally be on a regular diet. Encouraged patient to try new solid foods slowly and to try and avoid greasy foods to start to assess  tolerance. RD ordered patient his desired lunch tray.  He is eager to be finally coming off TPN and get out of the hospital soon.  Of note, patient's weight has downtrended since admission. Will monitor closely.   Medications reviewed and include: Insulin, Reglan, Miralax, MVI, Senokot  Labs reviewed:  Na 132   Diet Order:   Diet Order             Diet regular Room service appropriate? Yes; Fluid consistency: Thin  Diet effective now                   EDUCATION NEEDS:  Education needs have been addressed  Skin:  Skin Assessment: Reviewed RN Assessment Skin Integrity Issues:: Incisions Incisions: Abdomen  Last BM:  12/31  Height:  Ht Readings from Last 1 Encounters:  12/23/21 6\' 1"  (1.854 m)   Weight:  Wt Readings from Last 1 Encounters:  01/18/22 107.6 kg   Ideal Body Weight:  83.6 kg  BMI:  Body mass index is 31.3 kg/m.  Estimated Nutritional Needs:  Kcal:  4315-4008 kcals Protein:  140-170 grams Fluid:  >/= 2.1L    Samson Frederic RD, LDN For contact information, refer to [facility].

## 2022-01-18 NOTE — Progress Notes (Signed)
Physical Therapy Treatment Patient Details Name: [name] MRN: [id number] DOB: 10/06/1980 Today's Date: 01/18/2022   History of Present Illness Pt admitted from home 2* syncope causes by sigmoid volvulus.  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  Of note, pt experienced episode of syncope in hospital while in shower 01/10/22    PT Comments    General Comments: Ax[employer] x [employer].  "Feeling better" today.  First day on a regular diet.  Pt excited and "for the first time I have food cravings".  Pt reported he has been amb to bathroom "just holding to the IV pole" vs walker.  Assisted with amb in hallway went well.  General Gait Details: tolerated an increased distance amb full unit without any rest break and less lean on walker.  "feeling better" today. Awaiting Inpt Rehab screening.   Recommendations for follow up therapy are one component of a multi-disciplinary discharge planning process, led by the attending physician.  Recommendations may be updated based on patient status, additional functional criteria and insurance authorization.  Follow Up Recommendations  Acute inpatient rehab (3hours/day)     Assistance Recommended at Discharge Intermittent Supervision/Assistance  Patient can return home with the following A little help with walking and/or transfers;A little help with bathing/dressing/bathroom;Assistance with cooking/housework;Help with stairs or ramp for entrance;Assist for transportation   Equipment Recommendations  Rolling walker (2 wheels)    Recommendations for Other Services Rehab consult     Precautions / Restrictions Precautions Precautions: Fall Precaution Comments: abdominal binder in place Restrictions Weight Bearing Restrictions: No     Mobility  Bed Mobility               General bed mobility comments: OOB in recliner     Transfers Overall transfer level: Needs assistance Equipment used: Rolling walker (2 wheels) Transfers: Sit to/from Stand Sit to Stand: Supervision           General transfer comment: good use of B UE's to steady self    Ambulation/Gait Ambulation/Gait assistance: Supervision, Min guard Gait Distance (Feet): 500 Feet Assistive device: Rolling walker (2 wheels) Gait Pattern/deviations: Step-through pattern, Shuffle, Trunk flexed Gait velocity: decreased     General Gait Details: tolerated an increased distance amb full unit without any rest break and less lean on walker.  "feeling better" today.   Stairs             Wheelchair Mobility    Modified Rankin (Stroke Patients Only)       Balance                                            Cognition Arousal/Alertness: Awake/alert Behavior During Therapy: WFL for tasks assessed/performed Overall Cognitive Status: Within Functional Limits for tasks assessed                                 General Comments: Ax[employer] x [employer]        Exercises      General Comments        Pertinent Vitals/Pain Pain Assessment Pain Assessment: No/denies pain    Home Living  Prior Function            PT Goals (current goals can now be found in the care plan section) Progress towards PT goals: Progressing toward goals    Frequency    Min 3X/week      PT Plan Current plan remains appropriate    Co-evaluation              AM-PAC PT "6 Clicks" Mobility   Outcome Measure  Help needed turning from your back to your side while in a flat bed without using bedrails?: None Help needed moving from lying on your back to sitting on the side of a flat bed without using bedrails?: None Help needed moving to and from a bed to a chair (including a wheelchair)?: None Help needed  standing up from a chair using your arms (e.g., wheelchair or bedside chair)?: None Help needed to walk in hospital room?: A Little Help needed climbing 3-5 steps with a railing? : A Little 6 Click Score: 22    End of Session Equipment Utilized During Treatment: Gait belt Activity Tolerance: Patient tolerated treatment well Patient left: in chair;with call bell/phone within reach;with chair alarm set Nurse Communication: Mobility status PT Visit Diagnosis: Unsteadiness on feet (R26.81)     Time: 0272-5366 PT Time Calculation (min) (ACUTE ONLY): 15 min  Charges:  $Gait Training: 8-22 mins                     Rica Koyanagi  PTA [place] Office M-F          [phone] Weekend pager [phone]

## 2022-01-18 NOTE — Plan of Care (Signed)
  Problem: Clinical Measurements: Goal: Ability to maintain clinical measurements within normal limits will improve Outcome: Progressing   Problem: Activity: Goal: Ability to tolerate increased activity will improve Outcome: Progressing   Problem: Nutritional: Goal: Will attain and maintain optimal nutritional status will improve Outcome: Progressing

## 2022-01-18 NOTE — Progress Notes (Signed)
Occupational Therapy Treatment Patient Details Name: Nathaniel Hanson MRN: [id number] DOB: June 18, 1980 Today's Date: 01/18/2022   History of present illness Pt is a 42 year old male who admitted from home 2* syncope causes by sigmoid volvulus.  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  Of note, pt experienced episode of syncope in hospital while in shower 01/10/22   OT comments  Patient was noted to make improvements towards goals. Patient was able to stand at sink to participate in grooming tasks for over 7 mins with occasional UE support. Patient was noted to have reduces assistance for transfers as well. Patient's discharge plan remains appropriate at this time. OT will continue to follow acutely.     Recommendations for follow up therapy are one component of a multi-disciplinary discharge planning process, led by the attending physician.  Recommendations may be updated based on patient status, additional functional criteria and insurance authorization.    Follow Up Recommendations  No OT follow up     Assistance Recommended at Discharge PRN  Patient can return home with the following  Assistance with cooking/housework   Equipment Recommendations  None recommended by OT    Recommendations for Other Services      Precautions / Restrictions Precautions Precautions: Fall Precaution Comments: abdominal binder in place Restrictions Weight Bearing Restrictions: No       Mobility Bed Mobility               General bed mobility comments: patient was up in recliner and returned to the same.       Balance Overall balance assessment: Mild deficits observed, not formally tested             ADL either performed or assessed with clinical judgement   ADL Overall ADL's : Needs assistance/impaired     Grooming: Standing;Wash/dry face;Wash/dry hands;Min guard Grooming Details (indicate cue type and reason): at sink with RW Upper Body  Bathing: Standing;Min guard Upper Body Bathing Details (indicate cue type and reason): at sink     Upper Body Dressing : Set up;Sitting       Toilet Transfer: Min guard;Ambulation;Rolling walker (2 wheels) Toilet Transfer Details (indicate cue type and reason): to commode and to stand to void. Toileting- Clothing Manipulation and Hygiene: Sitting/lateral lean;Supervision/safety Toileting - Clothing Manipulation Details (indicate cue type and reason): with increased time standing at sink              Cognition Arousal/Alertness: Awake/alert Behavior During Therapy: WFL for tasks assessed/performed Overall Cognitive Status: Within Functional Limits for tasks assessed                                                     Pertinent Vitals/ Pain       Pain Assessment Pain Assessment: No/denies pain   Frequency  Min 2X/week        Progress Toward Goals  OT Goals(current goals can now be found in the care plan section)  Progress towards OT goals: Progressing toward goals     Plan Discharge plan remains appropriate       AM-PAC OT "6 Clicks" Daily Activity     Outcome Measure   Help from another person eating meals?: None Help from another person taking care of personal grooming?: A Little Help from another person toileting,  which includes using toliet, bedpan, or urinal?: A Little Help from another person bathing (including washing, rinsing, drying)?: A Little Help from another person to put on and taking off regular upper body clothing?: A Little Help from another person to put on and taking off regular lower body clothing?: A Little 6 Click Score: 19    End of Session Equipment Utilized During Treatment: Rolling walker (2 wheels)  OT Visit Diagnosis: Muscle weakness (generalized) (M62.81)   Activity Tolerance Patient limited by fatigue   Patient Left in chair;with call bell/phone within reach   Nurse Communication Mobility status         Time: 1610-9604 OT Time Calculation (min): 27 min  Charges: OT General Charges $OT Visit: 1 Visit OT Treatments $Self Care/Home Management : 23-37 mins  Rennie Plowman, MS Acute Rehabilitation Department Office# [phone]   Willa Rough 01/18/2022, 4:05 PM

## 2022-01-18 NOTE — Progress Notes (Signed)
PHARMACY - TOTAL PARENTERAL NUTRITION CONSULT NOTE   Indication: Prolonged ileus  Patient Measurements: Height: _0  (185.4 cm) Weight: 107.6 kg (237 lb 3.4 oz) IBW/kg (Calculated) : 79.9 TPN AdjBW (KG): 88.9 Body mass index is 31.3 kg/m.  Assessment: Patient is a 42 y.o M who presented to the ED on 12/21/21 for evaluation after he experienced syncope and blacked out at home.  Abdominal CT on 12/21/21 showed findings consistent with sigmoid volvulus.He underwent sigmoidoscopy on 12/22/21 with decompression of the volvulus. However, he still had significant sigmoid colon dilation s/p procedure and subsequently underwent sigmoid colectomy on 12/23/21. On 12/11, he had bleeding from surgical incision site as well as n/v and was found to have "fascial dehiscence and near evisceration of his bowel."  He was taken to the OR emergently on 12/27/21 for  exp lap with irrigation of the midline incision.  Abd x-ray on 12/27/21 showed findings consistent with post-op ileus. Fascial dehiscence. Pharmacy has been consulted to start TPN.  Recent Labs    01/17/22 0209 01/18/22 0441  NA 134* 132*  K 4.1 4.1  CL 102 100  CO2 26 27  GLUCOSE 105* 113*  BUN 17 16  CREATININE 0.81 0.82  CALCIUM 8.7* 8.7*  PHOS 4.8* 4.4  MG 2.0 2.0  ALBUMIN 2.9*  --   ALKPHOS 91  --   AST 29  --   ALT 64*  --   BILITOT 0.8  --     Glucose / Insulin: no hx DM - on mSSI q4h (4 units insulin given in the past 24 hrs) - goal cbgs <150: 112-130  over previous 24hrs Electrolytes: Na is slightly low at 134, Phosphorus high at 4.8, other are WNL including CorrCa   Renal: Scr wnl, BUN WNL.  Hepatic:  - AST, Tbili and Alk Phos WNL, ALT sl high - albumin 2.9 - TG 84 Intake / Output; MIVF: NS at 20 ml/hr  - I/O:   (+2.22 L) - UOP: 750 ml, likely not fully charted  - BM charted on 12/31 GI Imaging: - 12/5 abd CT: Findings compatible with sigmoid volvulus. - 12/11 abd xray: Diffuse gaseous distention of bowel, compatible  with postoperative ileus. - 12/14 abd xray: Persistent diffuse gaseous distension of bowel, compatible with ileus, with increased small bowel distension in comparison to prior. - 12/18 CT: fluid collection in pelvis not definitively abscess. Pericolonic stranding of the splenic flexure and SB mesentery.  - 12/21 abd XRAY:  left basilar atelectasis with superimposed aerated basilar lung. No evidence of bowel obstruction. Gaseous distention of the colon  - 12/24 for CT abd/pelvis: no abscess, Free fluid in peritoneal cavity nearly completely resolved  - 12/29: Small bowel and colon are dilated with fluid and air, indicative of an ileus. Sigmoid anastomosis without evidence of a contrast leak on delayed imaging. Scattered ascites.  GI Surgeries / Procedures:  - 12/6 Flexible Sigmoidoscopy: Volvulus. Successful complete decompression achieved - 12/7: sigmoid colectomy  - 12/11: exp lap with irrigation of midline incision - 12/11: NGT placed >> came out on 12/13, replaced 12/14, removed 12/22  Central access:  PICC placement  TPN start date: 12/28/21 PICC placement  Nutritional Goals: Goal TPN rate is 105 mL/hr (provides 143 g of protein and 2489 kcals per day)  RD Assessment: Refeeding syndrome s/p thiamine 100 mg for 5 days; completed 12/12-12/17. Estimated Needs Total Energy Estimated Needs: 0177-9390 kcals Total Protein Estimated Needs: 140-170 grams Total Fluid Estimated Needs: >/= 2.1L  Current Nutrition:  Soft diet (started 12/25); Nutritional supplements per RD 12/26-12/27 Calorie count shows minimal intake.    Plan:  -Note consult to stop TPN -Continue TPN through this evening, then discontinue -No further monitoring by pharmacy  Tawnya Crook, PharmD, BCPS Clinical Pharmacist 01/18/2022 9:01 AM

## 2022-01-18 NOTE — Progress Notes (Signed)
  Progress Note   Patient: Nathaniel Hanson WPV:[id number] DOB: February 23, 1980 DOA: 12/21/2021     27 DOS: the patient was seen and examined on 01/18/2022   Brief hospital course: 42 year old male who presented to the hospital after sigmoid volvulus which caused syncope. He underwent a flex sigmoidoscopy on 12/6 without success. On 12/7, the patient underwent a sigmoid colectomy and mobilization of the splenic flexure.  His postop course has been complicated by a persistent ileus.  He was noted to have fascial dehiscence and return to the OR on 12/11 for placement of retention sutures.  Assessment and Plan: Principal Problem:  Sigmoid volvulus    Colonic obstruction from adhesions s/p colectomy 12/23/2021 - Continue management per general surgery - Had been having issues with poor PO intake. Had been TPN dependent - PO intake now seems like is improving. Surgery recs to stop TPN and advance to regular diet    Active Problems: Mildly elevated LFTs  -he has no prior history of elevation - now improved   Glucose intolerance - A1c is 5.7 - Cont diet/lifestyle modification     Syncope -Cause unclear but suspected to be vasovagal vs deconditioning from prolonged laying in bed this visit -PT consulted, recs for CIR. CIR consulted   Hyponatremia, sinus tachycardia and dehydration -stable  Obesity Body mass index is 32.05 kg/m.      Subjective: In better spirits today. Eager to start eating regular diet  Physical Exam: Vitals:   01/17/22 0619 01/17/22 1356 01/17/22 2043 01/18/22 0602  BP: 112/73 125/82 (!) 128/93 112/74  Pulse: (!) 105 (!) 103 96 100  Resp: 17 16 16 18   Temp: 98.1 F (36.7 C) (!) 97.4 F (36.3 C) 98.6 F (37 C) 98.2 F (36.8 C)  TempSrc: Oral     SpO2: 97% 99% 99% 97%  Weight:    107.6 kg  Height:       General exam: Conversant, in no acute distress Respiratory system: normal chest rise, clear, no audible wheezing Cardiovascular system: regular rhythm,  s1-s2 Gastrointestinal system: Nondistended, nontender, pos BS Central nervous system: No seizures, no tremors Extremities: No cyanosis, no joint deformities Skin: No rashes, no pallor Psychiatry: Affect normal // no auditory hallucinations   Data Reviewed:  Labs reviewed: Na 132, K 4.1, Cr 0.82  Family Communication: Pt in room, family not at bedside  Disposition: Status is: Inpatient Remains inpatient appropriate because: Severity of illness  Planned Discharge Destination: Rehab    Author: Marylu Lund, MD 01/18/2022 2:09 PM  For on call review [email or web address].

## 2022-01-18 NOTE — Plan of Care (Signed)
  Problem: Activity: Goal: Risk for activity intolerance will decrease Outcome: Progressing   

## 2022-01-19 LAB — GLUCOSE, CAPILLARY
Glucose-Capillary: 109 mg/dL — ABNORMAL HIGH (ref 70–99)
Glucose-Capillary: 130 mg/dL — ABNORMAL HIGH (ref 70–99)
Glucose-Capillary: 90 mg/dL (ref 70–99)
Glucose-Capillary: 98 mg/dL (ref 70–99)

## 2022-01-19 MED ORDER — LINACLOTIDE 145 MCG PO CAPS
290.0000 ug | ORAL_CAPSULE | Freq: Every day | ORAL | Status: DC
  Administered 2022-01-20 – 2022-01-21 (×2): 290 ug via ORAL
  Filled 2022-01-19 (×2): qty 2

## 2022-01-19 MED ORDER — ERYTHROMYCIN BASE 250 MG PO TABS
250.0000 mg | ORAL_TABLET | Freq: Three times a day (TID) | ORAL | Status: DC
  Administered 2022-01-19: 250 mg via ORAL
  Filled 2022-01-19: qty 1

## 2022-01-19 MED ORDER — MAGNESIUM HYDROXIDE 400 MG/5ML PO SUSP
30.0000 mL | Freq: Every day | ORAL | Status: DC
  Administered 2022-01-19 – 2022-01-21 (×3): 30 mL via ORAL
  Filled 2022-01-19 (×3): qty 30

## 2022-01-19 MED ORDER — SENNA 8.6 MG PO TABS
2.0000 | ORAL_TABLET | Freq: Every day | ORAL | Status: DC
  Administered 2022-01-19 – 2022-01-21 (×3): 17.2 mg via ORAL
  Filled 2022-01-19 (×3): qty 2

## 2022-01-19 MED ORDER — CALCIUM POLYCARBOPHIL 625 MG PO TABS
625.0000 mg | ORAL_TABLET | Freq: Two times a day (BID) | ORAL | Status: DC
  Administered 2022-01-19 – 2022-01-21 (×5): 625 mg via ORAL
  Filled 2022-01-19 (×7): qty 1

## 2022-01-19 NOTE — Progress Notes (Signed)
Writer in room to do dressing, patient wanted it be done after lunch.

## 2022-01-19 NOTE — TOC Progression Note (Addendum)
Transition of Care [facility]) - Progression Note    Patient Details  Name: Nathaniel Hanson MRN: [id number] Date of Birth: 1980/01/31  Transition of Care [facility]) CM/SW Contact  Lennart Pall, LCSW Phone Number: 01/19/2022, 12:44 PM  Clinical Narrative:    Met with pt today in anticipation of dc soon.  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  Explained to him that [facility] has been unable to secure any HHRN coverage under charity and he is working with nursing on learning his own wound care.  His mobility is very good - amb 700' today!  Pt confirms he is uninsured but does have a PCP with Dr. Virgina Jock and wants to remain with that practice.  I did review local [facility] clinics should he want to change PCP.  Will likely need MATCH for med assistance at dc. TOC continues to follow.  ADDENDUM:   Pt asking for this CSW to speak with him again with his aunt on phone.  Reviewed what has been discussed with patient earlier and aunt very supportive.  She is asking if Medicaid app can be started.  Explained that this is initiated by [employer] but will check to see if his admission has been screened for eligibility.  MD has informed pt and aunt that he anticipates dc in 24-48 hours.     Barriers to Discharge: Continued Medical Work up, Inadequate or no insurance  Expected Discharge Plan and Services                                               Social Determinants of Health (SDOH) Interventions [place]: No Food Insecurity (12/22/2021)  Housing: Low Risk  (12/22/2021)  Transportation Needs: No Transportation Needs (12/22/2021)  Utilities: Not At Risk (12/22/2021)  Tobacco Use: Low Risk  (01/03/2022)    Readmission Risk Interventions    01/07/2022   12:34 PM 12/23/2021    8:52 AM  Readmission Risk Prevention Plan  Post Dischage Appt Complete Complete  Medication Screening Complete Complete   Transportation Screening Complete Complete

## 2022-01-19 NOTE — Progress Notes (Signed)
Mobility Specialist - Progress Note   01/19/22 0958  Mobility  Activity Ambulated with assistance in hallway  Level of Assistance Standby assist, set-up cues, supervision of patient - no hands on  Assistive Device Front wheel walker  Distance Ambulated (ft) 700 ft  Range of Motion/Exercises Active  Activity Response Tolerated well  Mobility Referral Yes  $Mobility charge 1 Mobility   Pt was found in bed and agreeable to ambulate. Started having some pain towards EOS and at EOS returned to recliner chair with necessities in reach. RN notified of session.  Ferd Hibbs Mobility Specialist

## 2022-01-19 NOTE — Progress Notes (Signed)
Patient rather watch staff change abdominal dressing today. Patient states that  he can do it and will try it on his own tomorrow with staff assistance.

## 2022-01-19 NOTE — Progress Notes (Signed)
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 12/5 seen by GI General surgery, decompressed with sigmoidoscopy found to have relatively normal colon 12/7 sigmoid colectomy performed by general surgery 12/11 developed nausea vomiting bleeding from wound as well as disruption of midline sutures and underwent ex lap Developed persistent ileus there was some consideration for the patient also having intra-abdominal collection but this proved unfounded   Hospital-Problem based course  Sigmoid volvulus status post resection, revision of surgery as above -As per general surgery, wound seems to be healing slowly - Clear from their perspective for discharge once able  Ileus as well as slow bowel transit -Discontinued erythromycin - Current meds Dulcolax 10 daily Linzess to 90 mcg Reglan 10 3 times daily FiberCon 625 twice daily and senna 17 daily also will try milk of magnesium - Appreciate follow-up by gastroenterology  Glucose intolerance A1c 5.7 - Outpatient counseling  Syncope vasovagal suspected versus deconditioning - CR consulted and declined -He is walking 700 feet  Sinus tachycardia on admission with volume depletion - Stable  Mildly elevated LFTs - Likely secondary to fluid shifts etc. - Follow labs 1 more time    DVT prophylaxis: Lovenox Code Status: Full code confirmed  Family Communication: briefly discussed with patient's aunt on the phone Disposition:  Status is: Inpatient Remains inpatient appropriate because:   Await stability on diet and passage of stool regularly    Subjective: Coherent passing flatus ambulatory no chest pain tolerating diet had 2 meals today  Objective: Vitals:   01/18/22 0602 01/18/22 1440 01/18/22 2126  01/19/22 0446  BP: 112/74 118/85 (!) 123/91 (!) 96/54  Pulse: 100 100 (!) 104 100  Resp: 18 18 16 18   Temp: 98.2 F (36.8 C) 97.9 F (36.6 C) 99.3 F (37.4 C) 98 F (36.7 C)  TempSrc:  Oral Oral Oral  SpO2: 97% 95% 96% 94%  Weight: 107.6 kg     Height:        Intake/Output Summary (Last 24 hours) at 01/19/2022 0721 Last data filed at 01/19/2022 0600 Gross per 24 hour  Intake 3473.99 ml  Output 2175 ml  Net 1298.99 ml   Filed Weights   12/28/21 0915 01/11/22 0650 01/18/22 0602  Weight: 113.6 kg 110.2 kg 107.6 kg    Examination:  EOMI NCAT no focal deficit no icterus no pallor Thick neck Abdomen soft no rebound no guarding but slight distention No lower extremity edema S1-S2 no murmur ROM intact Power 5/5  Data Reviewed: personally reviewed   CBC    Component Value Date/Time   WBC 9.9 01/14/2022 1010   RBC 4.43 01/14/2022 1010   HGB 12.5 (L) 01/14/2022 1010   HCT 38.2 (L) 01/14/2022 1010   PLT 312 01/14/2022 1010   MCV 86.2 01/14/2022 1010   MCH 28.2 01/14/2022 1010   MCHC 32.7 01/14/2022 1010   RDW 14.1 01/14/2022 1010   LYMPHSABS 1.0 12/30/2021 1238   MONOABS 0.8 12/30/2021 1238   EOSABS 0.2 12/30/2021 1238   BASOSABS 0.0 12/30/2021 1238      Latest Ref Rng & Units 01/18/2022    4:41 AM 01/17/2022    2:09 AM 01/16/2022    2:11 AM  CMP  Glucose 70 - 99 mg/dL 113  105  105   BUN 6 - 20  mg/dL 16  17  14    Creatinine 0.61 - 1.24 mg/dL 0.82  0.81  0.76   Sodium 135 - 145 mmol/L 132  134  135   Potassium 3.5 - 5.1 mmol/L 4.1  4.1  3.8   Chloride 98 - 111 mmol/L 100  102  102   CO2 22 - 32 mmol/L 27  26  28    Calcium 8.9 - 10.3 mg/dL 8.7  8.7  8.7   Total Protein 6.5 - 8.1 g/dL  6.1    Total Bilirubin 0.3 - 1.2 mg/dL  0.8    Alkaline Phos 38 - 126 U/L  91    AST 15 - 41 U/L  29    ALT 0 - 44 U/L  64       Radiology Studies: No results found.   Scheduled Meds:  (feeding supplement) PROSource Plus  30 mL Oral TID BM   bisacodyl  10 mg Rectal Daily    Chlorhexidine Gluconate Cloth  6 each Topical Daily   enoxaparin (LOVENOX) injection  40 mg Subcutaneous Q24H   feeding supplement  1 Container Oral TID BM   insulin aspart  0-15 Units Subcutaneous Q6H   methocarbamol  500 mg Oral TID   metoCLOPramide  10 mg Oral TID AC   multivitamin with minerals  1 tablet Oral Daily   polyethylene glycol  34 g Oral BID   senna-docusate  1 tablet Oral QHS   sodium chloride flush  10-40 mL Intracatheter Q12H   sodium chloride flush  3 mL Intravenous Q12H   sorbitol, milk of mag, mineral oil, glycerin (SMOG) enema  200 mL Rectal Once   Continuous Infusions:  sodium chloride 10 mL/hr at 01/17/22 2237   erythromycin 250 mg (01/19/22 0536)   promethazine (PHENERGAN) injection (IM or IVPB) Stopped (01/14/22 2059)   sodium chloride       LOS: 28 days   Time spent: [place], MD [facility] To contact the attending provider between 7A-7P or the covering provider during after hours 7P-7A, please log into the web site [email or web address] and access using universal [place] password for that web site. If you do not have the password, please call the hospital operator.  01/19/2022, 7:21 AM

## 2022-01-19 NOTE — Plan of Care (Signed)
  Problem: Activity: Goal: Ability to tolerate increased activity will improve Outcome: Progressing   Problem: Skin Integrity: Goal: Will show signs of wound healing Outcome: Progressing   Problem: Nutritional: Goal: Will attain and maintain optimal nutritional status will improve Outcome: Progressing

## 2022-01-19 NOTE — Progress Notes (Addendum)
[facility] Progress Note  Mattix Imhof Ortez 42 y.o. October 17, 1980   Subjective: Sitting in bedside chair. Denies flatus or BMs. Denies abdominal pain.  Objective: Vital signs: Vitals:   01/18/22 2126 01/19/22 0446  BP: (!) 123/91 (!) 96/54  Pulse: (!) 104 100  Resp: 16 18  Temp: 99.3 F (37.4 C) 98 F (36.7 C)  SpO2: 96% 94%    Physical Exam: Gen: lethargic, no acute distress  HEENT: anicteric sclera CV: RRR Chest: CTA B Abd: abd binder in place, nontender, +BS Ext: no edema  Lab Results: Recent Labs    01/17/22 0209 01/18/22 0441  NA 134* 132*  K 4.1 4.1  CL 102 100  CO2 26 27  GLUCOSE 105* 113*  BUN 17 16  CREATININE 0.81 0.82  CALCIUM 8.7* 8.7*  MG 2.0 2.0  PHOS 4.8* 4.4   Recent Labs    01/17/22 0209  AST 29  ALT 64*  ALKPHOS 91  BILITOT 0.8  PROT 6.1*  ALBUMIN 2.9*   No results for input(s): "WBC", "NEUTROABS", "HGB", "HCT", "MCV", "PLT" in the last 72 hours.    Assessment/Plan: Sigmoid volvulus s/p sigmoid colectomy and subsequent fascial dehiscence correction who has a history of chronic dysmotility. Ileus on 12/29 CT with patent anastomosis. Called by surgery team for re-activation due to ongoing constipation postop. 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 Has chest pain with Miralax and he is unsure if it helps. No help with Reglan and Erythromycin. Discussed giving him half a gallon of Trilyte but he does not want to try the bowel prep so will give a trial of Linzess 290 mcg/day.  FiberCon and Senna ok as well. Would avoid enemas with recent surgery. Will follow.   [name] 01/19/2022, 2:28 PM  Questions please call [phone] ID: KORBEN CARCIONE, male   DOB: May 05, 1980, 42 y.o.   MRN: [id number]

## 2022-01-19 NOTE — Progress Notes (Signed)
[facility] Progress Note  23 Days Post-Op  Subjective: CC-  Doing well today. Abdominal pain well controlled. Denies n/v. Ate all of his breakfast. No flatus or BM but denies any worsening abdominal distension or pain. Miralax caused chest pain so he does not want to take this any more.  Objective: Vital signs in last 24 hours: Temp:  [97.9 F (36.6 C)-99.3 F (37.4 C)] 98 F (36.7 C) (01/03 0446) Pulse Rate:  [100-104] 100 (01/03 0446) Resp:  [16-18] 18 (01/03 0446) BP: (96-123)/(54-91) 96/54 (01/03 0446) SpO2:  [94 %-96 %] 94 % (01/03 0446) Last BM Date : 01/16/22  Intake/Output from previous day: 01/02 0701 - 01/03 0700 In: 3474 [P.O.:2220; I.V.:954; IV Piggyback:300] Out: 2175 [Urine:2175] Intake/Output this shift: No intake/output data recorded.  PE: General: Alert, NAD Heart: regular, rate, and rhythm.   Lungs:  Respiratory effort nonlabored on room air Abd: soft, NT, mild distention, midline wound with healthy granulation tissue  Psych: A&Ox3 with a flat affect.   Lab Results:  No results for input(s): "WBC", "HGB", "HCT", "PLT" in the last 72 hours. BMET Recent Labs    01/17/22 0209 01/18/22 0441  NA 134* 132*  K 4.1 4.1  CL 102 100  CO2 26 27  GLUCOSE 105* 113*  BUN 17 16  CREATININE 0.81 0.82  CALCIUM 8.7* 8.7*   PT/INR No results for input(s): "LABPROT", "INR" in the last 72 hours. CMP     Component Value Date/Time   NA 132 (L) 01/18/2022 0441   K 4.1 01/18/2022 0441   CL 100 01/18/2022 0441   CO2 27 01/18/2022 0441   GLUCOSE 113 (H) 01/18/2022 0441   BUN 16 01/18/2022 0441   CREATININE 0.82 01/18/2022 0441   CALCIUM 8.7 (L) 01/18/2022 0441   PROT 6.1 (L) 01/17/2022 0209   ALBUMIN 2.9 (L) 01/17/2022 0209   AST 29 01/17/2022 0209   ALT 64 (H) 01/17/2022 0209   ALKPHOS 91 01/17/2022 0209   BILITOT 0.8 01/17/2022 0209   GFRNONAA >60 01/18/2022 0441   Lipase  No results found for: "LIPASE"     Studies/Results: No  results found.  Anti-infectives: Anti-infectives (From admission, onward)    Start     Dose/Rate Route Frequency Ordered Stop   01/19/22 1400  erythromycin (E-MYCIN) tablet 250 mg        250 mg Oral Every 8 hours 01/19/22 0736     01/11/22 1400  erythromycin 250 mg in sodium chloride 0.9 % 100 mL IVPB  Status:  Discontinued        250 mg 100 mL/hr over 60 Minutes Intravenous Every 8 hours 01/11/22 1058 01/19/22 0736   12/27/21 2130  ceFAZolin (ANCEF) IVPB 2g/100 mL premix        2 g 200 mL/hr over 30 Minutes Intravenous  Once 12/27/21 2039 12/27/21 2138   12/23/21 1400  cefoTEtan (CEFOTAN) 2 g in sodium chloride 0.9 % 100 mL IVPB        2 g 200 mL/hr over 30 Minutes Intravenous 60 min pre-op 12/23/21 1040 12/23/21 1444        Assessment/Plan Sigmoid volvulus on admission imaging - operative findings of sigmoid obstruction due to adhesions -POD 27 s/p sigmoid colectomy, mobilization of splenic flexure Dr. Thermon Leyland  -POD 23 s/p exploratory laparotomy placement of retention sutures for fascial dehiscence Dr. 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 On scheduled erythromycin, reglan. - afebrile, labs stable  - CT 12/18 with fluid collection in pelvis not definitively abscess. Pericolonic stranding of the splenic flexure and SB mesentery.  Repeat CT 12/24 without significant fluid collection/mostly resolved; persistent distention of colon c/w adynamic ileus, no obstruction - repeat ct with rectal contrast 12/29 shows small bowel and colon with fluid and air indicative of ileus anastomosis is patent with no issues - Eating 75-100% of his meals and Boost and tolerating well, but still not having consistent bowel movements. - Continue PO erythromycin and reglan. Change miralax to milk of magnesia.  - Mobilize, continue therapies - denied by CIR because  he is doing too well. Hoping to stay on campus when he is discharged, there is a nurse there that can help some. He is going to practice doing his own dressing change today. Will ask PT to practice stairs today. Spoke with patient's aunt, Jackelyn Poling, on the phone while I was in the room.   FEN: reg diet, bowel regimen VTE: LMWH ID: none currently     LOS: 28 days    Wellington Hampshire, [facility] Surgery 01/19/2022, 9:44 AM Please see Amion for pager number during day hours 7:00am-4:30pm

## 2022-01-20 LAB — COMPREHENSIVE METABOLIC PANEL
ALT: 39 U/L (ref 0–44)
AST: 17 U/L (ref 15–41)
Albumin: 3.3 g/dL — ABNORMAL LOW (ref 3.5–5.0)
Alkaline Phosphatase: 107 U/L (ref 38–126)
Anion gap: 8 (ref 5–15)
BUN: 15 mg/dL (ref 6–20)
CO2: 26 mmol/L (ref 22–32)
Calcium: 9 mg/dL (ref 8.9–10.3)
Chloride: 101 mmol/L (ref 98–111)
Creatinine, Ser: 0.98 mg/dL (ref 0.61–1.24)
GFR, Estimated: >60 mL/min (ref >60)
Glucose, Bld: 100 mg/dL — ABNORMAL HIGH (ref 70–99)
Potassium: 4.2 mmol/L (ref 3.5–5.1)
Sodium: 135 mmol/L (ref 135–145)
Total Bilirubin: 0.9 mg/dL (ref 0.3–1.2)
Total Protein: 6.8 g/dL (ref 6.5–8.1)

## 2022-01-20 LAB — GLUCOSE, CAPILLARY
Glucose-Capillary: 115 mg/dL — ABNORMAL HIGH (ref 70–99)
Glucose-Capillary: 123 mg/dL — ABNORMAL HIGH (ref 70–99)
Glucose-Capillary: 87 mg/dL (ref 70–99)
Glucose-Capillary: 92 mg/dL (ref 70–99)

## 2022-01-20 MED ORDER — PEG 3350-KCL-NA BICARB-NACL 420 G PO SOLR
1000.0000 mL | Freq: Once | ORAL | Status: AC
  Administered 2022-01-20: 1000 mL via ORAL

## 2022-01-20 NOTE — Progress Notes (Signed)
Physical Therapy Treatment Patient Details Name: Nathaniel Hanson MRN: [id number] DOB: 07-Oct-1980 Today's Date: 01/20/2022   History of Present Illness Pt is a 42 year old male who admitted from home 2* syncope causes by sigmoid volvulus.  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   Assisted OOB to amb with a cane vs walker this session and practiced stairs.     Recommendations for follow up therapy are one component of a multi-disciplinary discharge planning process, led by the attending physician.  Recommendations may be updated based on patient status, additional functional criteria and insurance authorization.  Follow Up Recommendations  Home health PT     Assistance Recommended at Discharge Intermittent Supervision/Assistance  Patient can return home with the following A little help with walking and/or transfers;A little help with bathing/dressing/bathroom;Assistance with cooking/housework;Help with stairs or ramp for entrance;Assist for transportation   Equipment Recommendations  Cane    Recommendations for Other Services       Precautions / Restrictions Precautions Precautions: Fall Precaution Comments: abdominal binder in place Restrictions Weight Bearing Restrictions: No     Mobility  Bed Mobility Overal bed mobility: Needs Assistance Bed Mobility: Rolling, Sidelying to Sit Rolling: Supervision Sidelying to sit: Supervision       General bed mobility comments: self able using B UE's    Transfers Overall transfer level: Needs assistance Equipment used: None Transfers: Sit to/from Stand Sit to Stand: Supervision Stand pivot transfers: Supervision         General transfer comment:  good use of B UE's to steady self    Ambulation/Gait Ambulation/Gait assistance: Supervision, Min guard Gait Distance (Feet): 225 Feet Assistive device: Straight cane Gait Pattern/deviations: Step-through pattern, Shuffle, Trunk flexed Gait velocity: decreased     General Gait Details: decreased amb distance due to increased c/o fatigue/effort amb with SPC vs RW this session.  25% VC's on proper sequencing as well as occassional need to steady self on hallway rails.   Stairs Stairs: Yes Stairs assistance: Supervision, Min guard Stair Management: One rail Right Number of Stairs: 12 General stair comments: 25% VC's on safety as well as sequencing.  Pt self able up stairs using one rail and cane but coming down, pt was nervous so instructed to hold to rail with "both hands" and come down slightly sideways.   Wheelchair Mobility    Modified Rankin (Stroke Patients Only)       Balance                                            Cognition Arousal/Alertness: Awake/alert   Overall Cognitive Status: Within Functional Limits for tasks assessed                                 General Comments: AxO x [employer]        Exercises      General Comments  Pertinent Vitals/Pain Pain Assessment Pain Assessment: Faces Faces Pain Scale: Hurts a little bit Pain Location: back "stiff" and ABD "cramping" Pain Descriptors / Indicators: Sore, Grimacing Pain Intervention(s): Monitored during session, Repositioned    Home Living                          Prior Function            PT Goals (current goals can now be found in the care plan section) Progress towards PT goals: Progressing toward goals    Frequency    Min 3X/week      PT Plan Current plan remains appropriate    Co-evaluation              AM-PAC PT "6 Clicks" Mobility   Outcome  Measure  Help needed turning from your back to your side while in a flat bed without using bedrails?: None Help needed moving from lying on your back to sitting on the side of a flat bed without using bedrails?: None Help needed moving to and from a bed to a chair (including a wheelchair)?: None Help needed standing up from a chair using your arms (e.g., wheelchair or bedside chair)?: None Help needed to walk in hospital room?: A Little Help needed climbing 3-5 steps with a railing? : A Little 6 Click Score: 22    End of Session Equipment Utilized During Treatment: Gait belt Activity Tolerance: Patient tolerated treatment well Patient left: Other (comment) (in bathroom) Nurse Communication: Mobility status PT Visit Diagnosis: Unsteadiness on feet (R26.81)     Time: 8413-2440 PT Time Calculation (min) (ACUTE ONLY): 26 min  Charges:  $Gait Training: 8-22 mins $Therapeutic Activity: 8-22 mins                     Rica Koyanagi  PTA Acute  Rehabilitation Services Office M-F          [phone] Weekend pager [phone]

## 2022-01-20 NOTE — Progress Notes (Signed)
[facility] Progress Note  24 Days Post-Op  Subjective: CC-  Felt very full after eating 3 solid meals yesterday. Some nausea yesterday, no emesis. No flatus or BM. Feeling some bubbling in his abdomen.  Objective: Vital signs in last 24 hours: Temp:  [98.1 F (36.7 C)-98.4 F (36.9 C)] 98.4 F (36.9 C) (01/04 0530) Pulse Rate:  [92-109] 92 (01/04 0530) Resp:  [16-18] 18 (01/04 0530) BP: (114-132)/(82-87) 114/82 (01/04 0530) SpO2:  [97 %-98 %] 98 % (01/04 0530) Last BM Date : 01/16/22  Intake/Output from previous day: 01/03 0701 - 01/04 0700 In: 420 [P.O.:420] Out: 1150 [Urine:1150] Intake/Output this shift: No intake/output data recorded.  PE: General: Alert, NAD Lungs:  Respiratory effort nonlabored on room air Abd: soft, NT, nondistended, midline wound with healthy granulation tissue  Psych: A&Ox3 with a flat affect  Lab Results:  No results for input(s): "WBC", "HGB", "HCT", "PLT" in the last 72 hours. BMET Recent Labs    01/18/22 0441 01/20/22 0416  NA 132* 135  K 4.1 4.2  CL 100 101  CO2 27 26  GLUCOSE 113* 100*  BUN 16 15  CREATININE 0.82 0.98  CALCIUM 8.7* 9.0   PT/INR No results for input(s): "LABPROT", "INR" in the last 72 hours. CMP     Component Value Date/Time   NA 135 01/20/2022 0416   K 4.2 01/20/2022 0416   CL 101 01/20/2022 0416   CO2 26 01/20/2022 0416   GLUCOSE 100 (H) 01/20/2022 0416   BUN 15 01/20/2022 0416   CREATININE 0.98 01/20/2022 0416   CALCIUM 9.0 01/20/2022 0416   PROT 6.8 01/20/2022 0416   ALBUMIN 3.3 (L) 01/20/2022 0416   AST 17 01/20/2022 0416   ALT 39 01/20/2022 0416   ALKPHOS 107 01/20/2022 0416   BILITOT 0.9 01/20/2022 0416   GFRNONAA >60 01/20/2022 0416   Lipase  No results found for: "LIPASE"     Studies/Results: No results found.  Anti-infectives: Anti-infectives (From admission, onward)    Start     Dose/Rate Route Frequency Ordered Stop   01/19/22 1400  erythromycin (E-MYCIN) tablet  250 mg  Status:  Discontinued        250 mg Oral Every 8 hours 01/19/22 0736 01/19/22 1505   01/11/22 1400  erythromycin 250 mg in sodium chloride 0.9 % 100 mL IVPB  Status:  Discontinued        250 mg 100 mL/hr over 60 Minutes Intravenous Every 8 hours 01/11/22 1058 01/19/22 0736   12/27/21 2130  ceFAZolin (ANCEF) IVPB 2g/100 mL premix        2 g 200 mL/hr over 30 Minutes Intravenous  Once 12/27/21 2039 12/27/21 2138   12/23/21 1400  cefoTEtan (CEFOTAN) 2 g in sodium chloride 0.9 % 100 mL IVPB        2 g 200 mL/hr over 30 Minutes Intravenous 60 min pre-op 12/23/21 1040 12/23/21 1444        Assessment/Plan Sigmoid volvulus on admission imaging - operative findings of sigmoid obstruction due to adhesions -POD 28 s/p sigmoid colectomy, mobilization of splenic flexure Dr. Thermon Leyland  -POD 24 s/p exploratory laparotomy placement of retention sutures for fascial dehiscence Dr. 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 Pericolonic stranding of the splenic flexure and SB mesentery.  Repeat CT 12/24 without significant fluid collection/mostly resolved; persistent distention  of colon c/w adynamic ileus, no obstruction - repeat ct with rectal contrast 12/29 shows small bowel and colon with fluid and air indicative of ileus anastomosis is patent with no issues - Appreciate GI assistance with constipation/ colonic dysmotility. No results with linzess so they are trying Trilyte today and clear liquids.  - Mobilize, continue therapies - denied by CIR because he is doing too well. PT to see again today - pt needs to work on stairs. 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   FEN: CLD, bowel regimen VTE: LMWH ID: none currently    LOS: 29 days     Wellington Hampshire, [facility] Surgery 01/20/2022, 10:44 AM Please see Amion for pager number during day hours 7:00am-4:30pm

## 2022-01-20 NOTE — Progress Notes (Signed)
[facility] Progress Note  Nathaniel Hanson 42 y.o. 12-May-1980   Subjective: No BMs overnight. Feels it "bubbling." Denies abdominal pain. Felt nauseous after eating 3 solid trays yesterday. Lying in bed watching TV.  Objective: Vital signs: Vitals:   01/19/22 2020 01/20/22 0530  BP: 132/87 114/82  Pulse: (!) 109 92  Resp: 16 18  Temp: 98.1 F (36.7 C) 98.4 F (36.9 C)  SpO2: 97% 98%    Physical Exam: Gen: alert, no acute distress, well-nourished HEENT: anicteric sclera CV: RRR Chest: CTA B Abd: soft, nontender, nondistended, +BS, midline abdominal dressing Ext: no edema  Lab Results: Recent Labs    01/18/22 0441 01/20/22 0416  NA 132* 135  K 4.1 4.2  CL 100 101  CO2 27 26  GLUCOSE 113* 100*  BUN 16 15  CREATININE 0.82 0.98  CALCIUM 8.7* 9.0  MG 2.0  --   PHOS 4.4  --    Recent Labs    01/20/22 0416  AST 17  ALT 39  ALKPHOS 107  BILITOT 0.9  PROT 6.8  ALBUMIN 3.3*   No results for input(s): "WBC", "NEUTROABS", "HGB", "HCT", "MCV", "PLT" in the last 72 hours.    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 Linzess started yesterday without any BMs thus far. Recommended Trilyte yesterday but he did not want to start it. Strongly advised that he needs to drink Trilyte today and he is willing to drink 1/4 of the 1/2 gallon that I will order. Continue Linzess. Will change diet to clear liquids while on the Trilyte to avoid nausea/vomiting.   Nathaniel Hanson 01/20/2022, 10:20 AM  Questions please call [phone] ID: Nathaniel Hanson, male   DOB: 1980-05-16, 42 y.o.   MRN: [id number]

## 2022-01-20 NOTE — Progress Notes (Signed)
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 12/5 seen by GI General surgery, decompressed with sigmoidoscopy found to have relatively normal colon 12/7 sigmoid colectomy performed by general surgery 12/11 developed nausea vomiting bleeding from wound as well as disruption of midline sutures and underwent ex lap Developed persistent ileus there was some consideration for the patient also having intra-abdominal collection but this proved unfounded   Hospital-Problem based course  Sigmoid volvulus status post resection, revision of surgery as above -As per general surgery, wound seems to be healing slowly - Clear from their perspective for discharge once able  Ileus as well as slow bowel transit -Discontinued erythromycin - Patient declining to use Dulcolax 10 daily  -GI saw the patient/3 and started Linzess to 90 mcg, I have mentioned to him that if he does not pass his stool we will do the TriLyte half bottle to see how he does -Continue Reglan 10 3 times daily FiberCon 625 twice daily and senna 17 daily also will try milk of magnesium - Appreciate follow-up by gastroenterology  Glucose intolerance A1c 5.7 - Outpatient counseling  Syncope vasovagal suspected versus deconditioning - CR consulted and declined - He is walking 700 feet  Sinus tachycardia on admission with volume depletion - Stable  Mildly elevated LFTs - Likely secondary to fluid shifts etc. - Follow labs 1 more time    DVT prophylaxis: Lovenox Code Status: Full code confirmed  Family Communication: No family present today Disposition:  Status is: Inpatient Remains inpatient appropriate because:   Await stability on diet and passage of stool regularly    Subjective: Patient  is coherent awake alert Not in distress "I feel my stomach is bubbling" Overall no significant pain  Objective: Vitals:   01/19/22 0446 01/19/22 1447 01/19/22 2020 01/20/22 0530  BP: (!) 96/54 117/86 132/87 114/82  Pulse: 100 95 (!) 109 92  Resp: 18 18 16 18   Temp: 98 F (36.7 C) 98.1 F (36.7 C) 98.1 F (36.7 C) 98.4 F (36.9 C)  TempSrc: Oral Oral Oral Oral  SpO2: 94% 98% 97% 98%  Weight:      Height:        Intake/Output Summary (Last 24 hours) at 01/20/2022 0919 Last data filed at 01/20/2022 0534 Gross per 24 hour  Intake 420 ml  Output 1150 ml  Net -730 ml    Filed Weights   12/28/21 0915 01/11/22 0650 01/18/22 0602  Weight: 113.6 kg 110.2 kg 107.6 kg    Examination:  Flat affect Chest is clear no added sound no rales no rhonchi no wheeze Abdomen slightly distended, well-healed in the upper portion, wet-to-dry not removed from lower No lower extremity edema ROM intact  Data Reviewed: personally reviewed   CBC    Component Value Date/Time   WBC 9.9 01/14/2022 1010   RBC 4.43 01/14/2022 1010   HGB 12.5 (L) 01/14/2022 1010   HCT 38.2 (L) 01/14/2022 1010   PLT 312 01/14/2022 1010   MCV 86.2 01/14/2022 1010   MCH 28.2 01/14/2022 1010   MCHC 32.7 01/14/2022 1010   RDW 14.1 01/14/2022 1010   LYMPHSABS 1.0 12/30/2021 1238   MONOABS 0.8 12/30/2021 1238   EOSABS 0.2 12/30/2021 1238   BASOSABS 0.0 12/30/2021 1238      Latest Ref Rng &  Units 01/20/2022    4:16 AM 01/18/2022    4:41 AM 01/17/2022    2:09 AM  CMP  Glucose 70 - 99 mg/dL 100  113  105   BUN 6 - 20 mg/dL 15  16  17    Creatinine 0.61 - 1.24 mg/dL 0.98  0.82  0.81   Sodium 135 - 145 mmol/L 135  132  134   Potassium 3.5 - 5.1 mmol/L 4.2  4.1  4.1   Chloride 98 - 111 mmol/L 101  100  102   CO2 22 - 32 mmol/L 26  27  26    Calcium 8.9 - 10.3 mg/dL 9.0  8.7  8.7   Total Protein 6.5 - 8.1 g/dL 6.8   6.1   Total Bilirubin 0.3 - 1.2 mg/dL 0.9   0.8   Alkaline Phos 38 - 126 U/L 107   91   AST 15 - 41 U/L  17   29   ALT 0 - 44 U/L 39   64      Radiology Studies: No results found.   Scheduled Meds:  (feeding supplement) PROSource Plus  30 mL Oral TID BM   bisacodyl  10 mg Rectal Daily   Chlorhexidine Gluconate Cloth  6 each Topical Daily   enoxaparin (LOVENOX) injection  40 mg Subcutaneous Q24H   feeding supplement  1 Container Oral TID BM   insulin aspart  0-15 Units Subcutaneous Q6H   linaclotide  290 mcg Oral QAC breakfast   magnesium hydroxide  30 mL Oral Daily   methocarbamol  500 mg Oral TID   metoCLOPramide  10 mg Oral TID AC   multivitamin with minerals  1 tablet Oral Daily   polycarbophil  625 mg Oral BID   senna  2 tablet Oral Daily   sodium chloride flush  10-40 mL Intracatheter Q12H   sodium chloride flush  3 mL Intravenous Q12H   sorbitol, milk of mag, mineral oil, glycerin (SMOG) enema  200 mL Rectal Once   Continuous Infusions:  sodium chloride 10 mL/hr at 01/17/22 2237   promethazine (PHENERGAN) injection (IM or IVPB) Stopped (01/14/22 2059)   sodium chloride       LOS: 29 days   Time spent: Fowler, MD [facility] To contact the attending provider between 7A-7P or the covering provider during after hours 7P-7A, please log into the web site [email or web address] and access using universal [place] password for that web site. If you do not have the password, please call the hospital operator.  01/20/2022, 9:19 AM

## 2022-01-21 ENCOUNTER — Other Ambulatory Visit (HOSPITAL_COMMUNITY): Payer: Self-pay

## 2022-01-21 LAB — GLUCOSE, CAPILLARY
Glucose-Capillary: 102 mg/dL — ABNORMAL HIGH (ref 70–99)
Glucose-Capillary: 119 mg/dL — ABNORMAL HIGH (ref 70–99)

## 2022-01-21 MED ORDER — SENNA 8.6 MG PO TABS: 2.0000 | ORAL_TABLET | Freq: Every day | ORAL | 0 refills | Status: DC

## 2022-01-21 MED ORDER — SORBITOL 70 % PO SOLN: 15.0000 mL | Freq: Every day | ORAL | 0 refills | Status: DC | PRN

## 2022-01-21 MED ORDER — SENNA 8.6 MG PO TABS
2.0000 | ORAL_TABLET | Freq: Every day | ORAL | 0 refills | Status: AC
  Filled 2022-01-21: qty 120, 60d supply, fill #0

## 2022-01-21 MED ORDER — SORBITOL 70 % SOLN
15.0000 mL | Freq: Every day | 0 refills | Status: AC | PRN
  Filled 2022-01-21: qty 3840, 256d supply, fill #0
  Filled 2022-01-21: qty 450, 30d supply, fill #0

## 2022-01-21 NOTE — Progress Notes (Signed)
[facility] Progress Note  Nathaniel Hanson 42 y.o. [birth year]   Subjective: Reports soft and loose stool for 20 minutes yesterday evening after sipping on small amounts of Trilyte. Denies abdominal pain. Hungry. Sitting in bedside chair.  Objective: Vital signs: Vitals:   01/20/22 2240 01/21/22 0553  BP: 119/77 101/64  Pulse: 89 (!) 110  Resp: 18 18  Temp: 98.3 F (36.8 C) 98.3 F (36.8 C)  SpO2: 96% 96%    Physical Exam: Gen: alert, no acute distress, well-nourished HEENT: anicteric sclera CV: RRR Chest: CTA B Abd: soft, nontender, nondistended, +BS Ext: no edema  Lab Results: Recent Labs    01/20/22 0416  NA 135  K 4.2  CL 101  CO2 26  GLUCOSE 100*  BUN 15  CREATININE 0.98  CALCIUM 9.0   Recent Labs    01/20/22 0416  AST 17  ALT 39  ALKPHOS 107  BILITOT 0.9  PROT 6.8  ALBUMIN 3.3*   No results for input(s): "WBC", "NEUTROABS", "HGB", "HCT", "MCV", "PLT" in the last 72 hours.    Assessment/Plan: Constipation with history of colonic dysmotility and recent sigmoid colectomy for sigmoid volvulus - Large BM reported following small amount of Trilyte and use of Linzess. Due to lack of insurance Linzess not an option for him. Would do Senna and Sorbitol daily as needed. He did not tolerate Miralax. Ok to go home today from GI standpoint. F/U with GI prn. D/W Dr. Verlon Au.   Nathaniel Hanson 01/21/2022, 12:15 PM  Questions please call [phone] ID: Nathaniel Hanson, male   DOB: May 02, 1980, 42 y.o.   MRN: [id number]

## 2022-01-21 NOTE — Plan of Care (Signed)
Pt ready to DC home with family. Pt instructed on dressing changes to abdomen, proper handwashing technique prior to doing so, s/s to call MD for. See DC instructions.

## 2022-01-21 NOTE — Plan of Care (Signed)
  Problem: Clinical Measurements: Goal: Ability to maintain clinical measurements within normal limits will improve Outcome: Progressing   Problem: Activity: Goal: Ability to tolerate increased activity will improve Outcome: Progressing

## 2022-01-21 NOTE — TOC Transition Note (Addendum)
Transition of Care [facility]) - CM/SW Discharge Note   Patient Details  Name: Nathaniel Hanson MRN: [id number] Date of Birth: 20-Oct-1980  [facility] CM/SW Contact:  Lennart Pall, LCSW Phone Number: 01/21/2022, 2:20 PM   Clinical Narrative:     Pt medically cleared for dc today.  He plans to dc to his workplace- [employer] - who has a Therapist, sports / infirmary location where he can stay. PT recommended straight cane - ordered and delivered to room by [employer] today.  Pt has learned his wound care regimen as TOC unable to secure any HHRN coverage due to uninsured.  Pt placed into the [facility] med assist program and instructed to pick up meds at [facility] (RN also aware).  No further TOC needs.  Final next level of care: Home/Self Care Barriers to Discharge: Barriers Resolved   Patient Goals and CMS Choice      Discharge Placement                         Discharge Plan and Services Additional resources added to the After Visit Summary for                  DME Arranged: Kasandra Knudsen DME Agency: [employer] Date DME Agency Contacted: 01/21/22   Representative spoke with at DME Agency: [place] Determinants of Health ([place]) Interventions SDOH Screenings   Food Insecurity: No Food Insecurity (12/22/2021)  Housing: Low Risk  (12/22/2021)  Transportation Needs: No Transportation Needs (12/22/2021)  Utilities: Not At Risk (12/22/2021)  Tobacco Use: Low Risk  (01/03/2022)     Readmission Risk Interventions    01/07/2022   12:34 PM 12/23/2021    8:52 AM  Readmission Risk Prevention Plan  Post Dischage Appt Complete Complete  Medication Screening Complete Complete  Transportation Screening Complete Complete

## 2022-01-21 NOTE — Progress Notes (Signed)
Occupational Therapy Treatment Patient Details Name: Nathaniel Hanson MRN: [id number] DOB: 1980/12/25 Today's Date: 01/21/2022   History of present illness Pt is a 42 year old male who admitted from home 2* syncope causes by sigmoid volvulus.  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  Of note, pt experienced episode of syncope in hospital while in shower 01/10/22   OT comments  Patient was motivated to participate in the session. He was instructed on the use of AE for performing lower body dressing, specifically use of a reacher to doff socks, sock aid to donn socks, and reacher to donn underwear/pants. He was also educated on the use of a long-handled sponge for lower body bathing. He performed teach back seated EOB. He continues to make gradual functional progress.    Recommendations for follow up therapy are one component of a multi-disciplinary discharge planning process, led by the attending physician.  Recommendations may be updated based on patient status, additional functional criteria and insurance authorization.    Follow Up Recommendations  No OT follow up     Assistance Recommended at Discharge PRN  Patient can return home with the following  Assistance with cooking/housework   Equipment Recommendations  Other (comment) (reacher, sock aid, long-handled sponge)       Precautions / Restrictions Precautions Precaution Comments: abdominal binder in place Restrictions Weight Bearing Restrictions: No       Mobility Bed Mobility Overal bed mobility: Needs Assistance Bed Mobility: Rolling, Sidelying to Sit Rolling: Supervision Sidelying to sit: Supervision            Transfers Overall transfer level: Needs assistance Equipment used: None Transfers: Sit to/from Stand Sit to Stand: Supervision     Step pivot transfers: Supervision               ADL either performed or assessed with clinical judgement   ADL   Eating/Feeding:  Independent   Grooming: Set up;Sitting         Lower Body Bathing Details (indicate cue type and reason): OT educated the pt on the option for use of long handled sponge to perform lower body bathing, given abdominal precautions. Upper Body Dressing : Set up;Sitting   Lower Body Dressing: Minimal assistance;Bed level Lower Body Dressing Details (indicate cue type and reason): OT instructed the pt on use of AE to perform LBD, given abdominal precautions. Specifically, he was educated on use of a reacher to doff socks, sock aid to donn socks, and reacher to donn underwear/pants. He performed teach back seated EOB.                      Cognition Arousal/Alertness: Awake/alert Behavior During Therapy: WFL for tasks assessed/performed Overall Cognitive Status: Within Functional Limits for tasks assessed                        Pertinent Vitals/ Pain       Pain Assessment Pain Assessment: Faces Pain Score: 3  Pain Location: back "stiff" and abdominal "cramping" Pain Descriptors / Indicators: Sore, Grimacing Pain Intervention(s): Limited activity within patient's tolerance         Frequency  Min 2X/week        Progress Toward Goals  OT Goals(current goals can now be found in the care plan section)  Progress towards OT goals: Progressing toward goals  Acute Rehab OT Goals Patient Stated Goal: to discharge soon OT Goal Formulation: With  patient Time For Goal Achievement: 01/28/22 Potential to Achieve Goals: Good  Plan Discharge plan remains appropriate       AM-PAC OT "6 Clicks" Daily Activity     Outcome Measure   Help from another person eating meals?: None Help from another person taking care of personal grooming?: None Help from another person toileting, which includes using toliet, bedpan, or urinal?: A Little Help from another person bathing (including washing, rinsing, drying)?: A Little Help from another person to put on and taking off regular  upper body clothing?: None Help from another person to put on and taking off regular lower body clothing?: A Little 6 Click Score: 21    End of Session    OT Visit Diagnosis: Muscle weakness (generalized) (M62.81)   Activity Tolerance Patient tolerated treatment well   Patient Left in chair;with call bell/phone within reach   Nurse Communication Mobility status        Time: 4128-7867 OT Time Calculation (min): 20 min  Charges: OT General Charges $OT Visit: 1 Visit OT Treatments $Self Care/Home Management : 8-22 mins     Leota Sauers, OTR/L 01/21/2022, 11:01 AM

## 2022-01-21 NOTE — Discharge Summary (Signed)
Physician Discharge Summary  Nathaniel Hanson XAJ:[id number] DOB: [birth year] DOA: 12/21/2021  PCP: Creola Corn, MD  Admit date: 12/21/2021 Discharge date: 01/21/2022  Time spent: 36 minutes  Recommendations for Outpatient Follow-up:  Needs further surgical management of wound and wound dressings Recommend outpatient follow-up with general surgery Needs CBC Chem-12 in about 2 to 3 weeks  Discharge Diagnoses:  MAIN problem for hospitalization   Sigmoid volvulus status post resection Ileus Glucose intolerance A1c 5.7 Syncope  Please see below for itemized issues addressed in HOpsital- refer to other progress notes for clarity if needed  Discharge Condition: Improved  Diet recommendation: Heart healthy  Filed Weights   12/28/21 0915 01/11/22 0650 01/18/22 0602  Weight: 113.6 kg 110.2 kg 107.6 kg    History of present illness:  Brief Narrative:  42 yr old black male relatively healthy male, mild glucose intolerance A1c 5.7 Admitted 12/21/2021 with syncope abdominal pain Found to have sigmoid volvulus 12/5 seen by GI General surgery, decompressed with sigmoidoscopy found to have relatively normal colon 12/7 sigmoid colectomy performed by general surgery 12/11 developed nausea vomiting bleeding from wound as well as disruption of midline sutures and underwent ex lap Developed persistent ileus there was some consideration for the patient also having intra-abdominal collection but this proved unfounded     Hospital-Problem based course   Sigmoid volvulus status post resection, revision of surgery as above -As per general surgery, wound seems to be healing slowly - Clear from their perspective for discharge once able   Ileus as well as slow bowel transit -Discontinued erythromycin - Patient declining to use Dulcolax 10 daily  -GI continue to follow the patient and he was given various meds and ultimately discharged on sorbitol and senna after multiple trials of TriLyte as well as  Linzess and other's that were not feasible nor affordable   Glucose intolerance A1c 5.7 - Outpatient counseling   Syncope vasovagal suspected versus deconditioning - CR consulted and declined - He is walking 700 feet   Sinus tachycardia on admission with volume depletion - Stable at time of discharge   Mildly elevated LFTs - Likely secondary to fluid shifts etc. - Follow labs 1 more time    Discharge Exam: Vitals:   01/21/22 0553 01/21/22 1324  BP: 101/64 (!) 124/98  Pulse: (!) 110 (!) 105  Resp: 18 16  Temp: 98.3 F (36.8 C) (!) 97.4 F (36.3 C)  SpO2: 96% 97%    Subj on day of d/c Awake coherent no distress Comfortable appearing ambulatory No nausea no vomiting no fever passed good stool yesterday  Discharge Instructions   Discharge Instructions     Diet - low sodium heart healthy   Complete by: As directed    Discharge instructions   Complete by: As directed    Please make sure you take the sorbitol regularly and also the senna Follow-up with general surgery for further management You probably have some element of prediabetes and should watch your diet and get nutritional counseling Get labs in about a week   Discharge wound care:   Complete by: As directed    See surgery input   Increase activity slowly   Complete by: As directed       Allergies as of 01/21/2022   No Known Allergies      Medication List     STOP taking these medications    doxycycline 100 MG capsule Commonly known as: VIBRAMYCIN   ibuprofen 800 MG tablet Commonly known as: ADVIL  TAKE these medications    benzonatate 100 MG capsule Commonly known as: TESSALON Take 1 capsule (100 mg total) by mouth every 8 (eight) hours as needed for cough.   senna 8.6 MG Tabs tablet Commonly known as: SENOKOT Take 2 tablets (17.2 mg total) by mouth daily. Start taking on: January 22, 2022   sorbitol 70 % solution Take 15 mLs by mouth daily as needed.                Durable Medical Equipment  (From admission, onward)           Start     Ordered   01/20/22 1104  For home use only DME Cane  Once        01/20/22 1103              Discharge Care Instructions  (From admission, onward)           Start     Ordered   01/21/22 0000  Discharge wound care:       Comments: See surgery input   01/21/22 1324           No Known Allergies  Follow-up Information     Stechschulte, Hyman Hopes, MD. Go on 02/24/2022.   Specialty: Surgery Why: 8:45 AM, please arrive 30 min early to check in. Contact information: [address] [place] [address] [phone]                  The results of significant diagnostics from this hospitalization (including imaging, microbiology, ancillary and laboratory) are listed below for reference.    Significant Diagnostic Studies: CT ABDOMEN PELVIS W CONTRAST  Result Date: 01/14/2022 CLINICAL DATA:  Abdominal pain, postop colon resection with primary anastomosis. History of sigmoid volvulus. EXAM: CT ABDOMEN AND PELVIS WITH CONTRAST TECHNIQUE: Multidetector CT imaging of the abdomen and pelvis was performed using the standard protocol following bolus administration of intravenous contrast. RADIATION DOSE REDUCTION: This exam was performed according to the departmental dose-optimization program which includes automated exposure control, adjustment of the mA and/or kV according to patient size and/or use of iterative reconstruction technique. CONTRAST:  OMNIPAQUE IOHEXOL 300 MG/ML SOLN, OMNIPAQUE IOHEXOL 300 MG/ML SOLN COMPARISON:  01/09/2022. FINDINGS: Lower chest: Bibasilar atelectasis. Heart is enlarged. No pericardial or pleural effusion. Distal esophagus is grossly unremarkable. Hepatobiliary: Liver is unremarkable. There may be mild vicarious excretion of contrast in the gallbladder. No biliary ductal dilatation. Pancreas: Negative. Spleen: 11 mm low-attenuation lesion in  the inferior spleen, unchanged and too small characterize. Adrenals/Urinary Tract: Adrenal glands and kidneys are unremarkable. Ureters are decompressed. Bladder is relatively low in volume. Stomach/Bowel: Stomach is unremarkable. Mild distension of the small bowel and colon with fluid and air. Sigmoid anastomosis. No extraluminal air. Delayed imaging using large volume rectal contrast shows no leak. Vascular/Lymphatic: Vascular structures are unremarkable. No pathologically enlarged lymph nodes. Reproductive: Prostate is visualized. Other: Scattered free fluid. No free air. Mesenteries and peritoneum are otherwise unremarkable. Musculoskeletal: None. IMPRESSION: 1. Small bowel and colon are dilated with fluid and air, indicative of an ileus. 2. Sigmoid anastomosis without evidence of a contrast leak on delayed imaging. 3. Scattered ascites. Electronically Signed   By: Leanna Battles M.D.   On: 01/14/2022 15:04   CT ABDOMEN PELVIS W CONTRAST  Result Date: 01/09/2022 CLINICAL DATA:  Postop.  Intra-abdominal abscess. EXAM: CT ABDOMEN AND PELVIS WITH CONTRAST TECHNIQUE: Multidetector CT imaging of the abdomen and pelvis was performed using  the standard protocol following bolus administration of intravenous contrast. RADIATION DOSE REDUCTION: This exam was performed according to the departmental dose-optimization program which includes automated exposure control, adjustment of the mA and/or kV according to patient size and/or use of iterative reconstruction technique. CONTRAST:  126mL OMNIPAQUE IOHEXOL 300 MG/ML  SOLN COMPARISON:  01/03/2022.  12/21/2021. FINDINGS: Lower chest: Minor subsegmental atelectasis at the dependent lung bases, similar on the right, improved on the left compared to the prior exam. No acute findings. Hepatobiliary: No focal liver abnormality is seen. No gallstones, gallbladder wall thickening, or biliary dilatation. Pancreas: Unremarkable. No pancreatic ductal dilatation or surrounding  inflammatory changes. Spleen: Normal in size without focal abnormality. Adrenals/Urinary Tract: Adrenal glands are unremarkable. Kidneys are normal, without renal calculi, focal lesion, or hydronephrosis. Bladder is unremarkable. Stomach/Bowel: Colon is distended from the cecum to the rectosigmoid, maximum along the ascending colon, 7.5 cm. No colonic wall thickening. Colon and rectum show air-fluid levels. Sigmoid colon anastomosis staple line stable from the prior CT. Mild adjacent trace amount of adjacent fluid and fat stranding. Fluid noted in the pelvis on the prior CT has otherwise resolved. There is no defined collection on the current exam to suggest an abscess. No extraluminal or free air. Stomach is unremarkable. Small bowel is normal in caliber. No wall thickening or inflammation. Vascular/Lymphatic: No significant vascular findings are present. No enlarged abdominal or pelvic lymph nodes. Reproductive: Unremarkable. Other: Midline incision.  No dehiscence.  No hernia. Musculoskeletal: No fracture or acute finding.  No bone lesion. IMPRESSION: 1. Fluid noted in the pelvis on the previous CT has mostly resolved. There is trace fluid adjacent to the sigmoid colon at and just proximal to the anastomosis. No evidence of an abscess. No colonic wall thickening. 2. Diffuse colonic distension, with colonic and rectal air-fluid levels, consistent with a colonic adynamic ileus. No evidence of obstruction. 3. No extraluminal or free air. Electronically Signed   By: Lajean Manes M.D.   On: 01/09/2022 11:04   DG Abd Portable 2V  Result Date: 01/06/2022 CLINICAL DATA:  Status post abdominal surgery, follow-up exam EXAM: PORTABLE ABDOMEN - 2 VIEW COMPARISON:  CT 01/03/2022 FINDINGS: Nasogastric tube side port overlies the distal stomach, distal tip overlying the region of the proximal duodenum. There is mild gaseous distension of the colon. No evidence of bowel obstruction. Curvilinear bandlike opacity in the left  lung base with underlying lucency. IMPRESSION: Curvilinear bandlike opacity and lucency in the region of the left hemidiaphragm/left lung base, favored to represent left basilar atelectasis with superimposed aerated basilar lung, as seen on recent CT. If there are peritoneal signs or if there is clinical concern for perforation, would recommend a CT. Nasogastric tube side port overlies the distal stomach, tip overlying the proximal duodenum. No evidence of bowel obstruction.  Gaseous distention of the colon. Electronically Signed   By: Maurine Simmering M.D.   On: 01/06/2022 10:31   CT ABDOMEN PELVIS W CONTRAST  Result Date: 01/03/2022 CLINICAL DATA:  abdominal pain, postoperative assessment in a 42 year old male presenting with distal colonic narrowing. Post sigmoid resection also post LEFT thoracotomy for fascial dehiscence. EXAM: CT ABDOMEN AND PELVIS WITH CONTRAST TECHNIQUE: Multidetector CT imaging of the abdomen and pelvis was performed using the standard protocol following bolus administration of intravenous contrast. RADIATION DOSE REDUCTION: This exam was performed according to the departmental dose-optimization program which includes automated exposure control, adjustment of the mA and/or kV according to patient size and/or use of iterative reconstruction technique. CONTRAST:  100mL OMNIPAQUE IOHEXOL 300 MG/ML  SOLN COMPARISON:  Chest December 5th 2023 CT evaluation also FINDINGS: Lower chest: Basilar volume loss.  No pleural effusion. Hepatobiliary: No focal, suspicious hepatic lesion. No pericholecystic stranding. No biliary duct dilation. Portal vein is patent. Mild hepatic steatosis. Pancreas: Normal, without mass, inflammation or ductal dilatation. Spleen: Normal. Adrenals/Urinary Tract: Adrenal glands are unremarkable. Symmetric renal enhancement. No sign of hydronephrosis. No suspicious renal lesion or perinephric stranding. Urinary bladder is grossly unremarkable. Stomach/Bowel: Gastric tube in  place. Stomach largely decompressed. Small bowel without signs of obstruction. Mild stranding in the small bowel mesentery following recent surgery is nonspecific. No gross small bowel inflammation. Colon with distension proximal to the anastomotic site potentially due to mild ileus, not as dilated as on previous imaging. Appendix is normal. Anastomotic site in the LEFT upper pelvis. No adjacent gas. Scalloped margins of fluid in the pelvis raising the question of mild loculation but without peripheral enhancement. Density values less than 20 Hounsfield units. Density of 12 Hounsfield units. This fluid is increased over imaging from December 5th measuring 4.5 x 3.7 cm in the RIGHT pelvis flowing to the LEFT pelvis and up into the LEFT lower quadrant. Mild pericolonic stranding in under distension versus thickening of the splenic flexure of the colon. No signs of pneumoperitoneum. Vascular/Lymphatic: Aorta with smooth contours. IVC with smooth contours. No aneurysmal dilation of the abdominal aorta. There is no gastrohepatic or hepatoduodenal ligament lymphadenopathy. No retroperitoneal or mesenteric lymphadenopathy. No pelvic sidewall lymphadenopathy. Reproductive: Unremarkable by CT. Other: Fluid in the pelvis and LEFT lower quadrant. Musculoskeletal: Midline abdominal wound. Mild separation of rectus musculature near the umbilicus (image 72/2) no acute or destructive bone process. IMPRESSION: 1. Fluid potentially mildly loculated but without peripheral enhancement in the pelvis is nonspecific. Correlate with any signs of infection. This could also be due to underlying inflammation. 2. Mild pericolonic stranding in under distension versus thickening of the splenic flexure of the colon. Correlate with any signs of colitis. Would also correlate with lactate given location at the splenic flexure to exclude developing low flow ischemia or similar process. Findings are nonspecific and could also be related to recent  surgical manipulation. 3. Mild stranding in the small bowel mesentery following recent surgery is nonspecific. No gross small bowel inflammation. 4. Midline abdominal wound. Mild separation of rectus musculature near the umbilicu, s attention on follow-up. 5. Mild hepatic steatosis. Electronically Signed   By: Donzetta KohutGeoffrey  Wile M.D.   On: 01/03/2022 16:38   DG CHEST PORT 1 VIEW  Result Date: 01/03/2022 CLINICAL DATA:  Fever. EXAM: PORTABLE CHEST 1 VIEW COMPARISON:  12/21/2021 FINDINGS: Low volume film. Subsegmental atelectasis noted left base. Curvilinear opacity in the parahilar right lung is probably atelectatic. No focal consolidation or evidence of pulmonary edema. No substantial pleural effusion. Right PICC line and NG tube are new in the interval. IMPRESSION: 1. Low volume film with bilateral atelectasis. 2. New right PICC line and NG tube. Electronically Signed   By: Kennith CenterEric  Mansell M.D.   On: 01/03/2022 10:11   DG Abd Portable 1V  Result Date: 01/03/2022 CLINICAL DATA:  Nasogastric tube placement. EXAM: PORTABLE ABDOMEN - 1 VIEW COMPARISON:  Plain films of the abdomen dated 12/30/2021. FINDINGS: Nasogastric tube appears well positioned in the stomach with tip directed towards the stomach pylorus/duodenal bulb. The earlier small-bowel distention has resolved. Large bowel is of normal caliber. No evidence of free intraperitoneal air or abnormal fluid collection. No evidence of renal or ureteral stone. Visualized osseous  structures are unremarkable. IMPRESSION: Nasogastric tube appears well positioned in the stomach with tip directed towards the stomach pylorus/duodenal bulb. Electronically Signed   By: Franki Cabot M.D.   On: 01/03/2022 08:49   DG Abd Portable 1V  Result Date: 12/30/2021 CLINICAL DATA:  NG tube placement EXAM: PORTABLE ABDOMEN - 1 VIEW COMPARISON:  Same-day KUB FINDINGS: The enteric catheter is coiled in the stomach with the tip projecting over the expected location of the  pylorus/proximal duodenum. There is unchanged diffuse gaseous distention of the bowel. There is no definite free intraperitoneal air, within the confines of portable semi-erect technique. IMPRESSION: Enteric catheter coiled in the stomach with tip in the expected location of the pylorus/proximal duodenum. Electronically Signed   By: Valetta Mole M.D.   On: 12/30/2021 11:45   DG Abd Portable 1V  Result Date: 12/30/2021 CLINICAL DATA:  Ileus following gastrointestinal surgery EXAM: PORTABLE ABDOMEN - 1 VIEW COMPARISON:  12/27/2021 FINDINGS: Persistent diffuse gaseous distension of bowel in the abdomen, with increased small bowel distension in comparison to prior. Skin staples have been removed. IMPRESSION: Persistent diffuse gaseous distension of bowel, compatible with ileus, with increased small bowel distension in comparison to prior. Recommend continued follow-up. Electronically Signed   By: Maurine Simmering M.D.   On: 12/30/2021 09:07   [place] EKG SITE RITE  Result Date: 12/28/2021 If Site Rite image not attached, placement could not be confirmed due to current cardiac rhythm.  DG Abd Portable 1V  Result Date: 12/27/2021 CLINICAL DATA:  Ileus following gastrointestinal surgery (HCC) EXAM: PORTABLE ABDOMEN - 1 VIEW COMPARISON:  Radiograph 12/22/2021, CT 12/21/2021 FINDINGS: Recent midline skin staples. There is diffuse gaseous distention of bowel. IMPRESSION: Diffuse gaseous distention of bowel, compatible with postoperative ileus. Electronically Signed   By: Maurine Simmering M.D.   On: 12/27/2021 11:18   ECHOCARDIOGRAM COMPLETE  Result Date: 12/22/2021    ECHOCARDIOGRAM REPORT   Patient Name:   SHAMMOND ARAVE Date of Exam: 12/22/2021 Medical Rec #:  [id number]      Height:       73.0 in Accession #:    [id number]     Weight:       254.0 lb Date of Birth:  08/15/1980      BSA:          2.382 m Patient Age:    89 years       BP:           124/71 mmHg Patient Gender: M              HR:           75 bpm. Exam  Location:  Inpatient Procedure: 2D Echo, Cardiac Doppler and Color Doppler Indications:    Syncope  History:        Patient has no prior history of Echocardiogram examinations.                 Signs/Symptoms:Syncope.  Sonographer:    Wenda Low Referring Phys: [id number] [place] T TU IMPRESSIONS  1. Left ventricular ejection fraction, by estimation, is 55%. The left ventricle has normal function. The left ventricle has no regional wall motion abnormalities. Left ventricular diastolic parameters were normal.  2. Right ventricular systolic function is normal. The right ventricular size is normal. Tricuspid regurgitation signal is inadequate for assessing PA pressure.  3. The mitral valve is normal in structure. Trivial mitral valve regurgitation. No evidence of mitral stenosis.  4. The aortic valve is tricuspid. Aortic  valve regurgitation is not visualized. No aortic stenosis is present.  5. IVC not visualized. FINDINGS  Left Ventricle: Left ventricular ejection fraction, by estimation, is 55%. The left ventricle has normal function. The left ventricle has no regional wall motion abnormalities. The left ventricular internal cavity size was normal in size. There is no left ventricular hypertrophy. Left ventricular diastolic parameters were normal. Right Ventricle: The right ventricular size is normal. No increase in right ventricular wall thickness. Right ventricular systolic function is normal. Tricuspid regurgitation signal is inadequate for assessing PA pressure. Left Atrium: Left atrial size was normal in size. Right Atrium: Right atrial size was normal in size. Pericardium: There is no evidence of pericardial effusion. Mitral Valve: The mitral valve is normal in structure. Trivial mitral valve regurgitation. No evidence of mitral valve stenosis. MV peak gradient, 3.4 mmHg. The mean mitral valve gradient is 2.0 mmHg. Tricuspid Valve: The tricuspid valve is normal in structure. Tricuspid valve regurgitation is not  demonstrated. Aortic Valve: The aortic valve is tricuspid. Aortic valve regurgitation is not visualized. No aortic stenosis is present. Aortic valve mean gradient measures 3.0 mmHg. Aortic valve peak gradient measures 4.8 mmHg. Aortic valve area, by VTI measures 3.17 cm. Pulmonic Valve: The pulmonic valve was normal in structure. Pulmonic valve regurgitation is not visualized. Aorta: The aortic root is normal in size and structure. Venous: IVC not visualized. The inferior vena cava was not well visualized. IAS/Shunts: No atrial level shunt detected by color flow Doppler.  LEFT VENTRICLE PLAX 2D LVIDd:         4.40 cm   Diastology LVIDs:         2.80 cm   LV e' medial:    8.81 cm/s LV PW:         1.20 cm   LV E/e' medial:  9.5 LV IVS:        1.00 cm   LV e' lateral:   13.80 cm/s LVOT diam:     2.10 cm   LV E/e' lateral: 6.1 LV SV:         72 LV SV Index:   30 LVOT Area:     3.46 cm  RIGHT VENTRICLE RV Basal diam:  4.80 cm RV Mid diam:    3.90 cm RV S prime:     9.68 cm/s TAPSE (M-mode): 2.9 cm LEFT ATRIUM             Index        RIGHT ATRIUM           Index LA diam:        3.70 cm 1.55 cm/m   RA Area:     17.50 cm LA Vol (A2C):   90.2 ml 37.87 ml/m  RA Volume:   51.10 ml  21.46 ml/m LA Vol (A4C):   50.5 ml 21.20 ml/m LA Biplane Vol: 70.6 ml 29.64 ml/m  AORTIC VALVE                    PULMONIC VALVE AV Area (Vmax):    3.18 cm     PV Vmax:       1.22 m/s AV Area (Vmean):   2.80 cm     PV Peak grad:  6.0 mmHg AV Area (VTI):     3.17 cm AV Vmax:           110.00 cm/s AV Vmean:          80.900 cm/s AV VTI:  0.228 m AV Peak Grad:      4.8 mmHg AV Mean Grad:      3.0 mmHg LVOT Vmax:         101.00 cm/s LVOT Vmean:        65.400 cm/s LVOT VTI:          0.209 m LVOT/AV VTI ratio: 0.92  AORTA Ao Root diam: 3.20 cm MITRAL VALVE MV Area (PHT): 3.74 cm    SHUNTS MV Area VTI:   2.42 cm    Systemic VTI:  0.21 m MV Peak grad:  3.4 mmHg    Systemic Diam: 2.10 cm MV Mean grad:  2.0 mmHg MV Vmax:       0.92 m/s  MV Vmean:      59.9 cm/s MV Decel Time: 203 msec MV E velocity: 83.60 cm/s MV A velocity: 58.30 cm/s MV E/A ratio:  1.43 Dalton McleanMD Electronically signed by Wilfred Lacy Signature Date/Time: 12/22/2021/4:09:58 PM    Final     Microbiology: No results found for this or any previous visit (from the past 240 hour(s)).   Labs: Basic Metabolic Panel: Recent Labs  Lab 01/15/22 0411 01/16/22 0211 01/17/22 0209 01/18/22 0441 01/20/22 0416  NA 134* 135 134* 132* 135  K 4.0 3.8 4.1 4.1 4.2  CL 104 102 102 100 101  CO2 27 28 26 27 26   GLUCOSE 126* 105* 105* 113* 100*  BUN 16 14 17 16 15   CREATININE 0.75 0.76 0.81 0.82 0.98  CALCIUM 8.5* 8.7* 8.7* 8.7* 9.0  MG  --  1.9 2.0 2.0  --   PHOS  --  4.5 4.8* 4.4  --    Liver Function Tests: Recent Labs  Lab 01/17/22 0209 01/20/22 0416  AST 29 17  ALT 64* 39  ALKPHOS 91 107  BILITOT 0.8 0.9  PROT 6.1* 6.8  ALBUMIN 2.9* 3.3*   No results for input(s): "LIPASE", "AMYLASE" in the last 168 hours. No results for input(s): "AMMONIA" in the last 168 hours. CBC: No results for input(s): "WBC", "NEUTROABS", "HGB", "HCT", "MCV", "PLT" in the last 168 hours. Cardiac Enzymes: No results for input(s): "CKTOTAL", "CKMB", "CKMBINDEX", "TROPONINI" in the last 168 hours. BNP: BNP (last 3 results) No results for input(s): "BNP" in the last 8760 hours.  ProBNP (last 3 results) No results for input(s): "PROBNP" in the last 8760 hours.  CBG: Recent Labs  Lab 01/20/22 1119 01/20/22 1717 01/20/22 2342 01/21/22 0603 01/21/22 1159  GLUCAP 115* 92 87 102* 119*       Signed:  03/22/22 MD   [facility] 01/21/2022, 1:25 PM

## 2022-01-21 NOTE — Progress Notes (Signed)
[facility] Progress Note  25 Days Post-Op  Subjective: CC-  States that he had a large BM yesterday. Not really passing flatus but denies n/v. Tolerating clear liquids.  Objective: Vital signs in last 24 hours: Temp:  [97.9 F (36.6 C)-98.3 F (36.8 C)] 98.3 F (36.8 C) (01/05 0553) Pulse Rate:  [89-110] 110 (01/05 0553) Resp:  [18] 18 (01/05 0553) BP: (101-119)/(64-94) 101/64 (01/05 0553) SpO2:  [96 %-98 %] 96 % (01/05 0553) Last BM Date : 01/20/22 (per pt)  Intake/Output from previous day: 01/04 0701 - 01/05 0700 In: 493.2 [P.O.:220; I.V.:273.2] Out: 550 [Urine:550] Intake/Output this shift: No intake/output data recorded.  PE: General: Alert, NAD Lungs:  Respiratory effort nonlabored on room air Abd: soft, NT, nondistended, midline wound with healthy granulation tissue  Psych: A&Ox3 with a flat affect  Lab Results:  No results for input(s): "WBC", "HGB", "HCT", "PLT" in the last 72 hours. BMET Recent Labs    01/20/22 0416  NA 135  K 4.2  CL 101  CO2 26  GLUCOSE 100*  BUN 15  CREATININE 0.98  CALCIUM 9.0   PT/INR No results for input(s): "LABPROT", "INR" in the last 72 hours. CMP     Component Value Date/Time   NA 135 01/20/2022 0416   K 4.2 01/20/2022 0416   CL 101 01/20/2022 0416   CO2 26 01/20/2022 0416   GLUCOSE 100 (H) 01/20/2022 0416   BUN 15 01/20/2022 0416   CREATININE 0.98 01/20/2022 0416   CALCIUM 9.0 01/20/2022 0416   PROT 6.8 01/20/2022 0416   ALBUMIN 3.3 (L) 01/20/2022 0416   AST 17 01/20/2022 0416   ALT 39 01/20/2022 0416   ALKPHOS 107 01/20/2022 0416   BILITOT 0.9 01/20/2022 0416   GFRNONAA >60 01/20/2022 0416   Lipase  No results found for: "LIPASE"     Studies/Results: No results found.  Anti-infectives: Anti-infectives (From admission, onward)    Start     Dose/Rate Route Frequency Ordered Stop   01/19/22 1400  erythromycin (E-MYCIN) tablet 250 mg  Status:  Discontinued        250 mg Oral Every 8  hours 01/19/22 0736 01/19/22 1505   01/11/22 1400  erythromycin 250 mg in sodium chloride 0.9 % 100 mL IVPB  Status:  Discontinued        250 mg 100 mL/hr over 60 Minutes Intravenous Every 8 hours 01/11/22 1058 01/19/22 0736   12/27/21 2130  ceFAZolin (ANCEF) IVPB 2g/100 mL premix        2 g 200 mL/hr over 30 Minutes Intravenous  Once 12/27/21 2039 12/27/21 2138   12/23/21 1400  cefoTEtan (CEFOTAN) 2 g in sodium chloride 0.9 % 100 mL IVPB        2 g 200 mL/hr over 30 Minutes Intravenous 60 min pre-op 12/23/21 1040 12/23/21 1444        Assessment/Plan 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 Pericolonic stranding of the splenic flexure and SB mesentery.  Repeat CT 12/24 without significant fluid collection/mostly resolved; persistent distention of colon c/w adynamic ileus, no obstruction - repeat ct with rectal contrast  12/29 shows small bowel and colon with fluid and air indicative of ileus anastomosis is patent with no issues - Appreciate GI assistance with constipation/ chronic colonic dysmotility. Patient had a good bowel movement yesterday after Trilyte. He is also still on linzess. Await GI recs for bowel regimen going forward and diet advancement.  Patient doing well from surgery. I think he could go home later today from our standpoint, but await GI recs. Discharge instructions and follow up info on AVS. He is not taking any narcotic pain medication so I will not send a rx. I updated the patient's aunt on the phone.   FEN: CLD, bowel regimen VTE:  LMWH ID: none currently    LOS: 30 days    Wellington [place], [facility] 01/21/2022, 9:09 AM Please see Amion for pager number during day hours 7:00am-4:30pm

## 2023-11-04 IMAGING — DX DG CHEST 2V
2 series · 2 of 2 positions shown · non-contrast
Comparison: 02/03/2021

CLINICAL DATA: Left chest pain 1 month

EXAM:
CHEST - 2 VIEW

[chest pa]
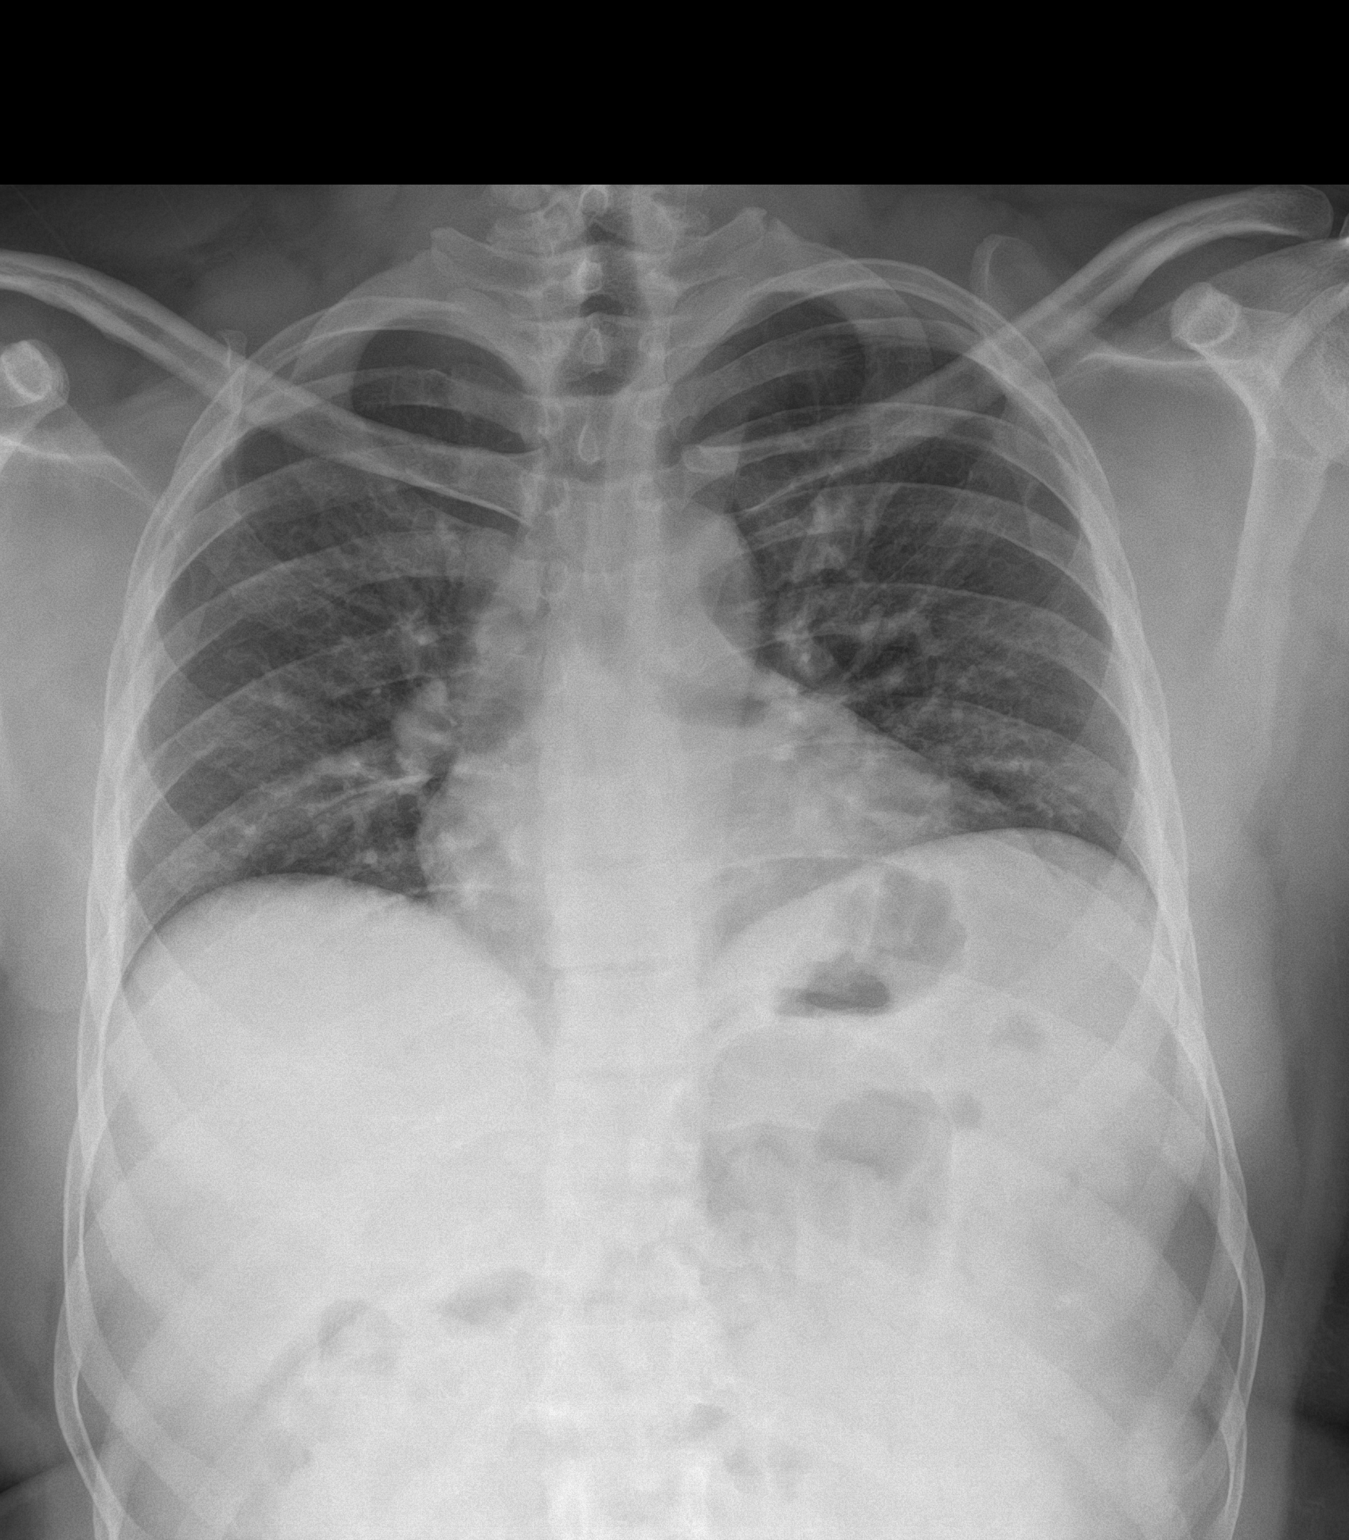

[chest lat]
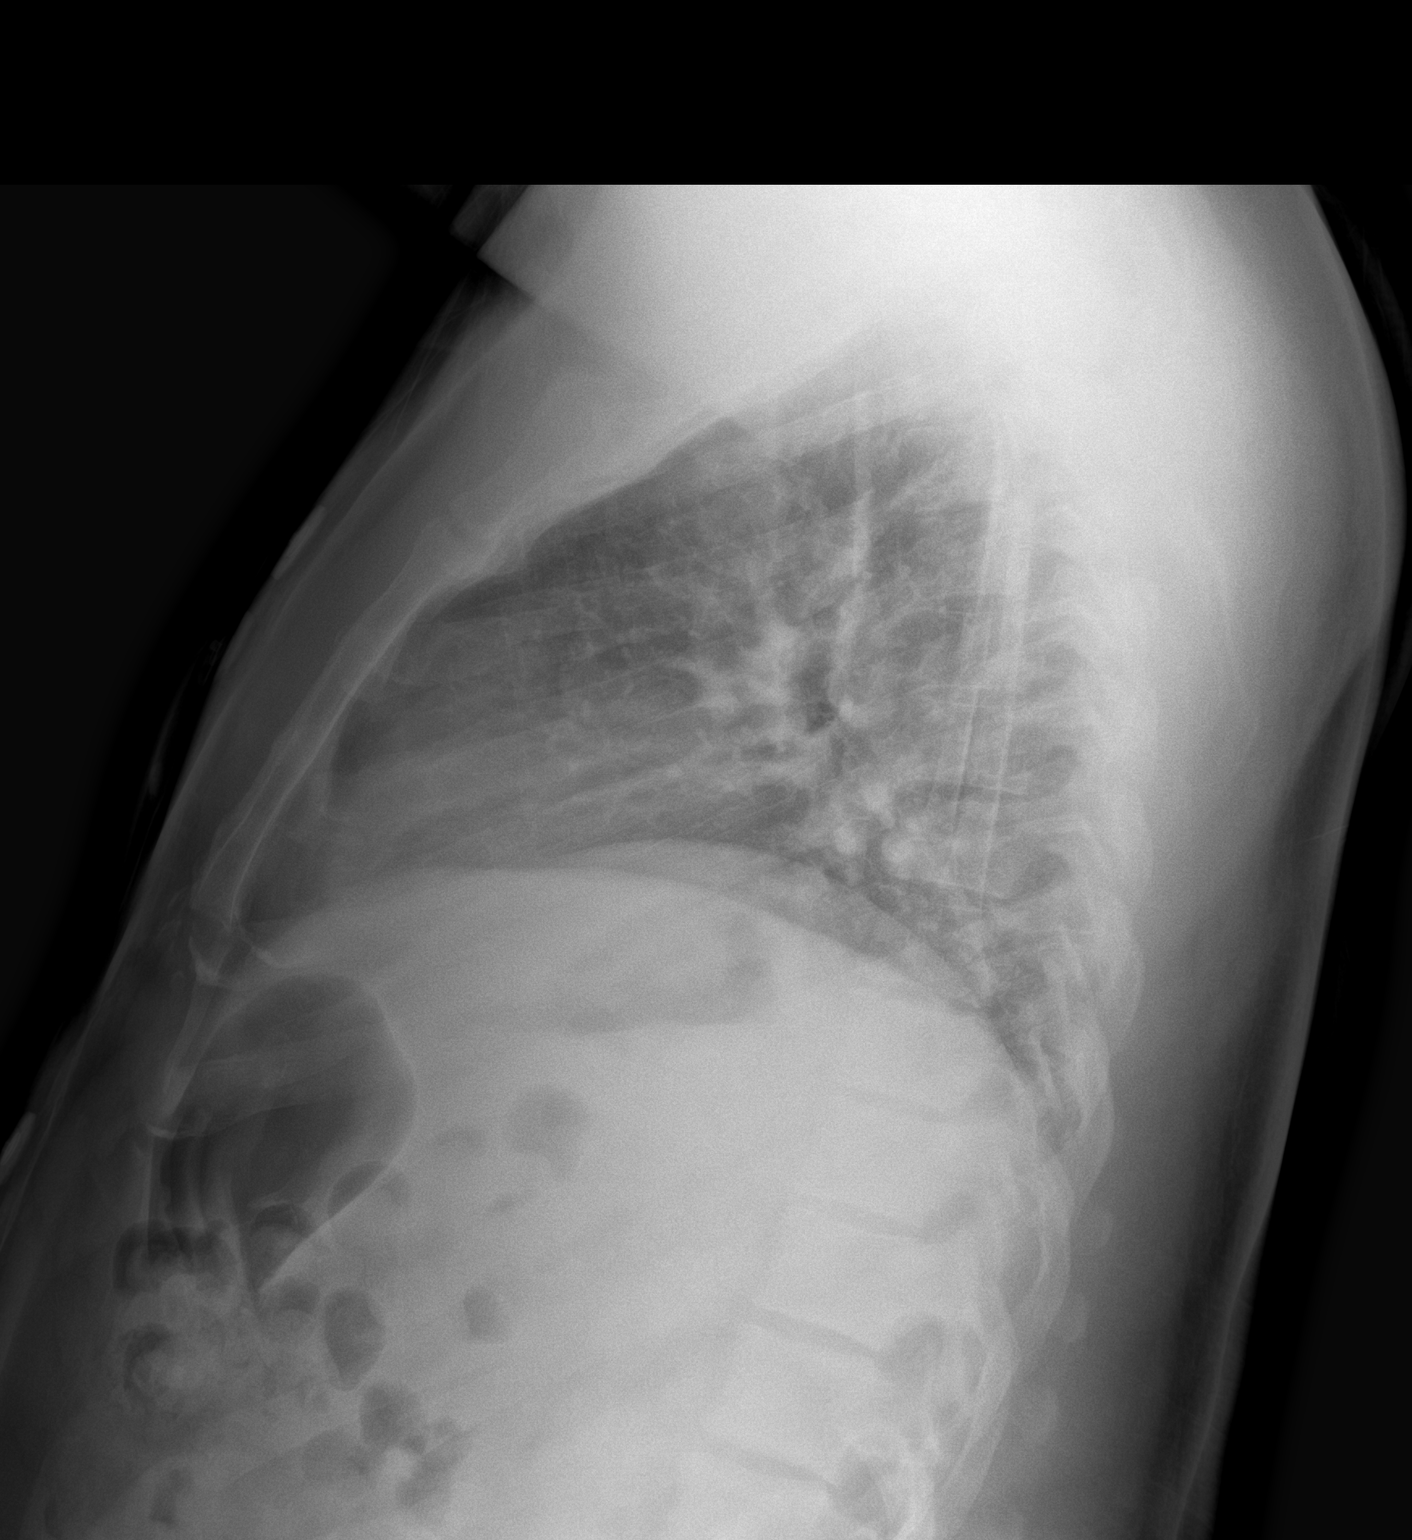

[2 of 2 positions shown; findings below may reference images not displayed]

FINDINGS: Mild left lower lobe airspace disease unchanged, favor atelectasis.
Right lung clear

Negative for heart failure or edema.  No pleural effusion.
IMPRESSION: Mild left lower lobe atelectasis unchanged.
